# Patient Record
Sex: Male | Born: 1940 | Race: White | Hispanic: No | Marital: Married | Smoking: Current every day smoker
Health system: Southern US, Community
[De-identification: ages and names within clinical notes are randomized; demographics above are authoritative.]

## PROBLEM LIST (undated history)

## (undated) DIAGNOSIS — IMO0001 Reserved for inherently not codable concepts without codable children: Secondary | ICD-10-CM

## (undated) DIAGNOSIS — I10 Essential (primary) hypertension: Secondary | ICD-10-CM

## (undated) DIAGNOSIS — J449 Chronic obstructive pulmonary disease, unspecified: Secondary | ICD-10-CM

## (undated) DIAGNOSIS — C801 Malignant (primary) neoplasm, unspecified: Secondary | ICD-10-CM

## (undated) DIAGNOSIS — E119 Type 2 diabetes mellitus without complications: Secondary | ICD-10-CM

## (undated) HISTORY — PX: COLON SURGERY: SHX602

## (undated) HISTORY — PX: HERNIA MESH REMOVAL: SHX1745

---

## 2004-02-18 ENCOUNTER — Other Ambulatory Visit: Payer: Self-pay

## 2004-05-19 ENCOUNTER — Ambulatory Visit: Payer: Self-pay | Admitting: Internal Medicine

## 2004-05-20 ENCOUNTER — Ambulatory Visit: Payer: Self-pay | Admitting: Internal Medicine

## 2004-05-23 ENCOUNTER — Ambulatory Visit: Payer: Self-pay | Admitting: Internal Medicine

## 2009-12-08 ENCOUNTER — Ambulatory Visit: Payer: Self-pay | Admitting: Surgery

## 2009-12-13 ENCOUNTER — Ambulatory Visit: Payer: Self-pay | Admitting: Surgery

## 2012-10-20 LAB — URINALYSIS, COMPLETE
Bilirubin,UR: NEGATIVE
Glucose,UR: >=500 mg/dL (ref 0–75)
Nitrite: NEGATIVE
Ph: 6 (ref 4.5–8.0)
RBC,UR: 5 /HPF (ref 0–5)
Specific Gravity: 1.023 (ref 1.003–1.030)
WBC UR: 2 /HPF (ref 0–5)

## 2012-10-20 LAB — CBC
HGB: 16.2 g/dL (ref 13.0–18.0)
MCH: 34.6 pg — ABNORMAL HIGH (ref 26.0–34.0)
MCHC: 34.6 g/dL (ref 32.0–36.0)
MCV: 100 fL (ref 80–100)
Platelet: 298 10*3/uL (ref 150–440)
RBC: 4.68 10*6/uL (ref 4.40–5.90)
RDW: 13 % (ref 11.5–14.5)
WBC: 19.2 10*3/uL — ABNORMAL HIGH (ref 3.8–10.6)

## 2012-10-20 LAB — COMPREHENSIVE METABOLIC PANEL
BUN: 20 mg/dL — ABNORMAL HIGH (ref 7–18)
Bilirubin,Total: 0.9 mg/dL (ref 0.2–1.0)
Creatinine: 1.02 mg/dL (ref 0.60–1.30)
Glucose: 220 mg/dL — ABNORMAL HIGH (ref 65–99)
Osmolality: 270 (ref 275–301)
SGOT(AST): 20 U/L (ref 15–37)
SGPT (ALT): 24 U/L (ref 12–78)

## 2012-10-20 LAB — LIPASE, BLOOD: Lipase: 129 U/L (ref 73–393)

## 2012-10-21 ENCOUNTER — Inpatient Hospital Stay: Payer: Self-pay | Admitting: Internal Medicine

## 2012-10-21 LAB — CBC WITH DIFFERENTIAL/PLATELET
Basophil #: 0 10*3/uL (ref 0.0–0.1)
Basophil %: 0.2 %
Eosinophil #: 0 10*3/uL (ref 0.0–0.7)
Eosinophil %: 0 %
HCT: 42.5 % (ref 40.0–52.0)
HGB: 14.7 g/dL (ref 13.0–18.0)
Lymphocyte #: 1.5 10*3/uL (ref 1.0–3.6)
Lymphocyte %: 9 %
MCHC: 34.6 g/dL (ref 32.0–36.0)
Monocyte #: 1.7 x10 3/mm — ABNORMAL HIGH (ref 0.2–1.0)
Monocyte %: 9.8 %
RBC: 4.25 10*6/uL — ABNORMAL LOW (ref 4.40–5.90)

## 2012-10-21 LAB — BASIC METABOLIC PANEL
Anion Gap: 6 — ABNORMAL LOW (ref 7–16)
BUN: 20 mg/dL — ABNORMAL HIGH (ref 7–18)
Calcium, Total: 8.6 mg/dL (ref 8.5–10.1)
Chloride: 98 mmol/L (ref 98–107)
Co2: 29 mmol/L (ref 21–32)
EGFR (African American): >60
EGFR (Non-African Amer.): >60
Glucose: 179 mg/dL — ABNORMAL HIGH (ref 65–99)
Osmolality: 273 (ref 275–301)
Sodium: 133 mmol/L — ABNORMAL LOW (ref 136–145)

## 2012-10-21 LAB — PROTIME-INR: Prothrombin Time: 14.9 secs — ABNORMAL HIGH (ref 11.5–14.7)

## 2012-10-22 LAB — COMPREHENSIVE METABOLIC PANEL
Albumin: 3.5 g/dL (ref 3.4–5.0)
Alkaline Phosphatase: 65 U/L (ref 50–136)
Anion Gap: 9 (ref 7–16)
BUN: 16 mg/dL (ref 7–18)
Bilirubin,Total: 1.5 mg/dL — ABNORMAL HIGH (ref 0.2–1.0)
Calcium, Total: 8.5 mg/dL (ref 8.5–10.1)
Co2: 27 mmol/L (ref 21–32)
EGFR (African American): >60
EGFR (Non-African Amer.): >60
Osmolality: 269 (ref 275–301)
Potassium: 3.3 mmol/L — ABNORMAL LOW (ref 3.5–5.1)
SGOT(AST): 134 U/L — ABNORMAL HIGH (ref 15–37)
SGPT (ALT): 202 U/L — ABNORMAL HIGH (ref 12–78)
Total Protein: 6.7 g/dL (ref 6.4–8.2)

## 2012-10-22 LAB — CBC WITH DIFFERENTIAL/PLATELET
Basophil %: 0.3 %
Eosinophil %: 0.4 %
HGB: 13.6 g/dL (ref 13.0–18.0)
Lymphocyte #: 1 10*3/uL (ref 1.0–3.6)
MCV: 100 fL (ref 80–100)
Monocyte #: 1.2 x10 3/mm — ABNORMAL HIGH (ref 0.2–1.0)
Monocyte %: 13.6 %
Neutrophil %: 74.4 %
Platelet: 204 10*3/uL (ref 150–440)
RDW: 12.7 % (ref 11.5–14.5)

## 2012-10-24 LAB — CBC WITH DIFFERENTIAL/PLATELET
Basophil #: 0 10*3/uL (ref 0.0–0.1)
Basophil %: 0.2 %
Eosinophil #: 0 10*3/uL (ref 0.0–0.7)
Eosinophil %: 0.1 %
HGB: 14.5 g/dL (ref 13.0–18.0)
Lymphocyte %: 8.9 %
MCH: 33.7 pg (ref 26.0–34.0)
MCHC: 33.5 g/dL (ref 32.0–36.0)
MCV: 101 fL — ABNORMAL HIGH (ref 80–100)
Monocyte #: 0.9 x10 3/mm (ref 0.2–1.0)
Monocyte %: 7.9 %
Neutrophil #: 10 10*3/uL — ABNORMAL HIGH (ref 1.4–6.5)
RDW: 12.6 % (ref 11.5–14.5)
WBC: 12 10*3/uL — ABNORMAL HIGH (ref 3.8–10.6)

## 2012-10-24 LAB — BASIC METABOLIC PANEL
Anion Gap: 7 (ref 7–16)
BUN: 16 mg/dL (ref 7–18)
Chloride: 94 mmol/L — ABNORMAL LOW (ref 98–107)
Co2: 29 mmol/L (ref 21–32)
EGFR (African American): >60
EGFR (Non-African Amer.): >60
Glucose: 186 mg/dL — ABNORMAL HIGH (ref 65–99)
Osmolality: 267 (ref 275–301)
Potassium: 3.6 mmol/L (ref 3.5–5.1)
Sodium: 130 mmol/L — ABNORMAL LOW (ref 136–145)

## 2012-10-25 LAB — CBC WITH DIFFERENTIAL/PLATELET
HCT: 33.7 % — ABNORMAL LOW (ref 40.0–52.0)
MCHC: 33.9 g/dL (ref 32.0–36.0)
Monocyte #: 1.1 x10 3/mm — ABNORMAL HIGH (ref 0.2–1.0)
Neutrophil #: 7.9 10*3/uL — ABNORMAL HIGH (ref 1.4–6.5)
Platelet: 172 10*3/uL (ref 150–440)
RDW: 12.7 % (ref 11.5–14.5)
WBC: 10.3 10*3/uL (ref 3.8–10.6)

## 2012-10-25 LAB — BASIC METABOLIC PANEL
Chloride: 99 mmol/L (ref 98–107)
Creatinine: 1.29 mg/dL (ref 0.60–1.30)
Osmolality: 273 (ref 275–301)
Potassium: 3.6 mmol/L (ref 3.5–5.1)

## 2012-10-26 LAB — BASIC METABOLIC PANEL
Anion Gap: 6 — ABNORMAL LOW (ref 7–16)
Chloride: 99 mmol/L (ref 98–107)
EGFR (Non-African Amer.): >60
Glucose: 180 mg/dL — ABNORMAL HIGH (ref 65–99)
Potassium: 3.5 mmol/L (ref 3.5–5.1)
Sodium: 132 mmol/L — ABNORMAL LOW (ref 136–145)

## 2012-10-26 LAB — CBC WITH DIFFERENTIAL/PLATELET
Basophil #: 0 10*3/uL (ref 0.0–0.1)
Eosinophil #: 0.2 10*3/uL (ref 0.0–0.7)
HCT: 33.8 % — ABNORMAL LOW (ref 40.0–52.0)
HGB: 11.6 g/dL — ABNORMAL LOW (ref 13.0–18.0)
Lymphocyte %: 10.4 %
MCV: 100 fL (ref 80–100)
Monocyte %: 9.4 %
Neutrophil #: 8.2 10*3/uL — ABNORMAL HIGH (ref 1.4–6.5)
RBC: 3.38 10*6/uL — ABNORMAL LOW (ref 4.40–5.90)

## 2012-10-27 LAB — CBC WITH DIFFERENTIAL/PLATELET
Basophil #: 0 10*3/uL (ref 0.0–0.1)
Basophil %: 0.4 %
Eosinophil #: 0.1 10*3/uL (ref 0.0–0.7)
HCT: 31.9 % — ABNORMAL LOW (ref 40.0–52.0)
HGB: 10.9 g/dL — ABNORMAL LOW (ref 13.0–18.0)
Lymphocyte %: 16.4 %
MCHC: 34.3 g/dL (ref 32.0–36.0)
Monocyte #: 1 x10 3/mm (ref 0.2–1.0)
Monocyte %: 14 %
Neutrophil #: 5.1 10*3/uL (ref 1.4–6.5)
Neutrophil %: 68 %
Platelet: 191 10*3/uL (ref 150–440)
RBC: 3.22 10*6/uL — ABNORMAL LOW (ref 4.40–5.90)
RDW: 12.3 % (ref 11.5–14.5)
WBC: 7.5 10*3/uL (ref 3.8–10.6)

## 2012-10-27 LAB — BASIC METABOLIC PANEL
Anion Gap: 8 (ref 7–16)
Calcium, Total: 8 mg/dL — ABNORMAL LOW (ref 8.5–10.1)
Chloride: 94 mmol/L — ABNORMAL LOW (ref 98–107)
Glucose: 157 mg/dL — ABNORMAL HIGH (ref 65–99)
Osmolality: 265 (ref 275–301)
Potassium: 3.1 mmol/L — ABNORMAL LOW (ref 3.5–5.1)
Sodium: 131 mmol/L — ABNORMAL LOW (ref 136–145)

## 2013-04-09 ENCOUNTER — Ambulatory Visit: Payer: Self-pay | Admitting: Gastroenterology

## 2013-04-10 LAB — PATHOLOGY REPORT

## 2013-12-27 DIAGNOSIS — I10 Essential (primary) hypertension: Secondary | ICD-10-CM | POA: Diagnosis present

## 2013-12-27 DIAGNOSIS — J449 Chronic obstructive pulmonary disease, unspecified: Secondary | ICD-10-CM | POA: Diagnosis present

## 2013-12-27 DIAGNOSIS — E785 Hyperlipidemia, unspecified: Secondary | ICD-10-CM

## 2013-12-27 DIAGNOSIS — N181 Chronic kidney disease, stage 1: Secondary | ICD-10-CM

## 2013-12-27 DIAGNOSIS — E1169 Type 2 diabetes mellitus with other specified complication: Secondary | ICD-10-CM | POA: Diagnosis present

## 2013-12-27 DIAGNOSIS — E1122 Type 2 diabetes mellitus with diabetic chronic kidney disease: Secondary | ICD-10-CM | POA: Diagnosis present

## 2014-10-07 DIAGNOSIS — D510 Vitamin B12 deficiency anemia due to intrinsic factor deficiency: Secondary | ICD-10-CM | POA: Insufficient documentation

## 2014-11-13 NOTE — H&P (Signed)
PATIENT NAME:  Bryan Hicks, Bryan Hicks MR#:  [id number] DATE OF BIRTH:  1940-08-13  DATE OF ADMISSION:  10/21/2012  ATTENDING PHYSICIAN:  Dr. Marlyce Huge.  REASON FOR ADMISSION: Abdominal pain, nausea, and vomiting.   HISTORY OF PRESENT ILLNESS: The patient is a pleasant 74 year old male with a history of laparoscopic right inguinal hernia repair using a TAPP approach who presents with 2 days of diffuse abdominal pain. He says it began suddenly 2 days ago. He describes it as stabbing. He says that it has gotten worse, and nothing has made it better. Currently, however, he is having  1 out of 10 pain due to the fact that he got pain medicine. He also reports having 8 episodes of  nausea and vomiting; none since he has been here. His last BM was yesterday, which was normal. He also says he has subjective fevers. Has never had pain like this before. He does not feel that the pain is due to his vomiting, but did start around that time. Does not report any obvious right pain or obvious bulges. No chills, night sweats, shortness of breath, cough, chest pain, previous constipation, diarrhea, sick contacts, unusual ingestions. Did have what he said was a "regular colonoscopy" a couple of years ago.   PAST MEDICAL HISTORY: 1.  Hypertension.  2.  Diabetes mellitus.  3.  COPD.  4.  History of laparoscopic right inguinal hernia and umbilical hernia repair.   HOME MEDICINES: Are as follows: 1.  Metformin 1500 at bedtime.  2.  Lovastatin 20 mg p.o. daily.  3.  Lisinopril 80 mg p.o. at bedtime.  4.  Aspirin 81 daily.  5.  Advair Diskus 250/50 inhalation powder, 1 puff b.i.d.   ALLERGIES: BEE STINGS.   SOCIAL HISTORY: Smokes approximately 15 cigarettes a day. Has smoked since he was a young child. Does not drink. Is here with his wife and stepdaughter. Lives in [place].   FAMILY HISTORY: Denies family history of heart disease, diabetes, high blood pressure. Mother died of colon cancer at age 13.    REVIEW OF SYSTEMS:  Twelve-point review of systems review of systems was obtained. Pertinent positives and negatives as above.   PHYSICAL EXAMINATION: VITAL SIGNS: Temperature 98.7, pulse 80, blood pressure 180/84, respirations 18; 95% on room air.  GENERAL: No acute distress, alert and oriented x 3.  HEAD: Normocephalic, atraumatic.  EYES: No scleral icterus. No conjunctivitis.  FACE: No obvious facial trauma. Normal external nose. Normal external ears.  CHEST: Lungs clear to auscultation. Some expiratory wheezes. Moving air well.  HEART: Regular rate and rhythm. No murmurs, rubs, or gallops.  ABDOMEN: Soft. Mild tender, not distended per him, but I feel he is a little distended, as does his wife.  EXTREMITIES: Moves all extremities well. Strength 5/5.  NEUROLOGIC: Cranial nerves II-XII grossly intact. Sensation intact, all 4 extremities.   LABS: Are significant for a blood glucose of 220, sodium of 130, chloride of 95, bicarbonate of 25. LFTs are normal   White cell count is 19.2.   Urinalysis is negative for nitrites and leukocyte esterase. INR is 1.2. Lactic acid is 1.6.   CT abdomen shows no oral contrast was given. Does have dilated proximal loops of small bowel and distal collapse, per radiologist. Appears a change in caliber occurs in the pelvis.   ASSESSMENT AND PLAN: The patient is a pleasant 74 year old who presents with abdominal pain, nausea, vomiting and dilation on CT scan. He does not have a lactic acidosis, but his white  cell count is increased, likely a small bowel obstruction. He is relatively nontender and vital signs are normal at this time. Will admit for resuscitation, nasogastric decompression for nonoperative management of bowel obstruction. Will perform serial abdominal exams. No obvious emergent need for surgical intervention at this time, however I am concerned about leukocytosis.    ____________________________ Glena Norfolk Ryka Beighley,  MD cal:dm D: 10/21/2012 02:58:00 ET T: 10/21/2012 07:52:33 ET JOB#: [id number]  cc: Harrell Gave A. Johnathon Olden, MD, <Dictator> Floyde Parkins MD ELECTRONICALLY SIGNED 10/28/2012 10:02

## 2014-11-13 NOTE — Discharge Summary (Signed)
PATIENT NAME:  Bryan Hicks, Bryan Hicks MR#:  [id number] DATE OF BIRTH:  03-03-1941  DATE OF ADMISSION:  10/21/2012 DATE OF DISCHARGE:  10/29/2012  DIAGNOSES: Small bowel obstruction, hypertension, diabetes, chronic obstructive pulmonary disease.   CONSULTANTS: None.   PROCEDURES: Exploratory laparotomy and lysis of adhesions for small bowel obstruction.   HISTORY OF PRESENT ILLNESS AND HOSPITAL COURSE: This is a patient who was admitted to the hospital by Dr. Rexene Edison with a diagnosis of small bowel obstruction. He was treated conservatively with nasogastric tube and rehydration. However, he failed to progress and was ultimately taken to the Operating Room where lysis of adhesions was performed for a partial small bowel obstruction, which promptly resolved. His nasogastric tube was removed and he was discharged in stable condition on his regular home medications and analgesics to follow up in my office in 10 days.    ____________________________ Bryan Banana Burt Knack, MD rec:jm D: 11/06/2012 20:42:43 ET T: 11/06/2012 21:35:45 ET JOB#: [id number]  cc: Bryan Banana. Burt Knack, MD, <Dictator> Florene Glen MD ELECTRONICALLY SIGNED 11/06/2012 22:38

## 2014-11-13 NOTE — Op Note (Signed)
PATIENT NAME:  Bryan Hicks, Bryan Hicks MR#:  [id number] DATE OF BIRTH:  March 15, 1941  DATE OF PROCEDURE:  10/23/2012  PREOPERATIVE DIAGNOSIS: Small bowel obstruction.   POSTOPERATIVE DIAGNOSIS:   Small bowel obstruction.  PROCEDURE:  1.  Exploratory laparotomy. 2.  Lysis of adhesions.   SURGEON: Brad Lieurance E. Burt Knack, M.D.  ASSESSMENT:  Bryan Racer, PA-S  INDICATIONS: This is a patient with signs of a small bowel obstruction, which have not improved. He is offered surgery at this point, the rationale for surgery has been discussed. The options of continued observation have been reviewed and the risks of bleeding, infection, bowel resection, bowel injury, ostomy all been discussed with him and his family. They understood and agreed to proceed.   FINDINGS: Single adhesive band in the right lower quadrant near the right groin inguinal hernia repair but no obvious dense adhesions to the mesh or evidence of exposed mesh. The appendix appeared normal.   DESCRIPTION OF PROCEDURE: The patient was induced under general anesthesia, given IV antibiotics, VTE prophylaxis was in place. She was prepped and draped in a sterile fashion. A Foley catheter had been placed.  A midline incision was utilized to open and explore the abdominal cavity, fluid was clear and aspirated.  The dilated bowel was identified and run first through the ligament of Treitz and then distally to an area of constriction at the right lower quadrant.  In lifting the bowel out of the abdomen into the wound a small band was lysed, resulting in resolution of the small bowel obstruction. The area of stricture was found to be nonischemic and non-scarified and opened up immediately for liquid material to pass proximal to distal.   The bowel continued to be run down to the ileocecal valve. No further pathology was identified. The appendix was identified and found to be normal. The area of the adhesion had gone down into the pelvis near the previous  inguinal hernia repair. No sign of exposed mesh or further adhesions to mesh of small bowel.   The bowel was placed back into the abdominal cavity. N-G tube placement was confirmed in the stomach and then the bowel was run again. Again, there was no sign of permanent injury to the bowel and no sign of serosal rents, etc.  Therefore, the wound was then closed after placing a piece of Seprafilm with a #1 PDS. Marcaine was infiltrated in the skin and subcutaneous tissues for a total of 30 mL, and then skin staples were placed followed by a sterile dressing. The patient tolerated the procedure well. There were no complications. He was taken to the recovery room in stable condition to be admitted for continued care.    ____________________________ Jerrol Banana. Burt Knack, MD rec:ct D: 10/23/2012 13:26:08 ET T: 10/23/2012 13:33:40 ET JOB#: [id number]  cc: Jerrol Banana. Burt Knack, MD, <Dictator> Florene Glen MD ELECTRONICALLY SIGNED 10/23/2012 15:05

## 2014-11-26 ENCOUNTER — Encounter
Admission: RE | Admit: 2014-11-26 | Discharge: 2014-11-26 | Disposition: A | Discharge status: Home / Self Care | Payer: Medicare Other | Source: Ambulatory Visit | Attending: Surgery | Admitting: Surgery

## 2014-11-26 ENCOUNTER — Other Ambulatory Visit: Payer: Self-pay

## 2014-11-26 DIAGNOSIS — Z01812 Encounter for preprocedural laboratory examination: Secondary | ICD-10-CM | POA: Diagnosis not present

## 2014-11-26 DIAGNOSIS — K4090 Unilateral inguinal hernia, without obstruction or gangrene, not specified as recurrent: Secondary | ICD-10-CM | POA: Diagnosis not present

## 2014-11-26 DIAGNOSIS — Z01810 Encounter for preprocedural cardiovascular examination: Secondary | ICD-10-CM | POA: Insufficient documentation

## 2014-11-26 HISTORY — DX: Essential (primary) hypertension: I10

## 2014-11-26 HISTORY — DX: Chronic obstructive pulmonary disease, unspecified: J44.9

## 2014-11-26 LAB — CBC
HCT: 43 % (ref 40.0–52.0)
Hemoglobin: 14.2 g/dL (ref 13.0–18.0)
MCH: 33.3 pg (ref 26.0–34.0)
MCHC: 33 g/dL (ref 32.0–36.0)
MCV: 100.7 fL — ABNORMAL HIGH (ref 80.0–100.0)
Platelets: 224 10*3/uL (ref 150–440)
RBC: 4.27 MIL/uL — ABNORMAL LOW (ref 4.40–5.90)
RDW: 12.9 % (ref 11.5–14.5)
WBC: 8.1 10*3/uL (ref 3.8–10.6)

## 2014-11-26 LAB — DIFFERENTIAL
Basophils Absolute: 0.1 10*3/uL (ref 0–0.1)
Basophils Relative: 1 %
EOS PCT: 2 %
Eosinophils Absolute: 0.2 10*3/uL (ref 0–0.7)
LYMPHS PCT: 23 %
Lymphs Abs: 1.9 10*3/uL (ref 1.0–3.6)
MONO ABS: 0.9 10*3/uL (ref 0.2–1.0)
MONOS PCT: 11 %
NEUTROS ABS: 5.1 10*3/uL (ref 1.4–6.5)
Neutrophils Relative %: 63 %

## 2014-11-26 NOTE — Patient Instructions (Signed)
  Your procedure is scheduled on: 5/13/16Report to Day Surgery. To find out your arrival time please call [phone] between 1PM - 3PM on 12/03/14 Remember: Instructions that are not followed completely may result in serious medical risk, up to and including death, or upon the discretion of your surgeon and anesthesiologist your surgery may need to be rescheduled.    _x___ 1. Do not eat food or drink liquids after midnight. No gum chewing or hard candies.     __x__ 2. No Alcohol for 24 hours before or after surgery.   ____ 3. Bring all medications with you on the day of surgery if instructed.    __x__ 4. Notify your doctor if there is any change in your medical condition     (cold, fever, infections).     Do not wear jewelry, make-up, hairpins, clips or nail polish.  Do not wear lotions, powders, or perfumes. You may wear deodorant.  Do not shave 48 hours prior to surgery. Men may shave face and neck.  Do not bring valuables to the hospital.    [facility] is not responsible for any belongings or valuables.               Contacts, dentures or bridgework may not be worn into surgery.  Leave your suitcase in the car. After surgery it may be brought to your room.  For patients admitted to the hospital, discharge time is determined by your                treatment team.   Patients discharged the day of surgery will not be allowed to drive home.   Please read over the following fact sheets that you were given:      ____ Take these medicines the morning of surgery with A SIP OF WATER:    1.Fluticasone-Salmeterol (ADVAIR) 250-50 MCG/DOSE AEPB  2.   3.   4.  5.  6.  ____ Fleet Enema (as directed)   ____ Use CHG Soap as directed  ____ Use inhalers on the day of surgery  _x___ Stop metformin 2 days prior to surgery    ____ Take 1/2 of usual insulin dose the night before surgery and none on the morning of surgery.   _x___ Stop Coumadin/Plavix/aspirin on 7 days before  surgery ____ Stop Anti-inflammatories on  ____ Stop supplements until after surgery.    ____ Bring C-Pap to the hospital.

## 2014-12-04 ENCOUNTER — Ambulatory Visit: Payer: Medicare Other | Admitting: Anesthesiology

## 2014-12-04 ENCOUNTER — Ambulatory Visit
Admission: RE | Admit: 2014-12-04 | Discharge: 2014-12-04 | Disposition: A | Discharge status: Home / Self Care | Payer: Medicare Other | Source: Ambulatory Visit | Attending: Surgery | Admitting: Surgery

## 2014-12-04 ENCOUNTER — Encounter
Admission: RE | Disposition: A | Discharge status: Home / Self Care | Payer: Self-pay | Source: Ambulatory Visit | Attending: Surgery

## 2014-12-04 DIAGNOSIS — Z9889 Other specified postprocedural states: Secondary | ICD-10-CM | POA: Diagnosis not present

## 2014-12-04 DIAGNOSIS — D171 Benign lipomatous neoplasm of skin and subcutaneous tissue of trunk: Secondary | ICD-10-CM | POA: Diagnosis not present

## 2014-12-04 DIAGNOSIS — Z833 Family history of diabetes mellitus: Secondary | ICD-10-CM | POA: Diagnosis not present

## 2014-12-04 DIAGNOSIS — Z807 Family history of other malignant neoplasms of lymphoid, hematopoietic and related tissues: Secondary | ICD-10-CM | POA: Insufficient documentation

## 2014-12-04 DIAGNOSIS — N182 Chronic kidney disease, stage 2 (mild): Secondary | ICD-10-CM | POA: Insufficient documentation

## 2014-12-04 DIAGNOSIS — N529 Male erectile dysfunction, unspecified: Secondary | ICD-10-CM | POA: Diagnosis not present

## 2014-12-04 DIAGNOSIS — N433 Hydrocele, unspecified: Secondary | ICD-10-CM | POA: Diagnosis not present

## 2014-12-04 DIAGNOSIS — J449 Chronic obstructive pulmonary disease, unspecified: Secondary | ICD-10-CM | POA: Diagnosis not present

## 2014-12-04 DIAGNOSIS — Z79899 Other long term (current) drug therapy: Secondary | ICD-10-CM | POA: Insufficient documentation

## 2014-12-04 DIAGNOSIS — Z8249 Family history of ischemic heart disease and other diseases of the circulatory system: Secondary | ICD-10-CM | POA: Diagnosis not present

## 2014-12-04 DIAGNOSIS — K4090 Unilateral inguinal hernia, without obstruction or gangrene, not specified as recurrent: Secondary | ICD-10-CM | POA: Diagnosis present

## 2014-12-04 DIAGNOSIS — Z800 Family history of malignant neoplasm of digestive organs: Secondary | ICD-10-CM | POA: Insufficient documentation

## 2014-12-04 DIAGNOSIS — Z808 Family history of malignant neoplasm of other organs or systems: Secondary | ICD-10-CM | POA: Diagnosis not present

## 2014-12-04 DIAGNOSIS — Z823 Family history of stroke: Secondary | ICD-10-CM | POA: Insufficient documentation

## 2014-12-04 DIAGNOSIS — E785 Hyperlipidemia, unspecified: Secondary | ICD-10-CM | POA: Diagnosis not present

## 2014-12-04 DIAGNOSIS — D176 Benign lipomatous neoplasm of spermatic cord: Secondary | ICD-10-CM | POA: Insufficient documentation

## 2014-12-04 DIAGNOSIS — Z7982 Long term (current) use of aspirin: Secondary | ICD-10-CM | POA: Diagnosis not present

## 2014-12-04 DIAGNOSIS — I129 Hypertensive chronic kidney disease with stage 1 through stage 4 chronic kidney disease, or unspecified chronic kidney disease: Secondary | ICD-10-CM | POA: Diagnosis not present

## 2014-12-04 DIAGNOSIS — F17200 Nicotine dependence, unspecified, uncomplicated: Secondary | ICD-10-CM | POA: Diagnosis not present

## 2014-12-04 DIAGNOSIS — E119 Type 2 diabetes mellitus without complications: Secondary | ICD-10-CM | POA: Insufficient documentation

## 2014-12-04 DIAGNOSIS — Z91030 Bee allergy status: Secondary | ICD-10-CM | POA: Diagnosis not present

## 2014-12-04 DIAGNOSIS — E538 Deficiency of other specified B group vitamins: Secondary | ICD-10-CM | POA: Diagnosis not present

## 2014-12-04 HISTORY — PX: INGUINAL HERNIA REPAIR: SHX194

## 2014-12-04 HISTORY — DX: Reserved for inherently not codable concepts without codable children: IMO0001

## 2014-12-04 HISTORY — DX: Type 2 diabetes mellitus without complications: E11.9

## 2014-12-04 LAB — GLUCOSE, CAPILLARY: GLUCOSE-CAPILLARY: 148 mg/dL — AB (ref 65–99)

## 2014-12-04 SURGERY — REPAIR, HERNIA, INGUINAL, ADULT
Anesthesia: General | Laterality: Left

## 2014-12-04 MED ORDER — PHENYLEPHRINE HCL 10 MG/ML IJ SOLN
INTRAMUSCULAR | Status: DC | PRN
  Administered 2014-12-04 (×5): 50 ug via INTRAVENOUS

## 2014-12-04 MED ORDER — HYDROCODONE-ACETAMINOPHEN 5-325 MG PO TABS
ORAL_TABLET | ORAL | Status: AC
  Filled 2014-12-04: qty 1

## 2014-12-04 MED ORDER — FAMOTIDINE 20 MG PO TABS
ORAL_TABLET | ORAL | Status: AC
Start: 2014-12-04 — End: 2014-12-04
  Administered 2014-12-04: 20 mg via ORAL
  Filled 2014-12-04: qty 1

## 2014-12-04 MED ORDER — ONDANSETRON HCL 4 MG/2ML IJ SOLN: 4.0000 mg | Freq: Once | INTRAMUSCULAR | Status: DC | PRN

## 2014-12-04 MED ORDER — SODIUM CHLORIDE 0.9 % IV SOLN
INTRAVENOUS | Status: DC
  Administered 2014-12-04 (×2): via INTRAVENOUS

## 2014-12-04 MED ORDER — LIDOCAINE HCL (CARDIAC) 20 MG/ML IV SOLN
INTRAVENOUS | Status: DC | PRN
  Administered 2014-12-04: 60 mg via INTRAVENOUS

## 2014-12-04 MED ORDER — CEFAZOLIN SODIUM 1-5 GM-% IV SOLN
INTRAVENOUS | Status: AC
Start: 2014-12-04 — End: 2014-12-04
  Administered 2014-12-04: 1 g via INTRAVENOUS
  Filled 2014-12-04: qty 50

## 2014-12-04 MED ORDER — ONDANSETRON HCL 4 MG/2ML IJ SOLN
INTRAMUSCULAR | Status: DC | PRN
  Administered 2014-12-04: 4 mg via INTRAVENOUS

## 2014-12-04 MED ORDER — BUPIVACAINE-EPINEPHRINE (PF) 0.5% -1:200000 IJ SOLN
INTRAMUSCULAR | Status: DC | PRN
  Administered 2014-12-04: 16 mL

## 2014-12-04 MED ORDER — FENTANYL CITRATE (PF) 100 MCG/2ML IJ SOLN
INTRAMUSCULAR | Status: AC
  Administered 2014-12-04: 25 ug via INTRAVENOUS
  Filled 2014-12-04: qty 2

## 2014-12-04 MED ORDER — BUPIVACAINE-EPINEPHRINE (PF) 0.5% -1:200000 IJ SOLN
INTRAMUSCULAR | Status: AC
  Filled 2014-12-04: qty 30

## 2014-12-04 MED ORDER — FENTANYL CITRATE (PF) 100 MCG/2ML IJ SOLN
25.0000 ug | INTRAMUSCULAR | Status: AC | PRN
  Administered 2014-12-04 (×4): 25 ug via INTRAVENOUS

## 2014-12-04 MED ORDER — FENTANYL CITRATE (PF) 100 MCG/2ML IJ SOLN
INTRAMUSCULAR | Status: DC | PRN
  Administered 2014-12-04: 50 ug via INTRAVENOUS
  Administered 2014-12-04 (×2): 25 ug via INTRAVENOUS

## 2014-12-04 MED ORDER — PROPOFOL 10 MG/ML IV BOLUS
INTRAVENOUS | Status: DC | PRN
  Administered 2014-12-04: 150 mg via INTRAVENOUS

## 2014-12-04 MED ORDER — FAMOTIDINE 20 MG PO TABS
20.0000 mg | ORAL_TABLET | Freq: Once | ORAL | Status: AC
  Administered 2014-12-04: 20 mg via ORAL

## 2014-12-04 MED ORDER — CEFAZOLIN SODIUM 1-5 GM-% IV SOLN
1.0000 g | Freq: Once | INTRAVENOUS | Status: AC
  Administered 2014-12-04: 1 g via INTRAVENOUS

## 2014-12-04 MED ORDER — HYDROCODONE-ACETAMINOPHEN 5-325 MG PO TABS
1.0000 | ORAL_TABLET | ORAL | Status: DC | PRN
Start: 2014-12-04 — End: 2014-12-04
  Administered 2014-12-04: 1 via ORAL

## 2014-12-04 SURGICAL SUPPLY — 24 items
BLADE SURG 15 STRL LF DISP TIS (BLADE) ×1 IMPLANT
BLADE SURG 15 STRL SS (BLADE) ×2
CANISTER SUCT 1200ML W/VALVE (MISCELLANEOUS) ×3 IMPLANT
CHLORAPREP W/TINT 26ML (MISCELLANEOUS) ×3 IMPLANT
DRAIN PENROSE 5/8X18 LTX STRL (WOUND CARE) ×3 IMPLANT
DRAPE PED LAPAROTOMY (DRAPES) ×3 IMPLANT
GLOVE BIO SURGEON STRL SZ7.5 (GLOVE) ×3 IMPLANT
GOWN STRL REUS W/ TWL LRG LVL3 (GOWN DISPOSABLE) ×3 IMPLANT
GOWN STRL REUS W/TWL LRG LVL3 (GOWN DISPOSABLE) ×6
KIT RM TURNOVER STRD PROC AR (KITS) ×3 IMPLANT
LABEL OR SOLS (LABEL) ×3 IMPLANT
LIQUID BAND (GAUZE/BANDAGES/DRESSINGS) ×3 IMPLANT
MESH SYNTHETIC 4X6 SOFT BARD (Mesh General) ×1 IMPLANT
MESH SYNTHETIC SOFT BARD 4X6 (Mesh General) ×2 IMPLANT
NDL SAFETY 25GX1.5 (NEEDLE) ×3 IMPLANT
NS IRRIG 500ML POUR BTL (IV SOLUTION) ×3 IMPLANT
PACK BASIN MINOR ARMC (MISCELLANEOUS) ×3 IMPLANT
PAD GROUND ADULT SPLIT (MISCELLANEOUS) ×3 IMPLANT
SUT CHROMIC 4 0 RB 1X27 (SUTURE) ×3 IMPLANT
SUT MNCRL AB 4-0 PS2 18 (SUTURE) ×3 IMPLANT
SUT SURGILON 0 30 BLK (SUTURE) ×9 IMPLANT
SUT VIC AB 4-0 SH 27 (SUTURE) ×4
SUT VIC AB 4-0 SH 27XANBCTRL (SUTURE) ×2 IMPLANT
SYRINGE 10CC LL (SYRINGE) ×3 IMPLANT

## 2014-12-04 NOTE — Op Note (Signed)
OPERATIVE REPORT  PREOPERATIVE DIAGNOSIS: left inguinal hernia  POSTOPERATIVE DIAGNOSIS:left  inguinal hernia  PROCEDURE:  left inguinal hernia repair  ANESTHESIA:  General  SURGEON:  Rochel Brome M.D.  INDICATIONS:  With the patient on the operating table in the supine position the left lower quadrant was prepared with clippers and with ChloraPrep and draped in a sterile manner. A transversely oriented suprapubic incision was made and carried down through subcutaneous tissues. Electrocautery was used for hemostasis. The Scarpa's fascia was incised. The external oblique aponeurosis was incised along the course of its fibers to open the external ring and expose the inguinal cord structures. The cord structures were mobilized. A Penrose drain was passed around the cord structures for traction. Cremaster fibers were spread to expose an indirect hernia sac which was dissected free from surrounding structures up into the internal ring. The sac was opened. Its continuity with the peritoneal cavity was demonstrated. The patient of the sac was done with a 4-0 Vicryl suture ligature. A cord lipoma was dissected free from surrounding structures and ligated with 4-0 Vicryl suture ligature. Back and the lipoma were not sent for pathology. The floor of the inguinal canal was repaired with a roll of 0 Surgilon sutures. The conjoined tendon was sutured to the shelving edge of the inguinal ligament incorporating transversalis fascia into the repair. The last stitch led to satisfactory narrowing of the internal ring.Bard soft mesh was cut to create an oval shape and was placed over the repair. This was sutured to the repair with interrupted 0 Surgilon sutures and also sutured medially to the deep fascia and on both sides of the internal ring. Next after seeing hemostasis was intact the arch structures were replaced along the floor of the inguinal canal. The cut edges of the external oblique aponeurosis were closed with  a running 4-0 Vicryl suture to re-create the external ring. The deep fascia superior and lateral to the repair site was infiltrated with half percent Sensorcaine with epinephrine. Subcutaneous tissues were also infiltrated. The Scarpa's fascia was closed with interrupted 4-0 Vicryl sutures. The skin was closed with running 4-0 Monocryl subcuticular suture and LiquiBand. The testicle remained in the scrotum  The patient appeared to be in satisfactory condition and was prepared for transfer to the recovery room.  Rochel Brome M.D.

## 2014-12-04 NOTE — H&P (Signed)
  He reports no change in condition since office visit.  Site marked YES  12-04-14

## 2014-12-04 NOTE — Transfer of Care (Signed)
Immediate Anesthesia Transfer of Care Note  Patient: Bryan Hicks  Procedure(s) Performed: Procedure(s): HERNIA REPAIR INGUINAL ADULT (Left)  Patient Location: PACU  Anesthesia Type:General  Level of Consciousness: sedated  Airway & Oxygen Therapy: Patient Spontanous Breathing and Patient connected to face mask oxygen  Post-op Assessment: Report given to RN and Post -op Vital signs reviewed and stable  Post vital signs: Reviewed and stable  Last Vitals:  Filed Vitals:   12/04/14 1727  BP: 117/68  Pulse: 75  Temp: 36.6 C  Resp: 10    Complications: No apparent anesthesia complications

## 2014-12-04 NOTE — Progress Notes (Signed)
Dentures returned to patient in PACU

## 2014-12-04 NOTE — Discharge Instructions (Addendum)
May resume aspirin on Monday.  Take Tylenol 325 mg 1-2 tablets each 4 hours as needed for minor pain.  Take Norco one to 2 tablets H4 hours as needed for more pain.  May shower. Avoid straining and heavy lifting for 1 month.

## 2014-12-04 NOTE — Anesthesia Procedure Notes (Signed)
Procedure Name: LMA Insertion Date/Time: 12/04/2014 4:12 PM Performed by: Dionne Bucy Patient Re-evaluated:Patient Re-evaluated prior to inductionOxygen Delivery Method: Circle system utilized Preoxygenation: Pre-oxygenation with 100% oxygen Intubation Type: IV induction Ventilation: Mask ventilation without difficulty LMA Size: 5.0 Number of attempts: 1 Placement Confirmation: positive ETCO2 and breath sounds checked- equal and bilateral Tube secured with: Tape Dental Injury: Teeth and Oropharynx as per pre-operative assessment

## 2014-12-04 NOTE — Anesthesia Postprocedure Evaluation (Signed)
  Anesthesia Post-op Note  Patient: Bryan Hicks  Procedure(s) Performed: Procedure(s): HERNIA REPAIR INGUINAL ADULT (Left)  Anesthesia type:General  Patient location: PACU  Post pain: Pain level controlled  Post assessment: Post-op Vital signs reviewed, Patient's Cardiovascular Status Stable, Respiratory Function Stable, Patent Airway and No signs of Nausea or vomiting  Post vital signs: Reviewed and stable  Last Vitals:  Filed Vitals:   12/04/14 1815  BP:   Pulse: 67  Temp: 36.2 C  Resp: 11    Level of consciousness: awake, alert  and patient cooperative  Complications: No apparent anesthesia complications

## 2014-12-04 NOTE — Anesthesia Preprocedure Evaluation (Addendum)
Anesthesia Evaluation  Patient identified by MRN, date of birth, ID band Patient awake    Reviewed: Allergy & Precautions, NPO status , Patient's Chart, lab work & pertinent test results, reviewed documented beta blocker date and time   Airway Mallampati: II  TM Distance: >3 FB Neck ROM: Full    Dental  (+) Upper Dentures, Lower Dentures   Pulmonary shortness of breath and with exertion, COPD COPD inhaler, Current Smoker,  breath sounds clear to auscultation  Pulmonary exam normal       Cardiovascular hypertension, Normal cardiovascular examRhythm:Regular Rate:Normal     Neuro/Psych    GI/Hepatic Neg liver ROS, hernia   Endo/Other  diabetes, Well Controlled, Type 2, Oral Hypoglycemic Agents  Renal/GU negative Renal ROS  negative genitourinary   Musculoskeletal negative musculoskeletal ROS (+)   Abdominal Normal abdominal exam  (+)   Peds negative pediatric ROS (+)  Hematology negative hematology ROS (+)   Anesthesia Other Findings   Reproductive/Obstetrics negative OB ROS                          Anesthesia Physical Anesthesia Plan  ASA: II  Anesthesia Plan: General   Post-op Pain Management:    Induction: Intravenous  Airway Management Planned: Oral ETT  Additional Equipment:   Intra-op Plan:   Post-operative Plan: Extubation in OR  Informed Consent: I have reviewed the patients History and Physical, chart, labs and discussed the procedure including the risks, benefits and alternatives for the proposed anesthesia with the patient or authorized representative who has indicated his/her understanding and acceptance.   Dental advisory given  Plan Discussed with: CRNA and Surgeon  Anesthesia Plan Comments:        Anesthesia Quick Evaluation

## 2014-12-06 ENCOUNTER — Encounter: Payer: Self-pay | Admitting: Surgery

## 2014-12-07 LAB — GLUCOSE, CAPILLARY: Glucose-Capillary: 101 mg/dL — ABNORMAL HIGH (ref 65–99)

## 2018-01-08 DIAGNOSIS — E441 Mild protein-calorie malnutrition: Secondary | ICD-10-CM | POA: Diagnosis present

## 2018-04-25 ENCOUNTER — Emergency Department: Payer: Medicare Other

## 2018-04-25 ENCOUNTER — Inpatient Hospital Stay
Admission: EM | Admit: 2018-04-25 | Discharge: 2018-04-27 | DRG: 438 | Disposition: A | Discharge status: Home / Self Care | Payer: Medicare Other | Attending: Specialist | Admitting: Specialist

## 2018-04-25 ENCOUNTER — Other Ambulatory Visit: Payer: Self-pay

## 2018-04-25 ENCOUNTER — Other Ambulatory Visit: Payer: Self-pay | Admitting: Internal Medicine

## 2018-04-25 DIAGNOSIS — E876 Hypokalemia: Secondary | ICD-10-CM | POA: Diagnosis present

## 2018-04-25 DIAGNOSIS — K831 Obstruction of bile duct: Secondary | ICD-10-CM | POA: Diagnosis present

## 2018-04-25 DIAGNOSIS — K8689 Other specified diseases of pancreas: Secondary | ICD-10-CM | POA: Diagnosis present

## 2018-04-25 DIAGNOSIS — K838 Other specified diseases of biliary tract: Secondary | ICD-10-CM

## 2018-04-25 DIAGNOSIS — E871 Hypo-osmolality and hyponatremia: Secondary | ICD-10-CM | POA: Diagnosis present

## 2018-04-25 DIAGNOSIS — F17210 Nicotine dependence, cigarettes, uncomplicated: Secondary | ICD-10-CM | POA: Diagnosis present

## 2018-04-25 DIAGNOSIS — J449 Chronic obstructive pulmonary disease, unspecified: Secondary | ICD-10-CM | POA: Diagnosis present

## 2018-04-25 DIAGNOSIS — E441 Mild protein-calorie malnutrition: Secondary | ICD-10-CM | POA: Diagnosis present

## 2018-04-25 DIAGNOSIS — R634 Abnormal weight loss: Secondary | ICD-10-CM

## 2018-04-25 DIAGNOSIS — Z6820 Body mass index (BMI) 20.0-20.9, adult: Secondary | ICD-10-CM

## 2018-04-25 DIAGNOSIS — E162 Hypoglycemia, unspecified: Secondary | ICD-10-CM

## 2018-04-25 DIAGNOSIS — R17 Unspecified jaundice: Secondary | ICD-10-CM

## 2018-04-25 DIAGNOSIS — E1169 Type 2 diabetes mellitus with other specified complication: Secondary | ICD-10-CM | POA: Diagnosis present

## 2018-04-25 DIAGNOSIS — R609 Edema, unspecified: Secondary | ICD-10-CM

## 2018-04-25 DIAGNOSIS — Z79899 Other long term (current) drug therapy: Secondary | ICD-10-CM

## 2018-04-25 DIAGNOSIS — E11649 Type 2 diabetes mellitus with hypoglycemia without coma: Secondary | ICD-10-CM | POA: Diagnosis present

## 2018-04-25 DIAGNOSIS — I129 Hypertensive chronic kidney disease with stage 1 through stage 4 chronic kidney disease, or unspecified chronic kidney disease: Secondary | ICD-10-CM | POA: Diagnosis present

## 2018-04-25 DIAGNOSIS — Z794 Long term (current) use of insulin: Secondary | ICD-10-CM

## 2018-04-25 DIAGNOSIS — D490 Neoplasm of unspecified behavior of digestive system: Secondary | ICD-10-CM

## 2018-04-25 DIAGNOSIS — N181 Chronic kidney disease, stage 1: Secondary | ICD-10-CM | POA: Diagnosis present

## 2018-04-25 DIAGNOSIS — E785 Hyperlipidemia, unspecified: Secondary | ICD-10-CM | POA: Diagnosis present

## 2018-04-25 DIAGNOSIS — D72829 Elevated white blood cell count, unspecified: Secondary | ICD-10-CM | POA: Diagnosis present

## 2018-04-25 DIAGNOSIS — I10 Essential (primary) hypertension: Secondary | ICD-10-CM | POA: Diagnosis present

## 2018-04-25 DIAGNOSIS — D649 Anemia, unspecified: Secondary | ICD-10-CM

## 2018-04-25 DIAGNOSIS — E1122 Type 2 diabetes mellitus with diabetic chronic kidney disease: Secondary | ICD-10-CM | POA: Diagnosis present

## 2018-04-25 DIAGNOSIS — E43 Unspecified severe protein-calorie malnutrition: Secondary | ICD-10-CM | POA: Diagnosis present

## 2018-04-25 DIAGNOSIS — R188 Other ascites: Secondary | ICD-10-CM

## 2018-04-25 DIAGNOSIS — Z7982 Long term (current) use of aspirin: Secondary | ICD-10-CM

## 2018-04-25 LAB — CBC
HEMATOCRIT: 25.2 % — AB (ref 40.0–52.0)
HEMOGLOBIN: 9.1 g/dL — AB (ref 13.0–18.0)
MCH: 37.3 pg — ABNORMAL HIGH (ref 26.0–34.0)
MCHC: 36.1 g/dL — ABNORMAL HIGH (ref 32.0–36.0)
MCV: 103.3 fL — ABNORMAL HIGH (ref 80.0–100.0)
Platelets: 362 10*3/uL (ref 150–440)
RBC: 2.44 MIL/uL — AB (ref 4.40–5.90)
RDW: 16.7 % — ABNORMAL HIGH (ref 11.5–14.5)
WBC: 12.4 10*3/uL — AB (ref 3.8–10.6)

## 2018-04-25 LAB — COMPREHENSIVE METABOLIC PANEL
ALBUMIN: 2.4 g/dL — AB (ref 3.5–5.0)
ALK PHOS: 1507 U/L — AB (ref 38–126)
ALT: 165 U/L — AB (ref 0–44)
AST: 178 U/L — AB (ref 15–41)
Anion gap: 11 (ref 5–15)
BILIRUBIN TOTAL: 16 mg/dL — AB (ref 0.3–1.2)
BUN: 10 mg/dL (ref 8–23)
CALCIUM: 8.2 mg/dL — AB (ref 8.9–10.3)
CO2: 26 mmol/L (ref 22–32)
CREATININE: 0.42 mg/dL — AB (ref 0.61–1.24)
Chloride: 90 mmol/L — ABNORMAL LOW (ref 98–111)
GFR calc Af Amer: >60 mL/min (ref >60)
GFR calc non Af Amer: >60 mL/min (ref >60)
GLUCOSE: 87 mg/dL (ref 70–99)
Potassium: 3.1 mmol/L — ABNORMAL LOW (ref 3.5–5.1)
SODIUM: 127 mmol/L — AB (ref 135–145)
TOTAL PROTEIN: 6 g/dL — AB (ref 6.5–8.1)

## 2018-04-25 LAB — PROTIME-INR
INR: 1.58
Prothrombin Time: 18.7 seconds — ABNORMAL HIGH (ref 11.4–15.2)

## 2018-04-25 LAB — GLUCOSE, CAPILLARY
GLUCOSE-CAPILLARY: 85 mg/dL (ref 70–99)
GLUCOSE-CAPILLARY: 97 mg/dL (ref 70–99)
Glucose-Capillary: 111 mg/dL — ABNORMAL HIGH (ref 70–99)
Glucose-Capillary: 77 mg/dL (ref 70–99)

## 2018-04-25 LAB — LIPASE, BLOOD: LIPASE: 144 U/L — AB (ref 11–51)

## 2018-04-25 LAB — APTT: APTT: 33 s (ref 24–36)

## 2018-04-25 MED ORDER — DEXTROSE 50 % IV SOLN
0.5000 | Freq: Once | INTRAVENOUS | Status: AC
  Administered 2018-04-25: 25 mL via INTRAVENOUS
  Filled 2018-04-25: qty 50

## 2018-04-25 MED ORDER — IOPAMIDOL (ISOVUE-300) INJECTION 61%
100.0000 mL | Freq: Once | INTRAVENOUS | Status: AC | PRN
  Administered 2018-04-25: 100 mL via INTRAVENOUS

## 2018-04-25 MED ORDER — SODIUM CHLORIDE 0.9 % IV BOLUS
1000.0000 mL | Freq: Once | INTRAVENOUS | Status: AC
  Administered 2018-04-25: 1000 mL via INTRAVENOUS

## 2018-04-25 MED ORDER — POTASSIUM CHLORIDE 10 MEQ/100ML IV SOLN
10.0000 meq | INTRAVENOUS | Status: AC
  Administered 2018-04-26 (×4): 10 meq via INTRAVENOUS
  Filled 2018-04-25 (×4): qty 100

## 2018-04-25 NOTE — ED Notes (Signed)
Patient transported to CT 

## 2018-04-25 NOTE — ED Triage Notes (Signed)
Pt arrived via [facility] from home with c/o low blood sugar at home of 54. Ems states that when they arrived on scene pt BS was 45. On arrival, BS was 8. EMS states that pt has scheduled ultrasound tomorrow.

## 2018-04-25 NOTE — ED Provider Notes (Addendum)
[facility] Emergency Department Provider Note  ____________________________________________  Time seen: Approximately 9:08 PM  I have reviewed the triage vital signs and the nursing notes.   HISTORY  Chief Complaint Hypoglycemia    HPI Bryan Hicks is a 77 y.o. male with a history of HTN, DM and COPD, ongoing tobacco abuse, presenting for hypoglycemia.  The patient reports that for the past 2 weeks, he has had generalized fatigue and weakness.  He saw his primary care physician today, who noticed his severe jaundice and ordered a CT abdomen for tomorrow.  Upon arriving home, the patient was tremulous so EMS was called and he was found to have a blood sugar of 45.  He was given dextrose in route with an improved blood sugar to greater than 100.  The patient denies any nausea vomiting or diarrhea, and did not notice his skin changes.  Past Medical History:  Diagnosis Date  . COPD (chronic obstructive pulmonary disease) ([place])   . Diabetes mellitus without complication ([place])   . Hypertension   . Shortness of breath dyspnea     There are no active problems to display for this patient.   Past Surgical History:  Procedure Laterality Date  . COLON SURGERY     "twisted intestine"  . HERNIA MESH REMOVAL Right   . INGUINAL HERNIA REPAIR Left 12/04/2014   Procedure: HERNIA REPAIR INGUINAL ADULT;  Surgeon: Rochel Brome, MD;  Location: [facility] ORS;  Service: General;  Laterality: Left;    Current Outpatient Rx  . Order #: [id number] Class: Historical Med  . Order #: [id number] Class: Historical Med  . Order #: [id number] Class: Historical Med  . Order #: [id number] Class: Historical Med  . Order #: [id number] Class: Historical Med  . Order #: [id number] Class: Historical Med    Allergies Patient has no known allergies.  History reviewed. No pertinent family history.  Social History Social History   Tobacco Use  . Smoking status: Current Every Day Smoker     Packs/day: 1.00    Years: 30.00    Pack years: 30.00  . Smokeless tobacco: Current User  Substance Use Topics  . Alcohol use: No  . Drug use: No    Review of Systems Constitutional: No fever/chills.  + general malaise.  No lightheadedness or syncope. Eyes: No visual changes.  Positive scleral icterus. ENT: No sore throat. No congestion or rhinorrhea. Cardiovascular: Denies chest pain. Denies palpitations. Respiratory: Denies shortness of breath.  No cough. Gastrointestinal: No abdominal pain.  No nausea, no vomiting.  No diarrhea.  No constipation. Genitourinary: Negative for dysuria. Musculoskeletal: Negative for back pain. Skin: Negative for rash. Neurological: Negative for headaches. No focal numbness, tingling or weakness.     ____________________________________________   PHYSICAL EXAM:  VITAL SIGNS: ED Triage Vitals  Enc Vitals Group     BP 04/25/18 2102 124/62     Pulse Rate 04/25/18 2102 100     Resp 04/25/18 2102 18     Temp 04/25/18 2102 99.6 F (37.6 C)     Temp Source 04/25/18 2102 Oral     SpO2 04/25/18 2102 100 %     Weight 04/25/18 2105 137 lb (62.1 kg)     Height 04/25/18 2105 5\' 10"  (1.778 m)     Head Circumference --      Peak Flow --      Pain Score 04/25/18 2105 0     Pain Loc --      Pain Edu? --  Excl. in [place]? --     Constitutional: Alert and oriented.  Answers questions appropriately.  The patient is grossly jaundiced with scleral icterus and temporal wasting. Eyes: Conjunctivae are normal.  EOMI. No scleral icterus. Head: Atraumatic. Nose: No congestion/rhinnorhea. Mouth/Throat: Mucous membranes are moist.  Neck: No stridor.  Supple.   Cardiovascular: Fast rate, regular rhythm. No murmurs, rubs or gallops.  Respiratory: Normal respiratory effort.  No accessory muscle use or retractions. Lungs CTAB.  No wheezes, rales or ronchi. Gastrointestinal: Soft, nontender and nondistended.  No guarding or rebound.  No peritoneal signs. GU: No  external or palpable internal hemorrhoids.  Stool is brown and guaiac negative. Musculoskeletal: No LE edema. No ttp in the calves or palpable cords.  Negative Homan's sign. Neurologic:  A&Ox3.  Speech is clear.  Face and smile are symmetric.  EOMI.  Moves all extremities well. Skin:  Skin is warm, dry and intact. No rash noted. Psychiatric: Mood and affect are normal.  ____________________________________________   LABS (all labs ordered are listed, but only abnormal results are displayed)  Labs Reviewed  CBC - Abnormal; Notable for the following components:      Result Value   WBC 12.4 (*)    RBC 2.44 (*)    Hemoglobin 9.1 (*)    HCT 25.2 (*)    MCV 103.3 (*)    MCH 37.3 (*)    MCHC 36.1 (*)    RDW 16.7 (*)    All other components within normal limits  COMPREHENSIVE METABOLIC PANEL - Abnormal; Notable for the following components:   Sodium 127 (*)    Potassium 3.1 (*)    Chloride 90 (*)    Creatinine, Ser 0.42 (*)    Calcium 8.2 (*)    Total Protein 6.0 (*)    Albumin 2.4 (*)    AST 178 (*)    ALT 165 (*)    Alkaline Phosphatase 1,507 (*)    Total Bilirubin 16.0 (*)    All other components within normal limits  BLOOD GAS, VENOUS - Abnormal; Notable for the following components:   pH, Ven 7.46 (*)    pCO2, Ven 40 (*)    Bicarbonate 28.4 (*)    Acid-Base Excess 4.2 (*)    All other components within normal limits  LIPASE, BLOOD - Abnormal; Notable for the following components:   Lipase 144 (*)    All other components within normal limits  GLUCOSE, CAPILLARY - Abnormal; Notable for the following components:   Glucose-Capillary 111 (*)    All other components within normal limits  GLUCOSE, CAPILLARY  GLUCOSE, CAPILLARY  GLUCOSE, CAPILLARY  URINALYSIS, COMPLETE (UACMP) WITH MICROSCOPIC  PROTIME-INR  APTT   ____________________________________________  EKG  ED ECG REPORT I, Anne-Caroline Mariea Clonts, the attending physician, personally viewed and interpreted this  ECG.   Date: 04/25/2018  EKG Time: 2104  Rate: 100  Rhythm: sinus tachycardia; + PVC  Axis: normal  Intervals:none  ST&T Change: No STEMI  ____________________________________________  RADIOLOGY  Ct Abdomen Pelvis W Contrast  Result Date: 04/25/2018 CLINICAL DATA:  Acute abdominal pain, hypoglycemia EXAM: CT ABDOMEN AND PELVIS WITH CONTRAST TECHNIQUE: Multidetector CT imaging of the abdomen and pelvis was performed using the standard protocol following bolus administration of intravenous contrast. CONTRAST:  147mL ISOVUE-300 IOPAMIDOL (ISOVUE-300) INJECTION 61% COMPARISON:  10/21/2012 FINDINGS: Lower chest: Mild right lower lobe atelectasis. Hepatobiliary: Liver is within normal limits. Moderate intrahepatic and extrahepatic ductal dilatation. Common duct measures up to 2.4 cm (coronal image 40) with  extrinsic compression by a dominant cystic/necrotic lesion in the porta hepatitis. Gallbladder is unremarkable. No intrahepatic or extrahepatic ductal dilatation. Pancreas: 2.2 x 2.9 cm hypoenhancing lesion in the pancreatic head (series 2/image 36), highly suspicious for pancreatic neoplasm. Severe dilatation of the main pancreatic duct in the body/tail (series 2/image 27). Associated pancreatic atrophy. Peripancreatic fluid collections with enhancement along the anterior aspect of the pancreatic body/tail (series 2/image 27). Additional 4.1 x 3.8 cm cystic/necrotic lesion in the region of the pancreatic head, with mass effect on the common duct. Spleen: Within normal limits. Adrenals/Urinary Tract: Bilateral adrenal nodules, measuring up to 2.7 cm on the left,similar to 2014 and likely reflecting benign adrenal adenomas. Kidneys are within normal limits.  No hydronephrosis. Bladder is grossly unremarkable. Stomach/Bowel: Stomach is notable for a tiny hiatal hernia. Visualized bowel is grossly unremarkable. Appendix is not discretely visualized. Vascular/Lymphatic: No evidence of abdominal aortic  aneurysm. No vascular involvement. Atherosclerotic calcifications of the abdominal aorta and branch vessels. No suspicious abdominopelvic lymphadenopathy. Reproductive: Prostate is grossly unremarkable. Other: Moderate abdominopelvic ascites. Postsurgical changes related to prior right ventral hernia mesh repair. Musculoskeletal: Grade 1 anterolisthesis at L5-S1. IMPRESSION: 2.2 x 2.9 cm hypoenhancing lesion in the pancreatic head, highly suspicious for pancreatic neoplasm. Associated double duct sign with intrahepatic/extrahepatic ductal dilatation and dilatation of the pancreatic duct. Associated atrophy of the pancreatic body/tail. Associated peripancreatic fluid collections versus necrotic lesions, including a dominant 4.1 x 3.8 cm cystic lesion superior to the pancreatic head, with mass effect on the distal common duct. Moderate abdominopelvic ascites. Additional ancillary findings as above. Electronically Signed   By: Julian Hy M.D.   On: 04/25/2018 23:01    ____________________________________________   PROCEDURES  Procedure(s) performed: None  Procedures  Critical Care performed: Yes, see critical care note(s) ____________________________________________   INITIAL IMPRESSION / ASSESSMENT AND PLAN / ED COURSE  Pertinent labs & imaging results that were available during my care of the patient were reviewed by me and considered in my medical decision making (see chart for details).  77 y.o. male with several weeks of general malaise, jaundice, presenting for tremulousness in the setting of hypoglycemia.  Overall, the patient is tachycardic to the 120s but otherwise afebrile with a reassuring blood pressure.  Given his severe jaundice and scleral icterus, as well as history, I am concerned about malignancy, especially pancreatic or hepatocellular cancer.  Other types of biliary obstruction including gallbladder pathology are also possible.  We will get basic laboratory studies a CT  scan and urinalysis.  The patient will have Q 30-minute blood sugar checks to evaluate for hypoglycemia.  Plan admission.  ----------------------------------------- 11:34 PM on 04/25/2018 -----------------------------------------  The patient did have a downward trending blood sugar, so I have treated him with half an amp of D50.  His tachycardia has improved from the 120s to 103 at this time with intravenous fluids.  Patient continues to maintain his blood pressure.  His CT scan does show a pancreatic mass, concerning for malignancy.  The patient also has a new anemia, but previous blood counts are from several years ago.  The patient has no evidence of GI bleed on my examination and transfusion is not indicated at this time.  The patient is hyponatremic and hypokalemic, both of which are being supplemented.  The patient will be admitted to the hospitalist for further evaluation and treatment.  CRITICAL CARE Performed by: Eula Listen   Total critical care time: 40 minutes  Critical care time was exclusive of separately billable  procedures and treating other patients.  Critical care was necessary to treat or prevent imminent or life-threatening deterioration.  Critical care was time spent personally by me on the following activities: development of treatment plan with patient and/or surrogate as well as nursing, discussions with consultants, evaluation of patient's response to treatment, examination of patient, obtaining history from patient or surrogate, ordering and performing treatments and interventions, ordering and review of laboratory studies, ordering and review of radiographic studies, pulse oximetry and re-evaluation of patient's condition.   ____________________________________________  FINAL CLINICAL IMPRESSION(S) / ED DIAGNOSES  Final diagnoses:  Pancreatic mass  Hypoglycemia  Hyponatremia  Hypokalemia  Anemia, unspecified type         NEW MEDICATIONS  STARTED DURING THIS VISIT:  New Prescriptions   No medications on file      Eula Listen, MD 04/25/18 1749    Eula Listen, MD 05/07/18 [phone]

## 2018-04-26 ENCOUNTER — Inpatient Hospital Stay: Payer: Medicare Other

## 2018-04-26 ENCOUNTER — Ambulatory Visit
Admission: RE | Admit: 2018-04-26 | Discharge: 2018-04-26 | Disposition: A | Discharge status: Home / Self Care | Payer: Medicare Other | Source: Ambulatory Visit | Attending: Internal Medicine | Admitting: Internal Medicine

## 2018-04-26 ENCOUNTER — Encounter
Admission: EM | Disposition: A | Discharge status: Home / Self Care | Payer: Self-pay | Source: Home / Self Care | Attending: Specialist

## 2018-04-26 ENCOUNTER — Inpatient Hospital Stay: Payer: Medicare Other | Admitting: Anesthesiology

## 2018-04-26 ENCOUNTER — Encounter: Payer: Self-pay | Admitting: *Deleted

## 2018-04-26 DIAGNOSIS — E871 Hypo-osmolality and hyponatremia: Secondary | ICD-10-CM | POA: Diagnosis present

## 2018-04-26 DIAGNOSIS — K8689 Other specified diseases of pancreas: Secondary | ICD-10-CM | POA: Diagnosis present

## 2018-04-26 DIAGNOSIS — K838 Other specified diseases of biliary tract: Secondary | ICD-10-CM

## 2018-04-26 DIAGNOSIS — D649 Anemia, unspecified: Secondary | ICD-10-CM

## 2018-04-26 DIAGNOSIS — D490 Neoplasm of unspecified behavior of digestive system: Secondary | ICD-10-CM

## 2018-04-26 DIAGNOSIS — K831 Obstruction of bile duct: Secondary | ICD-10-CM | POA: Diagnosis present

## 2018-04-26 DIAGNOSIS — E785 Hyperlipidemia, unspecified: Secondary | ICD-10-CM | POA: Diagnosis present

## 2018-04-26 DIAGNOSIS — E876 Hypokalemia: Secondary | ICD-10-CM

## 2018-04-26 DIAGNOSIS — E11649 Type 2 diabetes mellitus with hypoglycemia without coma: Secondary | ICD-10-CM | POA: Diagnosis present

## 2018-04-26 DIAGNOSIS — R188 Other ascites: Secondary | ICD-10-CM

## 2018-04-26 DIAGNOSIS — E43 Unspecified severe protein-calorie malnutrition: Secondary | ICD-10-CM | POA: Diagnosis present

## 2018-04-26 DIAGNOSIS — F17210 Nicotine dependence, cigarettes, uncomplicated: Secondary | ICD-10-CM | POA: Diagnosis present

## 2018-04-26 DIAGNOSIS — E162 Hypoglycemia, unspecified: Secondary | ICD-10-CM | POA: Diagnosis not present

## 2018-04-26 DIAGNOSIS — Z79899 Other long term (current) drug therapy: Secondary | ICD-10-CM | POA: Diagnosis not present

## 2018-04-26 DIAGNOSIS — Z794 Long term (current) use of insulin: Secondary | ICD-10-CM | POA: Diagnosis not present

## 2018-04-26 DIAGNOSIS — J449 Chronic obstructive pulmonary disease, unspecified: Secondary | ICD-10-CM | POA: Diagnosis present

## 2018-04-26 DIAGNOSIS — I129 Hypertensive chronic kidney disease with stage 1 through stage 4 chronic kidney disease, or unspecified chronic kidney disease: Secondary | ICD-10-CM | POA: Diagnosis present

## 2018-04-26 DIAGNOSIS — R911 Solitary pulmonary nodule: Secondary | ICD-10-CM

## 2018-04-26 DIAGNOSIS — N181 Chronic kidney disease, stage 1: Secondary | ICD-10-CM | POA: Diagnosis present

## 2018-04-26 DIAGNOSIS — D72829 Elevated white blood cell count, unspecified: Secondary | ICD-10-CM | POA: Diagnosis present

## 2018-04-26 DIAGNOSIS — E1169 Type 2 diabetes mellitus with other specified complication: Secondary | ICD-10-CM | POA: Diagnosis present

## 2018-04-26 DIAGNOSIS — Z7982 Long term (current) use of aspirin: Secondary | ICD-10-CM | POA: Diagnosis not present

## 2018-04-26 DIAGNOSIS — Z6820 Body mass index (BMI) 20.0-20.9, adult: Secondary | ICD-10-CM | POA: Diagnosis not present

## 2018-04-26 DIAGNOSIS — E1122 Type 2 diabetes mellitus with diabetic chronic kidney disease: Secondary | ICD-10-CM | POA: Diagnosis present

## 2018-04-26 HISTORY — PX: ERCP: SHX5425

## 2018-04-26 LAB — BASIC METABOLIC PANEL
ANION GAP: 9 (ref 5–15)
BUN: 7 mg/dL — ABNORMAL LOW (ref 8–23)
CALCIUM: 7.8 mg/dL — AB (ref 8.9–10.3)
CO2: 25 mmol/L (ref 22–32)
Chloride: 94 mmol/L — ABNORMAL LOW (ref 98–111)
Creatinine, Ser: <0.3 mg/dL — ABNORMAL LOW (ref 0.61–1.24)
Glucose, Bld: 101 mg/dL — ABNORMAL HIGH (ref 70–99)
POTASSIUM: 3.6 mmol/L (ref 3.5–5.1)
Sodium: 128 mmol/L — ABNORMAL LOW (ref 135–145)

## 2018-04-26 LAB — URINALYSIS, COMPLETE (UACMP) WITH MICROSCOPIC
BACTERIA UA: NONE SEEN
Glucose, UA: NEGATIVE mg/dL
Hgb urine dipstick: NEGATIVE
KETONES UR: NEGATIVE mg/dL
LEUKOCYTES UA: NEGATIVE
NITRITE: NEGATIVE
PROTEIN: NEGATIVE mg/dL
SPECIFIC GRAVITY, URINE: 1.034 — AB (ref 1.005–1.030)
pH: 6 (ref 5.0–8.0)

## 2018-04-26 LAB — CBC
HEMATOCRIT: 25.9 % — AB (ref 40.0–52.0)
HEMOGLOBIN: 9.3 g/dL — AB (ref 13.0–18.0)
MCH: 37.8 pg — ABNORMAL HIGH (ref 26.0–34.0)
MCHC: 35.9 g/dL (ref 32.0–36.0)
MCV: 105.2 fL — ABNORMAL HIGH (ref 80.0–100.0)
Platelets: 380 10*3/uL (ref 150–440)
RBC: 2.46 MIL/uL — AB (ref 4.40–5.90)
RDW: 16.9 % — ABNORMAL HIGH (ref 11.5–14.5)
WBC: 21.1 10*3/uL — AB (ref 3.8–10.6)

## 2018-04-26 LAB — GLUCOSE, CAPILLARY
GLUCOSE-CAPILLARY: 67 mg/dL — AB (ref 70–99)
GLUCOSE-CAPILLARY: 71 mg/dL (ref 70–99)
Glucose-Capillary: 54 mg/dL — ABNORMAL LOW (ref 70–99)
Glucose-Capillary: 79 mg/dL (ref 70–99)
Glucose-Capillary: 99 mg/dL (ref 70–99)
Glucose-Capillary: 118 mg/dL — ABNORMAL HIGH (ref 70–99)
Glucose-Capillary: 139 mg/dL — ABNORMAL HIGH (ref 70–99)
Glucose-Capillary: 389 mg/dL — ABNORMAL HIGH (ref 70–99)

## 2018-04-26 SURGERY — ERCP, WITH INTERVENTION IF INDICATED
Anesthesia: General

## 2018-04-26 MED ORDER — KCL IN DEXTROSE-NACL 20-5-0.45 MEQ/L-%-% IV SOLN
INTRAVENOUS | Status: AC
  Administered 2018-04-26: 05:00:00 via INTRAVENOUS
  Filled 2018-04-26: qty 1000

## 2018-04-26 MED ORDER — DEXTROSE 50 % IV SOLN
25.0000 mL | Freq: Once | INTRAVENOUS | Status: AC
  Administered 2018-04-26: 25 mL via INTRAVENOUS

## 2018-04-26 MED ORDER — ASPIRIN EC 81 MG PO TBEC: 81.0000 mg | DELAYED_RELEASE_TABLET | ORAL | Status: DC

## 2018-04-26 MED ORDER — LIDOCAINE HCL (CARDIAC) PF 100 MG/5ML IV SOSY
PREFILLED_SYRINGE | INTRAVENOUS | Status: DC | PRN
  Administered 2018-04-26: 100 mg via INTRAVENOUS

## 2018-04-26 MED ORDER — FENTANYL CITRATE (PF) 100 MCG/2ML IJ SOLN: 25.0000 ug | INTRAMUSCULAR | Status: DC | PRN

## 2018-04-26 MED ORDER — FENTANYL CITRATE (PF) 100 MCG/2ML IJ SOLN
INTRAMUSCULAR | Status: DC | PRN
  Administered 2018-04-26 (×2): 50 ug via INTRAVENOUS

## 2018-04-26 MED ORDER — INDOMETHACIN 50 MG RE SUPP
100.0000 mg | Freq: Once | RECTAL | Status: DC
  Filled 2018-04-26: qty 2

## 2018-04-26 MED ORDER — ONDANSETRON HCL 4 MG/2ML IJ SOLN
INTRAMUSCULAR | Status: DC | PRN
  Administered 2018-04-26: 4 mg via INTRAVENOUS

## 2018-04-26 MED ORDER — VITAMIN B-12 1000 MCG PO TABS
1000.0000 ug | ORAL_TABLET | Freq: Every day | ORAL | Status: DC
  Administered 2018-04-26 – 2018-04-27 (×2): 1000 ug via ORAL
  Filled 2018-04-26 (×2): qty 1

## 2018-04-26 MED ORDER — INSULIN ASPART 100 UNIT/ML ~~LOC~~ SOLN
0.0000 [IU] | Freq: Every day | SUBCUTANEOUS | Status: DC
  Administered 2018-04-27: 4 [IU] via SUBCUTANEOUS
  Filled 2018-04-26: qty 1

## 2018-04-26 MED ORDER — ENOXAPARIN SODIUM 40 MG/0.4ML ~~LOC~~ SOLN
40.0000 mg | SUBCUTANEOUS | Status: DC
  Administered 2018-04-26: 40 mg via SUBCUTANEOUS
  Filled 2018-04-26: qty 0.4

## 2018-04-26 MED ORDER — MAGNESIUM CHLORIDE 64 MG PO TBEC
1.0000 | DELAYED_RELEASE_TABLET | Freq: Every day | ORAL | Status: DC
  Administered 2018-04-26 – 2018-04-27 (×2): 64 mg via ORAL
  Filled 2018-04-26 (×2): qty 1

## 2018-04-26 MED ORDER — ONDANSETRON HCL 4 MG/2ML IJ SOLN: 4.0000 mg | Freq: Once | INTRAMUSCULAR | Status: DC | PRN

## 2018-04-26 MED ORDER — ALBUMIN HUMAN 25 % IV SOLN
25.0000 g | Freq: Once | INTRAVENOUS | Status: AC
  Administered 2018-04-26: 25 g via INTRAVENOUS
  Filled 2018-04-26: qty 100

## 2018-04-26 MED ORDER — ROCURONIUM BROMIDE 100 MG/10ML IV SOLN
INTRAVENOUS | Status: DC | PRN
  Administered 2018-04-26: 10 mg via INTRAVENOUS
  Administered 2018-04-26: 30 mg via INTRAVENOUS

## 2018-04-26 MED ORDER — PROPOFOL 10 MG/ML IV BOLUS
INTRAVENOUS | Status: DC | PRN
  Administered 2018-04-26: 100 mg via INTRAVENOUS

## 2018-04-26 MED ORDER — DEXAMETHASONE SODIUM PHOSPHATE 10 MG/ML IJ SOLN
INTRAMUSCULAR | Status: DC | PRN
  Administered 2018-04-26: 10 mg via INTRAVENOUS

## 2018-04-26 MED ORDER — SUCCINYLCHOLINE CHLORIDE 20 MG/ML IJ SOLN
INTRAMUSCULAR | Status: DC | PRN
  Administered 2018-04-26: 100 mg via INTRAVENOUS

## 2018-04-26 MED ORDER — MOMETASONE FURO-FORMOTEROL FUM 200-5 MCG/ACT IN AERO
2.0000 | INHALATION_SPRAY | Freq: Two times a day (BID) | RESPIRATORY_TRACT | Status: DC
  Administered 2018-04-26 – 2018-04-27 (×3): 2 via RESPIRATORY_TRACT
  Filled 2018-04-26: qty 8.8

## 2018-04-26 MED ORDER — INSULIN ASPART 100 UNIT/ML ~~LOC~~ SOLN
0.0000 [IU] | Freq: Three times a day (TID) | SUBCUTANEOUS | Status: DC
  Administered 2018-04-27 (×2): 7 [IU] via SUBCUTANEOUS
  Filled 2018-04-26 (×2): qty 1

## 2018-04-26 MED ORDER — FENTANYL CITRATE (PF) 100 MCG/2ML IJ SOLN
INTRAMUSCULAR | Status: AC
  Filled 2018-04-26: qty 2

## 2018-04-26 MED ORDER — IOHEXOL 300 MG/ML  SOLN
75.0000 mL | Freq: Once | INTRAMUSCULAR | Status: AC | PRN
  Administered 2018-04-26: 75 mL via INTRAVENOUS

## 2018-04-26 MED ORDER — INDOMETHACIN 50 MG RE SUPP
RECTAL | Status: AC
  Filled 2018-04-26: qty 2

## 2018-04-26 MED ORDER — SUGAMMADEX SODIUM 200 MG/2ML IV SOLN
INTRAVENOUS | Status: DC | PRN
  Administered 2018-04-26: 126.6 mg via INTRAVENOUS

## 2018-04-26 MED ORDER — SODIUM CHLORIDE 0.9 % IV SOLN
INTRAVENOUS | Status: DC
  Administered 2018-04-26: 16:00:00 via INTRAVENOUS

## 2018-04-26 NOTE — H&P (Signed)
[place] [facility] [place] NAME: Bryan Hicks    MR#:  [id number]  DATE OF BIRTH:  1941/03/08  DATE OF ADMISSION:  04/25/2018  PRIMARY CARE PHYSICIAN: Kirk Ruths, MD   REQUESTING/REFERRING PHYSICIAN: Dr. Mariea Clonts  CHIEF COMPLAINT:   Chief Complaint  Patient presents with  . Hypoglycemia    HISTORY OF PRESENT ILLNESS:  Bryan Hicks  is a 77 y.o. male who presents with chief complaint as above.  Patient reported feeling weak and having "chills" at home after a recent visit to primary care physician for BLE swelling.  EMS was called, patient's blood sugar was 45 and patient was brought to ED.  At recent appointment, patient was noted to have severe jaundice and his primary care physician had ordered an abdominal CT for tomorrow.  Patient was given dextrose by EMS in route, with arrival CBG greater than 100.  Patient reports that "other people" noticed his color was "off" a little over a week ago.  Patient also reports BLE swelling, abdominal distention, coca-cola colored urine and weakness all beginning in the same time frame of a little over a week.  Patient denies pain or abdominal discomfort, including nausea and vomiting.  Patient does report diarrhea the last 2 days and a 20 lb.- 30 lb weight loss since April of this year.  Abdominal CT showed a 2.2 cm x 2.9 cm mass on the pancreatic head.  Hospitalist consulted for admission.  PAST MEDICAL HISTORY:   Past Medical History:  Diagnosis Date  . COPD (chronic obstructive pulmonary disease) ([place])   . Diabetes mellitus without complication ([place])   . Hypertension   . Shortness of breath dyspnea     PAST SURGICAL HISTORY:   Past Surgical History:  Procedure Laterality Date  . COLON SURGERY     "twisted intestine"  . HERNIA MESH REMOVAL Right   . INGUINAL HERNIA REPAIR Left 12/04/2014   Procedure: HERNIA REPAIR INGUINAL ADULT;  Surgeon: Rochel Brome, MD;  Location: [facility] ORS;   Service: General;  Laterality: Left;    SOCIAL HISTORY:   Social History   Tobacco Use  . Smoking status: Current Every Day Smoker    Packs/day: 1.00    Years: 30.00    Pack years: 30.00  . Smokeless tobacco: Current User  Substance Use Topics  . Alcohol use: No     FAMILY HISTORY:  Family history reviewed and is non-contributory.   DRUG ALLERGIES:  No Known Allergies  MEDICATIONS AT HOME:   Prior to Admission medications   Medication Sig Start Date End Date Taking? Authorizing Provider  aspirin EC 81 MG tablet Take 81 mg by mouth every other day.    Yes [provider]  Fluticasone-Salmeterol (ADVAIR) 250-50 MCG/DOSE AEPB Inhale 1 puff into the lungs 2 (two) times daily.    Yes [provider]  LANTUS SOLOSTAR 100 UNIT/ML Solostar Pen Inject 22 Units into the skin daily.  03/07/18  Yes [provider]  magnesium chloride (SLOW-MAG) 64 MG TBEC SR tablet Take 1 tablet by mouth daily.   Yes [provider]  metFORMIN (GLUCOPHAGE-XR) 500 MG 24 hr tablet Take 1,000 mg by mouth 2 (two) times daily.  01/22/18  Yes [provider]  vitamin B-12 (CYANOCOBALAMIN) 1000 MCG tablet Take 1,000 mcg by mouth daily.   Yes [provider]    REVIEW OF SYSTEMS:  Review of Systems  Constitutional: Positive for malaise/fatigue and weight loss. Negative for chills  and fever.  HENT: Negative for ear pain, hearing loss and tinnitus.   Eyes: Negative for blurred vision, double vision, pain and redness.  Respiratory: Positive for wheezing. Negative for cough, hemoptysis and shortness of breath.        Pt. States baseline with COPD- taking home nebs   Cardiovascular: Positive for leg swelling. Negative for chest pain, palpitations and orthopnea.  Gastrointestinal: Positive for diarrhea. Negative for abdominal pain, blood in stool, constipation, nausea and vomiting.  Genitourinary: Negative for dysuria, frequency and hematuria.  Musculoskeletal:  Negative for back pain, joint pain and neck pain.  Skin:       No acne, rash, or lesions  Neurological: Negative for dizziness, tremors, focal weakness and weakness.  Endo/Heme/Allergies: Negative for polydipsia. Does not bruise/bleed easily.  Psychiatric/Behavioral: Negative for depression. The patient is not nervous/anxious and does not have insomnia.      VITAL SIGNS:   Vitals:   04/25/18 2102 04/25/18 2105 04/25/18 2130 04/25/18 2323  BP: 124/62  (!) 108/53 (!) 102/59  Pulse: 100  (!) 121 77  Resp: 18  (!) 23 18  Temp: 99.6 F (37.6 C)   99.9 F (37.7 C)  TempSrc: Oral     SpO2: 100%  98% 96%  Weight:  62.1 kg    Height:  5\' 10"  (1.778 m)     Wt Readings from Last 3 Encounters:  04/25/18 62.1 kg  12/04/14 77.1 kg  11/26/14 77.1 kg    PHYSICAL EXAMINATION:  Physical Exam  Vitals reviewed. Constitutional: He is oriented to person, place, and time. He appears well-developed. No distress.  Patient appears malnourished.  HENT:  Head: Normocephalic and atraumatic.  Mouth/Throat: Oropharynx is clear and moist.  Eyes: Pupils are equal, round, and reactive to light. Conjunctivae and EOM are normal. No scleral icterus.  Neck: Normal range of motion. Neck supple. No JVD present. No thyromegaly present.  Cardiovascular: Normal rate, regular rhythm and intact distal pulses. Exam reveals no gallop and no friction rub.  No murmur heard. Respiratory: Effort normal. No respiratory distress. He has wheezes. He has no rales.  Pt. States baseline with COPD  GI: Soft. Bowel sounds are normal. He exhibits distension. There is no tenderness.  Musculoskeletal: Normal range of motion. He exhibits edema.  No arthritis, no gout  Lymphadenopathy:    He has no cervical adenopathy.  Neurological: He is alert and oriented to person, place, and time. No cranial nerve deficit.  No dysarthria, no aphasia  Skin: Skin is warm and dry. No rash noted. No erythema.  Generalized Jaundice present   Psychiatric: He has a normal mood and affect. His behavior is normal. Judgment and thought content normal.    LABORATORY PANEL:   CBC Recent Labs  Lab 04/25/18 2106  WBC 12.4*  HGB 9.1*  HCT 25.2*  PLT 362   ------------------------------------------------------------------------------------------------------------------  Chemistries  Recent Labs  Lab 04/25/18 2106  NA 127*  K 3.1*  CL 90*  CO2 26  GLUCOSE 87  BUN 10  CREATININE 0.42*  CALCIUM 8.2*  AST 178*  ALT 165*  ALKPHOS 1,507*  BILITOT 16.0*   ------------------------------------------------------------------------------------------------------------------  Cardiac Enzymes No results for input(s): TROPONINI in the last 168 hours. ------------------------------------------------------------------------------------------------------------------  RADIOLOGY:  Ct Abdomen Pelvis W Contrast  Result Date: 04/25/2018 CLINICAL DATA:  Acute abdominal pain, hypoglycemia EXAM: CT ABDOMEN AND PELVIS WITH CONTRAST TECHNIQUE: Multidetector CT imaging of the abdomen and pelvis was performed using the standard protocol following bolus administration of intravenous contrast. CONTRAST:  159mL ISOVUE-300 IOPAMIDOL (ISOVUE-300) INJECTION 61% COMPARISON:  10/21/2012 FINDINGS: Lower chest: Mild right lower lobe atelectasis. Hepatobiliary: Liver is within normal limits. Moderate intrahepatic and extrahepatic ductal dilatation. Common duct measures up to 2.4 cm (coronal image 40) with extrinsic compression by a dominant cystic/necrotic lesion in the porta hepatitis. Gallbladder is unremarkable. No intrahepatic or extrahepatic ductal dilatation. Pancreas: 2.2 x 2.9 cm hypoenhancing lesion in the pancreatic head (series 2/image 36), highly suspicious for pancreatic neoplasm. Severe dilatation of the main pancreatic duct in the body/tail (series 2/image 27). Associated pancreatic atrophy. Peripancreatic fluid collections with enhancement along  the anterior aspect of the pancreatic body/tail (series 2/image 27). Additional 4.1 x 3.8 cm cystic/necrotic lesion in the region of the pancreatic head, with mass effect on the common duct. Spleen: Within normal limits. Adrenals/Urinary Tract: Bilateral adrenal nodules, measuring up to 2.7 cm on the left,similar to 2014 and likely reflecting benign adrenal adenomas. Kidneys are within normal limits.  No hydronephrosis. Bladder is grossly unremarkable. Stomach/Bowel: Stomach is notable for a tiny hiatal hernia. Visualized bowel is grossly unremarkable. Appendix is not discretely visualized. Vascular/Lymphatic: No evidence of abdominal aortic aneurysm. No vascular involvement. Atherosclerotic calcifications of the abdominal aorta and branch vessels. No suspicious abdominopelvic lymphadenopathy. Reproductive: Prostate is grossly unremarkable. Other: Moderate abdominopelvic ascites. Postsurgical changes related to prior right ventral hernia mesh repair. Musculoskeletal: Grade 1 anterolisthesis at L5-S1. IMPRESSION: 2.2 x 2.9 cm hypoenhancing lesion in the pancreatic head, highly suspicious for pancreatic neoplasm. Associated double duct sign with intrahepatic/extrahepatic ductal dilatation and dilatation of the pancreatic duct. Associated atrophy of the pancreatic body/tail. Associated peripancreatic fluid collections versus necrotic lesions, including a dominant 4.1 x 3.8 cm cystic lesion superior to the pancreatic head, with mass effect on the distal common duct. Moderate abdominopelvic ascites. Additional ancillary findings as above. Electronically Signed   By: Julian Hy M.D.   On: 04/25/2018 23:01    EKG:   Orders placed or performed during the hospital encounter of 04/25/18  . EKG 12-Lead  . EKG 12-Lead    IMPRESSION AND PLAN:  Principal Problem:   Pancreatic mass- Abdominal CT reviewed, results discussed with patient.  Oncology and GI consult ordered.  Patient admitted to [facility] for  monitoring.  Active Problems: COPD- Home dose inhalers continued.  Diabetes Mellitus- Due to hypoglycemia inpatient, D5 0.45 NS IVF ordered with blood glucose monitoring every 4 hours.  Hypertension- Primary Care Physician had discontinued patient's previous prescription for Lisinopril today, BP stable at this time will continue to monitor.  Hyperlipidemia- Primary Care Physician had discontinued patient's previous prescription for Lovastatin today.  Chart review performed and case discussed with ED provider. Labs, imaging and/or ECG reviewed by provider and discussed with patient/family. Management plans discussed with the patient and/or family.  DVT PROPHYLAXIS: SubQ lovenox   GI PROPHYLAXIS:  None  ADMISSION STATUS: Inpatient     CODE STATUS:  Advance Directive Documentation     Most Recent Value  Type of Advance Directive  Healthcare Power of Attorney, Living will  Pre-existing out of facility DNR order (yellow form or pink MOST form)  -  "MOST" Form in Place?  -      TOTAL TIME TAKING CARE OF THIS PATIENT: 40 minutes.   Venesa Semidey Eaton 04/26/2018, 12:50 AM  [employer]  [phone]  CC: Primary care physician; Kirk Ruths, MD  Note:  This document was prepared using Dragon voice recognition software and may include unintentional dictation errors.

## 2018-04-26 NOTE — Anesthesia Procedure Notes (Signed)
Procedure Name: Intubation Date/Time: 04/26/2018 3:53 PM Performed by: Nelda Marseille, CRNA Pre-anesthesia Checklist: Patient identified, Patient being monitored, Timeout performed, Emergency Drugs available and Suction available Patient Re-evaluated:Patient Re-evaluated prior to induction Oxygen Delivery Method: Circle system utilized Preoxygenation: Pre-oxygenation with 100% oxygen Induction Type: IV induction Ventilation: Mask ventilation without difficulty Laryngoscope Size: Mac, 3 and McGraph Grade View: Grade I Tube type: Oral Tube size: 7.0 mm Number of attempts: 1 Airway Equipment and Method: Stylet and Video-laryngoscopy Placement Confirmation: ETT inserted through vocal cords under direct vision,  positive ETCO2 and breath sounds checked- equal and bilateral Secured at: 21 cm Tube secured with: Tape Dental Injury: Teeth and Oropharynx as per pre-operative assessment  Difficulty Due To: Difficulty was anticipated

## 2018-04-26 NOTE — Op Note (Addendum)
[facility] Patient Name: Bryan Hicks Procedure Date: 04/26/2018 3:13 PM MRN: [id number] Account #: [id number] Date of Birth: 04/23/1941 Admit Type: Inpatient Age: 77 Room: [facility] ENDO ROOM 4 Gender: Male Note Status: Finalized Procedure:            ERCP Indications:          Tumor of the head of pancreas Providers:            Lucilla Lame MD, MD Referring MD:         Ocie Cornfield. Ouida Sills MD, MD (Referring MD) Medicines:            General Anesthesia Complications:        No immediate complications. Procedure:            Pre-Anesthesia Assessment:                       - Prior to the procedure, a History and Physical was                        performed, and patient medications and allergies were                        reviewed. The patient's tolerance of previous                        anesthesia was also reviewed. The risks and benefits of                        the procedure and the sedation options and risks were                        discussed with the patient. All questions were                        answered, and informed consent was obtained. Prior                        Anticoagulants: The patient has taken no previous                        anticoagulant or antiplatelet agents. ASA Grade                        Assessment: III - A patient with severe systemic                        disease. After reviewing the risks and benefits, the                        patient was deemed in satisfactory condition to undergo                        the procedure.                       After obtaining informed consent, the scope was passed                        under direct vision. Throughout the procedure, the  patient's blood pressure, pulse, and oxygen saturations                        were monitored continuously. The Duodenoscope was                        introduced through the mouth, and used to inject     contrast into and used to inject contrast into the bile                        duct. The ERCP was accomplished without difficulty. The                        patient tolerated the procedure well. The ERCP was                        accomplished without difficulty. The patient tolerated                        the procedure well. Findings:      The scout film was normal. A large fungating mass was found at the major       papilla. The major papilla was ulcerated. The bile duct was deeply       cannulated with the short-nosed traction sphincterotome. Contrast was       injected. I personally interpreted the bile duct images. There was brisk       flow of contrast through the ducts. Image quality was excellent.       Contrast extended to the entire biliary tree. The entire biliary tree       was diffusely dilated, acquired. The lower third of the main bile duct       contained a single localized stenosis 5 mm in length. A wire was passed       into the biliary tree. One 10 Fr by 7 cm plastic stent with a single       external flap and a single internal flap was placed 5 cm into the common       bile duct. Bile flowed through the stent. The stent was in good       position. Ampulla were biopsied with a cold forceps for histology. Impression:           - The major papilla appeared to have a mass.                       - The major papilla appeared to be ulcerated.                       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                       - The entire biliary tree was dilated, acquired.                       -  One plastic stent was placed into the common bile                        duct.                       - Biopsy was performed Ampulla. Recommendation:       - Return patient to hospital ward for ongoing care.                       - Clear liquid  diet today.                       - Await path results.                       - Watch for pancreatitis, bleeding, perforation, and                        cholangitis.                       - Repeat ERCP in 3 months to exchange stent. Procedure Code(s):    --- Professional ---                       [phone], Endoscopic retrograde cholangiopancreatography                        (ERCP); with placement of endoscopic stent into biliary                        or pancreatic duct, including pre- and post-dilation                        and guide wire passage, when performed, including                        sphincterotomy, when performed, each stent                       [id number], Endoscopic retrograde cholangiopancreatography                        (ERCP); with biopsy, single or multiple                       [id number], Endoscopic catheterization of the biliary ductal                        system, radiological supervision and interpretation Diagnosis Code(s):    --- Professional ---                       D49.0, Neoplasm of unspecified behavior of digestive                        system                       K83.1, Obstruction of bile duct                       K83.8, Other specified diseases of biliary tract CPT copyright 2017 American  Medical Association. All rights reserved. The codes documented in this report are preliminary and upon coder review may  be revised to meet current compliance requirements. Lucilla Lame MD, MD 04/26/2018 4:16:52 PM This report has been signed electronically. Number of Addenda: 0 Note Initiated On: 04/26/2018 3:13 PM      [facility]

## 2018-04-26 NOTE — Progress Notes (Signed)
Initial Nutrition Assessment  DOCUMENTATION CODES:   Severe malnutrition in context of chronic illness  INTERVENTION:   - Once diet advanced, Ensure Enlive po BID, each supplement provides 350 kcal and 20 grams of protein (vanilla flavor)  - Recommend MVI with minerals daily  NUTRITION DIAGNOSIS:   Severe Malnutrition related to chronic illness (COPD, pancreatic mass with concerns for malignancy) as evidenced by moderate muscle depletion, severe muscle depletion, moderate fat depletion, severe fat depletion.  GOAL:   Patient will meet greater than or equal to 90% of their needs  MONITOR:   Diet advancement, Supplement acceptance, Labs, Weight trends, I & O's  REASON FOR ASSESSMENT:   Malnutrition Screening Tool    ASSESSMENT:   77 year old male who presented to the ED on 10/3 with hypoglycemia. PMH significant for hypertension, diabetes mellitus, COPD, and tobacco use. CT scan showed pancreatic mass with concerns for malignancy.  Spoke with pt at bedside. Pt denies having a poor appetite and states that he has been losing weight despite having a good appetite and trying to eat more and drink supplements to keep his weight up.  Pt shares that his UBW is 150-155 lbs and that he last weighed this in March 2019. Pt states that he lost weight down to 126 lbs but has been gaining weight back recently due to increased efforts to eat more. Given current weight of 139 lbs, pt has experienced an 11 lb weight loss in 7 months. This is 7.3% weight loss which is not significant for timeframe.  Pt reports that he typically eats 2 meals daily. Pt has been drinking 2 Glucerna oral nutrition supplements daily over the past several months. Pt also reports drinking water and diet sodas.  Breakfast: 2 eggs, sausage Dinner: burgers or [employer] sandwich with fries  Pt is willing to try Ensure Enlive oral nutrition supplements during admission given the higher kcal and protein content of Ensure  Enlive vs Glucerna. Pt with severe malnutrition and would benefit from nutrient-dense supplement. One Ensure Enlive supplement provides 350 kcals, 20 grams protein, and 44-45 grams of carbohydrate vs one Glucerna shake supplement, which provides 220 kcals, 10 grams of protein, and 26 grams of carbohydrate. Given pt's hx of DM, RD will continue to monitor diet advancement, PO intake, and CBG's and adjust supplement regimen as appropriate.   Medications reviewed and include: magnesium chloride daily, 1000 mcg vitamin B-12 daily, albumin 25 grams once, D5 and NaCl with KCl 20 mEq/L @ 75 ml/hr  Labs reviewed: sodium 128 (L), chloride 94 (L), BUN 7 (L), creatinine <0.30 (L), hemoglobin 9.3 (L), HCT 25.9 (L), WBC 21.1 (H) CBG's: 99, 79, 54, 71, 67, 111, 77, 97 x 12 hours  NUTRITION - FOCUSED PHYSICAL EXAM:    Most Recent Value  Orbital Region  Severe depletion  Upper Arm Region  Severe depletion  Thoracic and Lumbar Region  Moderate depletion  Buccal Region  Moderate depletion  Temple Region  Moderate depletion  Clavicle Bone Region  Severe depletion  Clavicle and Acromion Bone Region  Severe depletion  Scapular Bone Region  Moderate depletion  Dorsal Hand  Severe depletion  Patellar Region  Moderate depletion  Anterior Thigh Region  Moderate depletion  Posterior Calf Region  Moderate depletion  Edema (RD Assessment)  Moderate [BLE, abdomen]  Hair  Reviewed  Eyes  Reviewed  Mouth  Reviewed  Skin  Reviewed [jaundice]  Nails  Reviewed       Diet Order:   Diet Order  Diet NPO time specified  Diet effective now              EDUCATION NEEDS:   No education needs have been identified at this time  Skin:  Skin Assessment: Reviewed RN Assessment  Last BM:  10/3  Height:   Ht Readings from Last 1 Encounters:  04/26/18 5\' 10"  (1.778 m)    Weight:   Wt Readings from Last 1 Encounters:  04/26/18 63.3 kg    Ideal Body Weight:  75.45 kg  BMI:  Body mass index is  20.02 kg/m.  Estimated Nutritional Needs:   Kcal:  1800-2000  Protein:  90-105 grams  Fluid:  1.8-2.0 L    Gaynell Face, MS, RD, LDN Inpatient Clinical Dietitian Pager: [phone] Weekend/After Hours: [phone]

## 2018-04-26 NOTE — ED Notes (Signed)
Transporting to room 240 via stretcher.

## 2018-04-26 NOTE — Progress Notes (Addendum)
[facility] [place] NAME: Bryan Hicks    MR#:  [id number]  DATE OF BIRTH:  July 01, 1941  SUBJECTIVE:  CHIEF COMPLAINT:   Chief Complaint  Patient presents with  . Hypoglycemia   -Weight loss, malaise over the last couple of months.  Also jaundiced as outpatient. -Coming in with weakness and noted to have hypoglycemia.  Also white count is significantly elevated  REVIEW OF SYSTEMS:  Review of Systems  Constitutional: Positive for malaise/fatigue and weight loss. Negative for chills and fever.  HENT: Negative for congestion, ear discharge, hearing loss and nosebleeds.   Eyes: Negative for blurred vision and double vision.  Respiratory: Negative for cough, shortness of breath and wheezing.   Cardiovascular: Negative for chest pain, palpitations and leg swelling.  Gastrointestinal: Positive for abdominal pain. Negative for constipation, diarrhea, nausea and vomiting.  Genitourinary: Negative for dysuria.  Musculoskeletal: Negative for myalgias.  Neurological: Negative for dizziness, focal weakness, seizures, weakness and headaches.  Psychiatric/Behavioral: Negative for depression.    DRUG ALLERGIES:  No Known Allergies  VITALS:  Blood pressure 122/65, pulse 83, temperature 99.1 F (37.3 C), temperature source Oral, resp. rate 14, height 5' 10"  (1.778 m), weight 63.3 kg, SpO2 97 %.  PHYSICAL EXAMINATION:  Physical Exam  GENERAL:  77 y.o.-year-old ill nourished patient lying in the bed with no acute distress.  EYES: Pupils equal, round, reactive to light and accommodation.  Positive for scleral icterus. Extraocular muscles intact.  And edema on the right side with some periorbital right eye swelling HEENT: Head atraumatic, normocephalic. Oropharynx and nasopharynx clear.  NECK:  Supple, no jugular venous distention. No thyroid enlargement, no tenderness.  LUNGS: Normal breath sounds bilaterally, no wheezing, rales,rhonchi or  crepitation. No use of accessory muscles of respiration.  Decreased bibasilar breath sounds CARDIOVASCULAR: S1, S2 normal. No murmurs, rubs, or gallops.  ABDOMEN: Soft, normal discomfort on palpation, nontender nondistended. Bowel sounds present. No organomegaly or mass.  EXTREMITIES: No pedal edema, cyanosis, or clubbing.  NEUROLOGIC: Cranial nerves II through XII are intact. Muscle strength 5/5 in all extremities. Sensation intact. Gait not checked.  PSYCHIATRIC: The patient is alert and oriented x 3.  SKIN: No obvious rash, lesion, or ulcer.  Jaundiced skin   LABORATORY PANEL:   CBC Recent Labs  Lab 04/26/18 0606  WBC 21.1*  HGB 9.3*  HCT 25.9*  PLT 380   ------------------------------------------------------------------------------------------------------------------  Chemistries  Recent Labs  Lab 04/25/18 2106 04/26/18 0606  NA 127* 128*  K 3.1* 3.6  CL 90* 94*  CO2 26 25  GLUCOSE 87 101*  BUN 10 7*  CREATININE 0.42* <0.30*  CALCIUM 8.2* 7.8*  AST 178*  --   ALT 165*  --   ALKPHOS 1,507*  --   BILITOT 16.0*  --    ------------------------------------------------------------------------------------------------------------------  Cardiac Enzymes No results for input(s): TROPONINI in the last 168 hours. ------------------------------------------------------------------------------------------------------------------  RADIOLOGY:  Ct Abdomen Pelvis W Contrast  Result Date: 04/25/2018 CLINICAL DATA:  Acute abdominal pain, hypoglycemia EXAM: CT ABDOMEN AND PELVIS WITH CONTRAST TECHNIQUE: Multidetector CT imaging of the abdomen and pelvis was performed using the standard protocol following bolus administration of intravenous contrast. CONTRAST:  161m ISOVUE-300 IOPAMIDOL (ISOVUE-300) INJECTION 61% COMPARISON:  10/21/2012 FINDINGS: Lower chest: Mild right lower lobe atelectasis. Hepatobiliary: Liver is within normal limits. Moderate intrahepatic and extrahepatic ductal  dilatation. Common duct measures up to 2.4 cm (coronal image 40) with extrinsic compression by a dominant cystic/necrotic lesion in the porta  hepatitis. Gallbladder is unremarkable. No intrahepatic or extrahepatic ductal dilatation. Pancreas: 2.2 x 2.9 cm hypoenhancing lesion in the pancreatic head (series 2/image 36), highly suspicious for pancreatic neoplasm. Severe dilatation of the main pancreatic duct in the body/tail (series 2/image 27). Associated pancreatic atrophy. Peripancreatic fluid collections with enhancement along the anterior aspect of the pancreatic body/tail (series 2/image 27). Additional 4.1 x 3.8 cm cystic/necrotic lesion in the region of the pancreatic head, with mass effect on the common duct. Spleen: Within normal limits. Adrenals/Urinary Tract: Bilateral adrenal nodules, measuring up to 2.7 cm on the left,similar to 2014 and likely reflecting benign adrenal adenomas. Kidneys are within normal limits.  No hydronephrosis. Bladder is grossly unremarkable. Stomach/Bowel: Stomach is notable for a tiny hiatal hernia. Visualized bowel is grossly unremarkable. Appendix is not discretely visualized. Vascular/Lymphatic: No evidence of abdominal aortic aneurysm. No vascular involvement. Atherosclerotic calcifications of the abdominal aorta and branch vessels. No suspicious abdominopelvic lymphadenopathy. Reproductive: Prostate is grossly unremarkable. Other: Moderate abdominopelvic ascites. Postsurgical changes related to prior right ventral hernia mesh repair. Musculoskeletal: Grade 1 anterolisthesis at L5-S1. IMPRESSION: 2.2 x 2.9 cm hypoenhancing lesion in the pancreatic head, highly suspicious for pancreatic neoplasm. Associated double duct sign with intrahepatic/extrahepatic ductal dilatation and dilatation of the pancreatic duct. Associated atrophy of the pancreatic body/tail. Associated peripancreatic fluid collections versus necrotic lesions, including a dominant 4.1 x 3.8 cm cystic lesion  superior to the pancreatic head, with mass effect on the distal common duct. Moderate abdominopelvic ascites. Additional ancillary findings as above. Electronically Signed   By: Julian Hy M.D.   On: 04/25/2018 23:01    EKG:   Orders placed or performed during the hospital encounter of 04/25/18  . EKG 12-Lead  . EKG 12-Lead    ASSESSMENT AND PLAN:   77 year old male with past medical history significant for COPD, hypertension, diabetes was brought in secondary to weakness and noted to have hyperglycemia and a pancreatic mass.  1.  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  Also there is extrinsic compression with intrahepatic ductal dilatation. -Also has ascites.  GI consulted for possible ERCP, biopsy and stent placement -Significantly elevated bilirubin at 16, also elevated alk phos and LFTs.  Albumin is 2.4 -Keep him n.p.o. -CT chest ordered for staging. -Patient also has moderate ascites-ultrasound-guided tap requested for cytology as well -Oncology consult requested.  CA-19-9 is pending  2.  Lower extremity swelling ultrasound Dopplers ordered to rule out DVT -Also be from his hypoalbuminemia  3.  COPD-stable.  Continue home inhalers  4.  Hypoglycemia-secondary to poor oral intake.  D5 fluids for now  5.  DVT prophylaxis-Lovenox  6.  Hyponatremia-poor oral intake.  IV fluids  7.  Leukocytosis-could be reactive.  However blood cultures ordered.  UA is negative.   - not on antibiotics - f/u differential in AM  Patient is independent and ambulatory Discussed with GI and oncology   All the records are reviewed and case discussed with Care Management/Social Workerr. Management plans discussed with the patient, family and they are in agreement.  CODE STATUS: Full code  TOTAL TIME TAKING CARE OF THIS PATIENT: 38 minutes.   POSSIBLE D/C IN 1-2 DAYS, DEPENDING ON CLINICAL CONDITION.   Gladstone Lighter M.D on  04/26/2018 at 9:22 AM  Between 7am to 6pm - Pager - [phone]  After 6pm go to [email or web address] - password [email or web address] [facility]  Office  [phone]  CC: Primary care physician; Kirk Ruths, MD

## 2018-04-26 NOTE — Anesthesia Post-op Follow-up Note (Signed)
Anesthesia QCDR form completed.        

## 2018-04-26 NOTE — Consult Note (Addendum)
Bryan Antigua, MD [address] [place], [place], [place], [address] [address], [place], [place], [place], [address] Phone: [phone]  Fax: [phone]  Consultation  Referring Provider:     Dr. Tressia Miners Primary Care Physician:  Kirk Ruths, MD Reason for Consultation:     Jaundice  Date of Admission:  04/25/2018 Date of Consultation:  04/26/2018         HPI:   Bryan Hicks is a 77 y.o. male presented to the hospital due to lower extremity swelling, and found to have jaundice with increased bilirubin to 16.  Patient denies any abdominal pain, nausea or vomiting.  No appetite loss.  Reports change in the color of the stool and urine.  No hematochezia or melena.  No prior EGDs.  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  No fever or chills.  Past Medical History:  Diagnosis Date  . COPD (chronic obstructive pulmonary disease) ([facility])   . Diabetes mellitus without complication ([facility])   . Hypertension   . Shortness of breath dyspnea     Past Surgical History:  Procedure Laterality Date  . COLON SURGERY     "twisted intestine"  . HERNIA MESH REMOVAL Right   . INGUINAL HERNIA REPAIR Left 12/04/2014   Procedure: HERNIA REPAIR INGUINAL ADULT;  Surgeon: Rochel Brome, MD;  Location: [facility] ORS;  Service: General;  Laterality: Left;    Prior to Admission medications   Medication Sig Start Date End Date Taking? Authorizing Provider  aspirin EC 81 MG tablet Take 81 mg by mouth every other day.    Yes [provider]  Fluticasone-Salmeterol (ADVAIR) 250-50 MCG/DOSE AEPB Inhale 1 puff into the lungs 2 (two) times daily.    Yes [provider]  LANTUS SOLOSTAR 100 UNIT/ML Solostar Pen Inject 22 Units into the skin daily.  03/07/18  Yes [provider]  magnesium chloride (SLOW-MAG) 64 MG TBEC SR tablet Take 1 tablet by mouth  daily.   Yes [provider]  metFORMIN (GLUCOPHAGE-XR) 500 MG 24 hr tablet Take 1,000 mg by mouth 2 (two) times daily.  01/22/18  Yes [provider]  vitamin B-12 (CYANOCOBALAMIN) 1000 MCG tablet Take 1,000 mcg by mouth daily.   Yes [provider]    History reviewed. No pertinent family history.   Social History   Tobacco Use  . Smoking status: Current Every Day Smoker    Packs/day: 1.00    Years: 30.00    Pack years: 30.00  . Smokeless tobacco: Current User  Substance Use Topics  . Alcohol use: No  . Drug use: No    Allergies as of 04/25/2018  . (No Known Allergies)    Review of Systems:    All systems reviewed and negative except where noted in HPI.   Physical Exam:  Vital signs in last 24 hours: Vitals:   04/25/18 2323 04/26/18 0045 04/26/18 0323 04/26/18 0828  BP: (!) 102/59  114/61 122/65  Pulse: 77 87 80 83  Resp: 18 (!) 22 18 14   Temp: 99.9 F (37.7 C)  99.1 F (37.3 C) 99.1 F (37.3 C)  TempSrc:   Oral Oral  SpO2: 96% 94% 98% 97%  Weight:   63.3 kg   Height:   5\' 10"  (1.778 m)    Last BM Date: 04/25/18 General:   Pleasant, cooperative in NAD Head:  Normocephalic and atraumatic. Eyes:  No icterus.   Conjunctiva pink. PERRLA. Ears:  Normal auditory acuity. Neck:  Supple; no masses or thyroidomegaly Lungs: Respirations even and unlabored. Lungs clear to auscultation bilaterally.   No wheezes, crackles, or rhonchi.  Abdomen:  Soft, nondistended, nontender. Normal bowel sounds. No appreciable masses or hepatomegaly.  No rebound or guarding.  Neurologic:  Alert and oriented x3;  grossly normal neurologically. Skin:  Intact without significant lesions or rashes. Cervical Nodes:  No significant cervical adenopathy. Psych:  Alert and cooperative. Normal affect.  LAB RESULTS: Recent Labs    04/25/18 2106 04/26/18 0606  WBC 12.4* 21.1*  HGB 9.1* 9.3*  HCT 25.2* 25.9*  PLT 362 380   BMET Recent Labs    04/25/18 2106  04/26/18 0606  NA 127* 128*  K 3.1* 3.6  CL 90* 94*  CO2 26 25  GLUCOSE 87 101*  BUN 10 7*  CREATININE 0.42* <0.30*  CALCIUM 8.2* 7.8*   LFT Recent Labs    04/25/18 2106  PROT 6.0*  ALBUMIN 2.4*  AST 178*  ALT 165*  ALKPHOS 1,507*  BILITOT 16.0*   PT/INR Recent Labs    04/25/18 2333  LABPROT 18.7*  INR 1.58    STUDIES: Ct Chest W Contrast  Result Date: 04/26/2018 CLINICAL DATA:  Inpatient. Pancreatic head mass with biliary and pancreatic duct dilation, jaundice, abdominal pain. Chest staging. EXAM: CT CHEST WITH CONTRAST TECHNIQUE: Multidetector CT imaging of the chest was performed during intravenous contrast administration. CONTRAST:  60mL OMNIPAQUE IOHEXOL 300 MG/ML  SOLN COMPARISON:  04/25/2018 CT abdomen/pelvis. 10/26/2012 chest radiograph. FINDINGS: Cardiovascular: Normal heart size. No significant pericardial effusion/thickening. Three-vessel coronary atherosclerosis. Atherosclerotic nonaneurysmal thoracic aorta. Dilated main pulmonary artery (3.5 cm diameter). No central pulmonary emboli. Mediastinum/Nodes: No discrete thyroid nodules. Unremarkable esophagus. No axillary adenopathy. Mildly enlarged 1.3 cm right hilar node (series 2/image 85). No left hilar adenopathy. No pathologically enlarged mediastinal nodes. Lungs/Pleura: No pneumothorax. Small dependent right pleural effusion. No left pleural effusion. Moderate to severe centrilobular emphysema with diffuse bronchial wall thickening. Solid right upper lobe 1.2 cm pulmonary nodule (series 3/image 73). No lung masses or additional significant pulmonary nodules. Mild-to-moderate elevation of the right hemidiaphragm. Patchy somewhat bandlike consolidation and ground-glass opacity at the right lung base with associated volume loss. Upper abdomen: Small hiatal hernia. Re-demonstration prominent diffuse intrahepatic biliary ductal dilatation and pancreatic duct dilation, unchanged. Small to moderate volume upper abdominal  ascites. Partial visualization of necrotic adenopathy in the gastrohepatic ligament, unchanged. Musculoskeletal: No aggressive appearing focal osseous lesions. Mild thoracic spondylosis. IMPRESSION: 1. Solitary 1.2 cm solid right upper lobe pulmonary nodule, malignancy not excluded, equivocal for solitary metastasis versus primary bronchogenic carcinoma. 2. Mild right hilar adenopathy, cannot exclude nodal metastatic disease. 3. Small dependent right pleural effusion. 4. Mild-to-moderate elevation of the right hemidiaphragm with patchy somewhat bandlike consolidation and ground-glass opacity at the right lung base with associated volume loss, favor atelectasis/scarring, cannot exclude a component of pneumonia. 5. Redemonstration of ascites, necrotic gastrohepatic ligament adenopathy and biliary and pancreatic dilatation in the upper abdomen, unchanged. 6. Dilated main pulmonary artery, suggesting pulmonary arterial hypertension. 7. Three-vessel coronary atherosclerosis. 8. Small hiatal hernia. Aortic Atherosclerosis (ICD10-I70.0) and Emphysema (ICD10-J43.9). Electronically Signed   By: Ilona Sorrel M.D.   On: 04/26/2018 11:19   Ct Abdomen Pelvis W Contrast  Result Date: 04/25/2018 CLINICAL DATA:  Acute abdominal pain, hypoglycemia EXAM: CT ABDOMEN AND PELVIS WITH CONTRAST TECHNIQUE: Multidetector CT imaging of the abdomen and pelvis was performed using the standard protocol following  bolus administration of intravenous contrast. CONTRAST:  163mL ISOVUE-300 IOPAMIDOL (ISOVUE-300) INJECTION 61% COMPARISON:  10/21/2012 FINDINGS: Lower chest: Mild right lower lobe atelectasis. Hepatobiliary: Liver is within normal limits. Moderate intrahepatic and extrahepatic ductal dilatation. Common duct measures up to 2.4 cm (coronal image 40) with extrinsic compression by a dominant cystic/necrotic lesion in the porta hepatitis. Gallbladder is unremarkable. No intrahepatic or extrahepatic ductal dilatation. Pancreas: 2.2 x 2.9  cm hypoenhancing lesion in the pancreatic head (series 2/image 36), highly suspicious for pancreatic neoplasm. Severe dilatation of the main pancreatic duct in the body/tail (series 2/image 27). Associated pancreatic atrophy. Peripancreatic fluid collections with enhancement along the anterior aspect of the pancreatic body/tail (series 2/image 27). Additional 4.1 x 3.8 cm cystic/necrotic lesion in the region of the pancreatic head, with mass effect on the common duct. Spleen: Within normal limits. Adrenals/Urinary Tract: Bilateral adrenal nodules, measuring up to 2.7 cm on the left,similar to 2014 and likely reflecting benign adrenal adenomas. Kidneys are within normal limits.  No hydronephrosis. Bladder is grossly unremarkable. Stomach/Bowel: Stomach is notable for a tiny hiatal hernia. Visualized bowel is grossly unremarkable. Appendix is not discretely visualized. Vascular/Lymphatic: No evidence of abdominal aortic aneurysm. No vascular involvement. Atherosclerotic calcifications of the abdominal aorta and branch vessels. No suspicious abdominopelvic lymphadenopathy. Reproductive: Prostate is grossly unremarkable. Other: Moderate abdominopelvic ascites. Postsurgical changes related to prior right ventral hernia mesh repair. Musculoskeletal: Grade 1 anterolisthesis at L5-S1. IMPRESSION: 2.2 x 2.9 cm hypoenhancing lesion in the pancreatic head, highly suspicious for pancreatic neoplasm. Associated double duct sign with intrahepatic/extrahepatic ductal dilatation and dilatation of the pancreatic duct. Associated atrophy of the pancreatic body/tail. Associated peripancreatic fluid collections versus necrotic lesions, including a dominant 4.1 x 3.8 cm cystic lesion superior to the pancreatic head, with mass effect on the distal common duct. Moderate abdominopelvic ascites. Additional ancillary findings as above. Electronically Signed   By: Julian Hy M.D.   On: 04/25/2018 23:01      Impression / Plan:    AQUARIUS LATOUCHE is a 77 y.o. y/o male with jaundice, increased liver enzymes, imaging showing pancreatic head mass   Case was discussed with Dr. Allen Norris Plan is for ERCP today for stent placement to relieve biliary obstruction Dr. Janese Banks following If obvious pancreatic mass is seen endoscopically during the ERCP, biopsies may help with tissue diagnosis.  However, if an external mass is not seen endoscopically, would recommend discussing with radiology if patient's ascites can be obtained for cytology  Alternatively, EUS can need to be arranged as an outpatient if tissue diagnosis cannot be obtained with the above.  Or patient can be transferred as an inpatient to [facility] or [facility] for tissue diagnosis.  Continue daily CMP Avoid hepatotoxic drugs No signs of cholangitis at this time  Dr. Vicente Males is on call over the week and will be following the patient.  Please page him with any questions or concerns.  Patient has been signed out to him.  Thank you for involving me in the care of this patient.      LOS: 0 days   Virgel Manifold, MD  04/26/2018, 2:25 PM

## 2018-04-26 NOTE — Consult Note (Signed)
Hematology/Oncology Consult note [facility] Telephone:([phone] [phone]  Patient Care Team: Kirk Ruths, MD as PCP - General (Internal Medicine)   Name of the patient: Bryan Hicks  [id number]  Oct 22, 1940    Reason for consult: pancreatic mass/ obstructive jaundice   Requesting physician: Dr. Tressia Miners  Date of visit: 04/26/2018    History of presenting illness-patient is a 77 year old male who presented to the ER yesterday with symptoms of generalized weakness and bilateral lower extremity swelling.  During his recent appointment with primary care doctor he was found to have jaundice.  On admission patient was noted to have a total bilirubin of 16 along with alkaline phosphatase of 1507 and elevated AST and ALT respectively.  CBC showed white count of 12.4 and H&H of 9.1/25 with an MCV of 103 and a platelet count of 362.  CT abdomen showed 2.2 x 2.9 cm mass in the pancreatic head concerning for pancreatic neoplasm.  Double duct sign with intra-and extrahepatic ductal dilatation and dilatation of the pancreatic duct.  Peripancreatic fluid collection versus necrotic lesions including a dominant 4.1 x 3.8 cm due to the pancreatic head with mass-effect in the distal bile duct.  Moderate ascites.  CT chest showed solitary 1.2 cm right upper lobe nodule-solitary metastases versus primary lung cancer.  CA-19-9 is pending  GI has been consulted and Dr. Allen Norris performed ERCP with stent placement and biopies today  I met the patient in the recovery room when he was still groggy due to sedation. He reports fatigue and jaundice. deneid any pain  Pain scale- 0   Review of systems- Limited as patient was just coming out of sedation from ERCP   Review of Systems  Constitutional: Positive for malaise/fatigue. Negative for chills, fever and weight loss.  HENT: Negative for congestion, ear discharge and nosebleeds.   Eyes: Negative for blurred vision.    Respiratory: Negative for cough, hemoptysis, sputum production, shortness of breath and wheezing.   Cardiovascular: Positive for leg swelling. Negative for chest pain, palpitations, orthopnea and claudication.  Gastrointestinal: Negative for abdominal pain, blood in stool, constipation, diarrhea, heartburn, melena, nausea and vomiting.       Jaundice  Genitourinary: Negative for dysuria, flank pain, frequency, hematuria and urgency.  Musculoskeletal: Negative for back pain, joint pain and myalgias.  Skin: Negative for rash.  Neurological: Negative for dizziness, tingling, focal weakness, seizures, weakness and headaches.  Endo/Heme/Allergies: Does not bruise/bleed easily.  Psychiatric/Behavioral: Negative for depression and suicidal ideas. The patient does not have insomnia.     No Known Allergies  Patient Active Problem List   Diagnosis Date Noted  . Pancreatic mass 04/26/2018  . Protein-calorie malnutrition, severe 04/26/2018  . Mild protein-calorie malnutrition ([place]) 01/08/2018  . Pernicious anemia 10/07/2014  . COPD (chronic obstructive pulmonary disease) ([place]) 12/27/2013  . HTN (hypertension), benign 12/27/2013  . Hyperlipidemia associated with type 2 diabetes mellitus ([place]) 12/27/2013  . Type 2 diabetes mellitus with stage 1 chronic kidney disease, without long-term current use of insulin ([place]) 12/27/2013     Past Medical History:  Diagnosis Date  . COPD (chronic obstructive pulmonary disease) ([place])   . Diabetes mellitus without complication ([place])   . Hypertension   . Shortness of breath dyspnea      Past Surgical History:  Procedure Laterality Date  . COLON SURGERY     "twisted intestine"  . HERNIA MESH REMOVAL Right   . INGUINAL HERNIA REPAIR Left 12/04/2014   Procedure: HERNIA REPAIR INGUINAL ADULT;  Surgeon: Rochel Brome, MD;  Location: [facility] ORS;  Service: General;  Laterality: Left;    Social History   Socioeconomic History  . Marital status: Married     Spouse name: Not on file  . Number of children: Not on file  . Years of education: Not on file  . Highest education level: Not on file  Occupational History  . Not on file  Social Needs  . Financial resource strain: Not on file  . Food insecurity:    Worry: Not on file    Inability: Not on file  . Transportation needs:    Medical: Not on file    Non-medical: Not on file  Tobacco Use  . Smoking status: Current Every Day Smoker    Packs/day: 1.00    Years: 30.00    Pack years: 30.00  . Smokeless tobacco: Current User  Substance and Sexual Activity  . Alcohol use: No  . Drug use: No  . Sexual activity: Never  Lifestyle  . Physical activity:    Days per week: Not on file    Minutes per session: Not on file  . Stress: Not on file  Relationships  . Social connections:    Talks on phone: Not on file    Gets together: Not on file    Attends religious service: Not on file    Active member of club or organization: Not on file    Attends meetings of clubs or organizations: Not on file    Relationship status: Not on file  . Intimate partner violence:    Fear of current or ex partner: Not on file    Emotionally abused: Not on file    Physically abused: Not on file    Forced sexual activity: Not on file  Other Topics Concern  . Not on file  Social History Narrative  . Not on file     History reviewed. No pertinent family history.   Current Facility-Administered Medications:  .  0.9 %  sodium chloride infusion, , Intravenous, Continuous, Tahiliani, Varnita B, MD .  dextrose 5 % and 0.45 % NaCl with KCl 20 mEq/L infusion, , Intravenous, Continuous, Lance Coon, MD, Last Rate: 75 mL/hr at 04/26/18 0511 .  enoxaparin (LOVENOX) injection 40 mg, 40 mg, Subcutaneous, Q24H, Lance Coon, MD .  indomethacin (INDOCIN) 50 MG suppository 100 mg, 100 mg, Rectal, Once, Tahiliani, Varnita B, MD .  magnesium chloride (SLOW-MAG) 64 MG SR tablet 64 mg, 1 tablet, Oral, Daily, Lance Coon,  MD, 64 mg at 04/26/18 1226 .  mometasone-formoterol (DULERA) 200-5 MCG/ACT inhaler 2 puff, 2 puff, Inhalation, BID, Lance Coon, MD, 2 puff at 04/26/18 1226 .  vitamin B-12 (CYANOCOBALAMIN) tablet 1,000 mcg, 1,000 mcg, Oral, Daily, Lance Coon, MD, 1,000 mcg at 04/26/18 1226   Physical exam:  Vitals:   04/25/18 2323 04/26/18 0045 04/26/18 0323 04/26/18 0828  BP: (!) 102/59  114/61 122/65  Pulse: 77 87 80 83  Resp: 18 (!) _0 Temp: 99.9 F (37.7 C)  99.1 F (37.3 C) 99.1 F (37.3 C)  TempSrc:   Oral Oral  SpO2: 96% 94% 98% 97%  Weight:   139 lb 8 oz (63.3 kg)   Height:   _1  (1.778 m)    Physical Exam  Constitutional: He is oriented to person, place, and time. He appears well-developed and well-nourished.  Diffuse jaundice noted over skin and mucous membrane  HENT:  Head: Normocephalic and atraumatic.  Eyes: Pupils are equal,  round, and reactive to light. EOM are normal.  sclerae icteric  Neck: Normal range of motion.  Cardiovascular: Normal rate, regular rhythm and normal heart sounds.  Pulmonary/Chest: Effort normal and breath sounds normal.  Abdominal: Soft. Bowel sounds are normal. He exhibits no distension. There is no tenderness.  Musculoskeletal: He exhibits edema.  Neurological: He is alert and oriented to person, place, and time.  Skin: Skin is warm and dry.       CMP Latest Ref Rng & Units 04/26/2018  Glucose 70 - 99 mg/dL 101(H)  BUN 8 - 23 mg/dL 7(L)  Creatinine 0.61 - 1.24 mg/dL <0.30(L)  Sodium 135 - 145 mmol/L 128(L)  Potassium 3.5 - 5.1 mmol/L 3.6  Chloride 98 - 111 mmol/L 94(L)  CO2 22 - 32 mmol/L 25  Calcium 8.9 - 10.3 mg/dL 7.8(L)  Total Protein 6.5 - 8.1 g/dL -  Total Bilirubin 0.3 - 1.2 mg/dL -  Alkaline Phos 38 - 126 U/L -  AST 15 - 41 U/L -  ALT 0 - 44 U/L -   CBC Latest Ref Rng & Units 04/26/2018  WBC 3.8 - 10.6 K/uL 21.1(H)  Hemoglobin 13.0 - 18.0 g/dL 9.3(L)  Hematocrit 40.0 - 52.0 % 25.9(L)  Platelets 150 - 440 K/uL 380     _0 @  Ct Chest W Contrast  Result Date: 04/26/2018 CLINICAL DATA:  Inpatient. Pancreatic head mass with biliary and pancreatic duct dilation, jaundice, abdominal pain. Chest staging. EXAM: CT CHEST WITH CONTRAST TECHNIQUE: Multidetector CT imaging of the chest was performed during intravenous contrast administration. CONTRAST:  3m OMNIPAQUE IOHEXOL 300 MG/ML  SOLN COMPARISON:  04/25/2018 CT abdomen/pelvis. 10/26/2012 chest radiograph. FINDINGS: Cardiovascular: Normal heart size. No significant pericardial effusion/thickening. Three-vessel coronary atherosclerosis. Atherosclerotic nonaneurysmal thoracic aorta. Dilated main pulmonary artery (3.5 cm diameter). No central pulmonary emboli. Mediastinum/Nodes: No discrete thyroid nodules. Unremarkable esophagus. No axillary adenopathy. Mildly enlarged 1.3 cm right hilar node (series 2/image 85). No left hilar adenopathy. No pathologically enlarged mediastinal nodes. Lungs/Pleura: No pneumothorax. Small dependent right pleural effusion. No left pleural effusion. Moderate to severe centrilobular emphysema with diffuse bronchial wall thickening. Solid right upper lobe 1.2 cm pulmonary nodule (series 3/image 73). No lung masses or additional significant pulmonary nodules. Mild-to-moderate elevation of the right hemidiaphragm. Patchy somewhat bandlike consolidation and ground-glass opacity at the right lung base with associated volume loss. Upper abdomen: Small hiatal hernia. Re-demonstration prominent diffuse intrahepatic biliary ductal dilatation and pancreatic duct dilation, unchanged. Small to moderate volume upper abdominal ascites. Partial visualization of necrotic adenopathy in the gastrohepatic ligament, unchanged. Musculoskeletal: No aggressive appearing focal osseous lesions. Mild thoracic spondylosis. IMPRESSION: 1. Solitary 1.2 cm solid right upper lobe pulmonary nodule, malignancy not excluded, equivocal for solitary metastasis versus primary  bronchogenic carcinoma. 2. Mild right hilar adenopathy, cannot exclude nodal metastatic disease. 3. Small dependent right pleural effusion. 4. Mild-to-moderate elevation of the right hemidiaphragm with patchy somewhat bandlike consolidation and ground-glass opacity at the right lung base with associated volume loss, favor atelectasis/scarring, cannot exclude a component of pneumonia. 5. Redemonstration of ascites, necrotic gastrohepatic ligament adenopathy and biliary and pancreatic dilatation in the upper abdomen, unchanged. 6. Dilated main pulmonary artery, suggesting pulmonary arterial hypertension. 7. Three-vessel coronary atherosclerosis. 8. Small hiatal hernia. Aortic Atherosclerosis (ICD10-I70.0) and Emphysema (ICD10-J43.9). Electronically Signed   By: JIlona SorrelM.D.   On: 04/26/2018 11:19   Ct Abdomen Pelvis W Contrast  Result Date: 04/25/2018 CLINICAL DATA:  Acute abdominal pain, hypoglycemia EXAM: CT ABDOMEN AND PELVIS WITH CONTRAST TECHNIQUE:  Multidetector CT imaging of the abdomen and pelvis was performed using the standard protocol following bolus administration of intravenous contrast. CONTRAST:  162m ISOVUE-300 IOPAMIDOL (ISOVUE-300) INJECTION 61% COMPARISON:  10/21/2012 FINDINGS: Lower chest: Mild right lower lobe atelectasis. Hepatobiliary: Liver is within normal limits. Moderate intrahepatic and extrahepatic ductal dilatation. Common duct measures up to 2.4 cm (coronal image 40) with extrinsic compression by a dominant cystic/necrotic lesion in the porta hepatitis. Gallbladder is unremarkable. No intrahepatic or extrahepatic ductal dilatation. Pancreas: 2.2 x 2.9 cm hypoenhancing lesion in the pancreatic head (series 2/image 36), highly suspicious for pancreatic neoplasm. Severe dilatation of the main pancreatic duct in the body/tail (series 2/image 27). Associated pancreatic atrophy. Peripancreatic fluid collections with enhancement along the anterior aspect of the pancreatic body/tail  (series 2/image 27). Additional 4.1 x 3.8 cm cystic/necrotic lesion in the region of the pancreatic head, with mass effect on the common duct. Spleen: Within normal limits. Adrenals/Urinary Tract: Bilateral adrenal nodules, measuring up to 2.7 cm on the left,similar to 2014 and likely reflecting benign adrenal adenomas. Kidneys are within normal limits.  No hydronephrosis. Bladder is grossly unremarkable. Stomach/Bowel: Stomach is notable for a tiny hiatal hernia. Visualized bowel is grossly unremarkable. Appendix is not discretely visualized. Vascular/Lymphatic: No evidence of abdominal aortic aneurysm. No vascular involvement. Atherosclerotic calcifications of the abdominal aorta and branch vessels. No suspicious abdominopelvic lymphadenopathy. Reproductive: Prostate is grossly unremarkable. Other: Moderate abdominopelvic ascites. Postsurgical changes related to prior right ventral hernia mesh repair. Musculoskeletal: Grade 1 anterolisthesis at L5-S1. IMPRESSION: 2.2 x 2.9 cm hypoenhancing lesion in the pancreatic head, highly suspicious for pancreatic neoplasm. Associated double duct sign with intrahepatic/extrahepatic ductal dilatation and dilatation of the pancreatic duct. Associated atrophy of the pancreatic body/tail. Associated peripancreatic fluid collections versus necrotic lesions, including a dominant 4.1 x 3.8 cm cystic lesion superior to the pancreatic head, with mass effect on the distal common duct. Moderate abdominopelvic ascites. Additional ancillary findings as above. Electronically Signed   By: SJulian HyM.D.   On: 04/25/2018 23:01    Assessment and plan- Patient is a 77y.o. male admitted for generalized weakness and bilateral lower extremity swelling found to have obstructive jaundice.  CT scan shows primary pancreatic head mass concerning for pancreatic cancer  1.  I have reviewed CT chest abdomen and pelvis images independently. Unable to discuss findings with patient in detail  as he is still groggy from sedation for ERCP.   He has evidence of obstructive jaundice given the dilatation of intra-and extrahepatic biliary ducts as well as the elevated bilirubin.  He has undergone ERCP with stent placement and brushings by Dr. WAllen Norristoday.  Not significant fluid was found to drain his ascites.  If brushings from ERCP diagnostic then he would not need any further biopsies.  Overall the findings of ascites as well as gastrohepatic adenopathy is highly concerning for metastatic pancreatic cancer. CA 19-9 is pending  2. I will see him in the clinic next week and see how his bilirubin is trending.  If ERCP biopsies are nonconclusive we may have to do an EUS with biopsy for definitive tissue diagnosis  3.  I will also discuss his case at tumor board next week especially given the 1.2 cm right upper lobe lung nodule as it is unclear if it is metastatic pancreatic cancer versus primary lung cancer  Thank you for this kind referral and the opportunity to participate in the care of this patient   Visit Diagnosis 1. Pancreatic mass   2.  Hypoglycemia   3. Hyponatremia   4. Hypokalemia   5. Anemia, unspecified type   6. Swelling   7. Ascites     Dr. Randa Evens, MD, MPH [facility] [phone] 04/26/2018  10:23 PM

## 2018-04-26 NOTE — Anesthesia Preprocedure Evaluation (Addendum)
Anesthesia Evaluation  Patient identified by MRN, date of birth, ID band Patient awake    Reviewed: Allergy & Precautions, H&P , NPO status , Patient's Chart, lab work & pertinent test results  History of Anesthesia Complications Negative for: history of anesthetic complications  Airway Mallampati: II  TM Distance: >3 FB Neck ROM: Full    Dental  (+) Upper Dentures, Lower Dentures   Pulmonary shortness of breath, neg sleep apnea, COPD, Current Smoker,    breath sounds clear to auscultation- rhonchi (-) wheezing      Cardiovascular hypertension, Pt. on medications (-) CAD, (-) Past MI, (-) Cardiac Stents and (-) CABG  Rhythm:Regular Rate:Normal - Systolic murmurs and - Diastolic murmurs    Neuro/Psych negative neurological ROS  negative psych ROS   GI/Hepatic negative GI ROS, Neg liver ROS,   Endo/Other  diabetes  Renal/GU CRFRenal disease  negative genitourinary   Musculoskeletal negative musculoskeletal ROS (+)   Abdominal (+) - obese,   Peds  Hematology  (+) anemia ,   Anesthesia Other Findings Past Medical History: No date: COPD (chronic obstructive pulmonary disease) ([facility]) No date: Diabetes mellitus without complication ([facility]) No date: Hypertension No date: Shortness of breath dyspnea  Past Surgical History: No date: COLON SURGERY     Comment:  "twisted intestine" No date: HERNIA MESH REMOVAL; Right 12/04/2014: INGUINAL HERNIA REPAIR; Left     Comment:  Procedure: HERNIA REPAIR INGUINAL ADULT;  Surgeon:               Rochel Brome, MD;  Location: [facility] ORS;  Service: General;              Laterality: Left;  BMI    Body Mass Index:  20.02 kg/m      Reproductive/Obstetrics negative OB ROS                            Anesthesia Physical Anesthesia Plan  ASA: III  Anesthesia Plan: General   Post-op Pain Management:    Induction:   PONV Risk Score and Plan: 1 and Propofol  infusion and TIVA  Airway Management Planned: Natural Airway  Additional Equipment:   Intra-op Plan:   Post-operative Plan:   Informed Consent: I have reviewed the patients History and Physical, chart, labs and discussed the procedure including the risks, benefits and alternatives for the proposed anesthesia with the patient or authorized representative who has indicated his/her understanding and acceptance.   Dental Advisory Given  Plan Discussed with: Anesthesiologist, CRNA and Surgeon  Anesthesia Plan Comments:       Anesthesia Quick Evaluation

## 2018-04-26 NOTE — Progress Notes (Signed)
Patient brought to radiology department for possible paracentesis.   Limited US Abdomen shows a minimal amount of fluid which is not amenable to paracentesis today.  Patient states "I think it's gone down a lot since yesterday."  No procedure performed.  Images saved and will be dictated separately.   Brynda Greathouse, MS RD PA-C 2:27 PM

## 2018-04-26 NOTE — Transfer of Care (Signed)
Immediate Anesthesia Transfer of Care Note  Patient: Bryan Hicks  Procedure(s) Performed: ENDOSCOPIC RETROGRADE CHOLANGIOPANCREATOGRAPHY (ERCP) (N/A )  Patient Location: PACU  Anesthesia Type:General  Level of Consciousness: sedated  Airway & Oxygen Therapy: Patient Spontanous Breathing and Patient connected to face mask oxygen  Post-op Assessment: Report given to RN and Post -op Vital signs reviewed and stable  Post vital signs: Reviewed and stable  Last Vitals:  Vitals Value Taken Time  BP    Temp    Pulse 80 04/26/2018  4:30 PM  Resp    SpO2 100 % 04/26/2018  4:30 PM  Vitals shown include unvalidated device data.  Last Pain:  Vitals:   04/26/18 0828  TempSrc: Oral  PainSc: Asleep         Complications: No apparent anesthesia complications

## 2018-04-26 NOTE — Plan of Care (Signed)
  Problem: Activity: Goal: Risk for activity intolerance will decrease Outcome: Progressing   Problem: Nutrition: Goal: Adequate nutrition will be maintained Outcome: Progressing   Problem: Coping: Goal: Level of anxiety will decrease Outcome: Progressing   

## 2018-04-27 ENCOUNTER — Encounter: Payer: Self-pay | Admitting: Gastroenterology

## 2018-04-27 LAB — GLUCOSE, CAPILLARY
Glucose-Capillary: 307 mg/dL — ABNORMAL HIGH (ref 70–99)
Glucose-Capillary: 310 mg/dL — ABNORMAL HIGH (ref 70–99)
Glucose-Capillary: 312 mg/dL — ABNORMAL HIGH (ref 70–99)
Glucose-Capillary: 389 mg/dL — ABNORMAL HIGH (ref 70–99)

## 2018-04-27 LAB — COMPREHENSIVE METABOLIC PANEL
ALT: 108 U/L — AB (ref 0–44)
AST: 94 U/L — AB (ref 15–41)
Albumin: 2.1 g/dL — ABNORMAL LOW (ref 3.5–5.0)
Alkaline Phosphatase: 1117 U/L — ABNORMAL HIGH (ref 38–126)
Anion gap: 9 (ref 5–15)
BILIRUBIN TOTAL: 11.7 mg/dL — AB (ref 0.3–1.2)
BUN: 11 mg/dL (ref 8–23)
CALCIUM: 8 mg/dL — AB (ref 8.9–10.3)
CO2: 26 mmol/L (ref 22–32)
CREATININE: 0.49 mg/dL — AB (ref 0.61–1.24)
Chloride: 92 mmol/L — ABNORMAL LOW (ref 98–111)
GFR calc Af Amer: >60 mL/min (ref >60)
GLUCOSE: 312 mg/dL — AB (ref 70–99)
POTASSIUM: 4.4 mmol/L (ref 3.5–5.1)
SODIUM: 127 mmol/L — AB (ref 135–145)
Total Protein: 5.4 g/dL — ABNORMAL LOW (ref 6.5–8.1)

## 2018-04-27 LAB — CBC WITH DIFFERENTIAL/PLATELET
BAND NEUTROPHILS: 2 %
BASOS ABS: 0 10*3/uL (ref 0–0.1)
BASOS PCT: 0 %
Blasts: 0 %
EOS ABS: 0 10*3/uL (ref 0–0.7)
Eosinophils Relative: 0 %
HCT: 28.9 % — ABNORMAL LOW (ref 40.0–52.0)
Hemoglobin: 9.8 g/dL — ABNORMAL LOW (ref 13.0–18.0)
LYMPHS PCT: 1 %
Lymphs Abs: 0.1 10*3/uL — ABNORMAL LOW (ref 1.0–3.6)
MCH: 35.9 pg — AB (ref 26.0–34.0)
MCHC: 33.8 g/dL (ref 32.0–36.0)
MCV: 106.2 fL — AB (ref 80.0–100.0)
METAMYELOCYTES PCT: 0 %
MONO ABS: 0.4 10*3/uL (ref 0.2–1.0)
Monocytes Relative: 3 %
Myelocytes: 0 %
NEUTROS ABS: 14.3 10*3/uL — AB (ref 1.4–6.5)
Neutrophils Relative %: 94 %
OTHER: 0 %
PLATELETS: 434 10*3/uL (ref 150–440)
Promyelocytes Relative: 0 %
RBC: 2.72 MIL/uL — ABNORMAL LOW (ref 4.40–5.90)
RDW: 16.9 % — AB (ref 11.5–14.5)
Smear Review: ADEQUATE
WBC: 14.8 10*3/uL — ABNORMAL HIGH (ref 3.8–10.6)
nRBC: 0 /100 WBC

## 2018-04-27 LAB — LIPASE, BLOOD: Lipase: 72 U/L — ABNORMAL HIGH (ref 11–51)

## 2018-04-27 NOTE — Care Management (Signed)
RNCM spoke with Dr. Verdell Carmine regarding code 72 potential and he wishes for patient to remain inpatient.

## 2018-04-27 NOTE — Progress Notes (Signed)
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   Subjective:  Doing well no pain    Objective: Vital signs in last 24 hours: Vitals:   04/26/18 1713 04/26/18 1925 04/27/18 0427 04/27/18 0728  BP: 125/70 131/82 117/80 124/75  Pulse: 78 78 70 75  Resp: 14 20 16    Temp: 98.1 F (36.7 C) 98.7 F (37.1 C) (!) 97.5 F (36.4 C) 97.8 F (36.6 C)  TempSrc: Oral Oral Oral Oral  SpO2: 95% 95%  100%  Weight:      Height:       Weight change:   Intake/Output Summary (Last 24 hours) at 04/27/2018 1115 Last data filed at 04/27/2018 2725 Gross per 24 hour  Intake 500 ml  Output 636 ml  Net -136 ml     Exam:  Icterus++ Heart:: Regular rate and rhythm, S1S2 present or without murmur or extra heart sounds Lungs: normal, clear to auscultation and clear to auscultation and percussion Abdomen: soft, nontender, normal bowel sounds   Lab Results: @LABTEST2 @ Micro Results: Recent Results (from the past 240 hour(s))  CULTURE, BLOOD (ROUTINE X 2) w Reflex to ID Panel     Status: None (Preliminary result)   Collection Time: 04/26/18  8:12 AM  Result Value Ref Range Status   Specimen Description BLOOD BLOOD LEFT HAND  Final   Special Requests   Final    BOTTLES DRAWN AEROBIC AND ANAEROBIC Blood Culture adequate volume   Culture   Final    NO GROWTH 1 DAY Performed at [facility], [address], [place], [place] [address]    Report Status PENDING  Incomplete  CULTURE, BLOOD (ROUTINE X 2) w Reflex to ID Panel     Status: None (Preliminary result)   Collection Time: 04/26/18  8:12 AM  Result Value Ref Range Status   Specimen Description BLOOD BLOOD RIGHT HAND  Final   Special Requests   Final    BOTTLES DRAWN AEROBIC AND ANAEROBIC Blood Culture adequate volume   Culture   Final    NO  GROWTH 1 DAY Performed at [facility], [address] [place], [place] [address]    Report Status PENDING  Incomplete   Studies/Results: Ct Chest W Contrast  Result Date: 04/26/2018 CLINICAL DATA:  Inpatient. Pancreatic head mass with biliary and pancreatic duct dilation, jaundice, abdominal pain. Chest staging. EXAM: CT CHEST WITH CONTRAST TECHNIQUE: Multidetector CT imaging of the chest was performed during intravenous contrast administration. CONTRAST:  8mL OMNIPAQUE IOHEXOL 300 MG/ML  SOLN COMPARISON:  04/25/2018 CT abdomen/pelvis. 10/26/2012 chest radiograph. FINDINGS: Cardiovascular: Normal heart size. No significant pericardial effusion/thickening. Three-vessel coronary atherosclerosis. Atherosclerotic nonaneurysmal thoracic aorta. Dilated main pulmonary artery (3.5 cm diameter). No central pulmonary emboli. Mediastinum/Nodes: No discrete thyroid nodules. Unremarkable esophagus. No axillary adenopathy. Mildly enlarged 1.3 cm right hilar node (series 2/image 85). No left hilar adenopathy. No pathologically enlarged mediastinal nodes. Lungs/Pleura: No pneumothorax. Small dependent right pleural effusion. No left pleural effusion. Moderate to severe centrilobular emphysema with diffuse bronchial wall thickening. Solid right upper lobe 1.2 cm pulmonary nodule (series 3/image 73). No lung masses or additional significant pulmonary nodules. Mild-to-moderate elevation of the right hemidiaphragm. Patchy somewhat bandlike consolidation and ground-glass opacity at the right lung base with associated volume loss. Upper abdomen: Small hiatal hernia. Re-demonstration prominent  diffuse intrahepatic biliary ductal dilatation and pancreatic duct dilation, unchanged. Small to moderate volume upper abdominal ascites. Partial visualization of necrotic adenopathy in the gastrohepatic ligament, unchanged. Musculoskeletal: No aggressive appearing focal osseous lesions. Mild thoracic spondylosis. IMPRESSION: 1.  Solitary 1.2 cm solid right upper lobe pulmonary nodule, malignancy not excluded, equivocal for solitary metastasis versus primary bronchogenic carcinoma. 2. Mild right hilar adenopathy, cannot exclude nodal metastatic disease. 3. Small dependent right pleural effusion. 4. Mild-to-moderate elevation of the right hemidiaphragm with patchy somewhat bandlike consolidation and ground-glass opacity at the right lung base with associated volume loss, favor atelectasis/scarring, cannot exclude a component of pneumonia. 5. Redemonstration of ascites, necrotic gastrohepatic ligament adenopathy and biliary and pancreatic dilatation in the upper abdomen, unchanged. 6. Dilated main pulmonary artery, suggesting pulmonary arterial hypertension. 7. Three-vessel coronary atherosclerosis. 8. Small hiatal hernia. Aortic Atherosclerosis (ICD10-I70.0) and Emphysema (ICD10-J43.9). Electronically Signed   By: Ilona Sorrel M.D.   On: 04/26/2018 11:19   Ct Abdomen Pelvis W Contrast  Result Date: 04/25/2018 CLINICAL DATA:  Acute abdominal pain, hypoglycemia EXAM: CT ABDOMEN AND PELVIS WITH CONTRAST TECHNIQUE: Multidetector CT imaging of the abdomen and pelvis was performed using the standard protocol following bolus administration of intravenous contrast. CONTRAST:  12mL ISOVUE-300 IOPAMIDOL (ISOVUE-300) INJECTION 61% COMPARISON:  10/21/2012 FINDINGS: Lower chest: Mild right lower lobe atelectasis. Hepatobiliary: Liver is within normal limits. Moderate intrahepatic and extrahepatic ductal dilatation. Common duct measures up to 2.4 cm (coronal image 40) with extrinsic compression by a dominant cystic/necrotic lesion in the porta hepatitis. Gallbladder is unremarkable. No intrahepatic or extrahepatic ductal dilatation. Pancreas: 2.2 x 2.9 cm hypoenhancing lesion in the pancreatic head (series 2/image 36), highly suspicious for pancreatic neoplasm. Severe dilatation of the main pancreatic duct in the body/tail (series 2/image 27).  Associated pancreatic atrophy. Peripancreatic fluid collections with enhancement along the anterior aspect of the pancreatic body/tail (series 2/image 27). Additional 4.1 x 3.8 cm cystic/necrotic lesion in the region of the pancreatic head, with mass effect on the common duct. Spleen: Within normal limits. Adrenals/Urinary Tract: Bilateral adrenal nodules, measuring up to 2.7 cm on the left,similar to 2014 and likely reflecting benign adrenal adenomas. Kidneys are within normal limits.  No hydronephrosis. Bladder is grossly unremarkable. Stomach/Bowel: Stomach is notable for a tiny hiatal hernia. Visualized bowel is grossly unremarkable. Appendix is not discretely visualized. Vascular/Lymphatic: No evidence of abdominal aortic aneurysm. No vascular involvement. Atherosclerotic calcifications of the abdominal aorta and branch vessels. No suspicious abdominopelvic lymphadenopathy. Reproductive: Prostate is grossly unremarkable. Other: Moderate abdominopelvic ascites. Postsurgical changes related to prior right ventral hernia mesh repair. Musculoskeletal: Grade 1 anterolisthesis at L5-S1. IMPRESSION: 2.2 x 2.9 cm hypoenhancing lesion in the pancreatic head, highly suspicious for pancreatic neoplasm. Associated double duct sign with intrahepatic/extrahepatic ductal dilatation and dilatation of the pancreatic duct. Associated atrophy of the pancreatic body/tail. Associated peripancreatic fluid collections versus necrotic lesions, including a dominant 4.1 x 3.8 cm cystic lesion superior to the pancreatic head, with mass effect on the distal common duct. Moderate abdominopelvic ascites. Additional ancillary findings as above. Electronically Signed   By: Julian Hy M.D.   On: 04/25/2018 23:01   US Abdomen Limited  Result Date: 04/26/2018 CLINICAL DATA:  Pancreatic mass.  Moderate ascites described on CT EXAM: LIMITED ABDOMEN ULTRASOUND FOR ASCITES TECHNIQUE: Limited ultrasound survey for ascites was performed in  all four abdominal quadrants. COMPARISON:  CT 04/25/2018 FINDINGS: There is a small amount of scattered abdominal ascites. No large pocket for safe paracentesis. IMPRESSION: Small amount of scattered  abdominal ascites.  Paracentesis deferred. Electronically Signed   By: Lucrezia Europe M.D.   On: 04/26/2018 16:14   US Venous Img Lower Bilateral  Result Date: 04/26/2018 CLINICAL DATA:  Bilateral lower extremity swelling EXAM: BILATERAL LOWER EXTREMITY VENOUS DOPPLER ULTRASOUND TECHNIQUE: Gray-scale sonography with graded compression, as well as color Doppler and duplex ultrasound were performed to evaluate the lower extremity deep venous systems from the level of the common femoral vein and including the common femoral, femoral, profunda femoral, popliteal and calf veins including the posterior tibial, peroneal and gastrocnemius veins when visible. The superficial great saphenous vein was also interrogated. Spectral Doppler was utilized to evaluate flow at rest and with distal augmentation maneuvers in the common femoral, femoral and popliteal veins. COMPARISON:  None. FINDINGS: RIGHT LOWER EXTREMITY Common Femoral Vein: No evidence of thrombus. Normal compressibility, respiratory phasicity and response to augmentation. Saphenofemoral Junction: No evidence of thrombus. Normal compressibility and flow on color Doppler imaging. Profunda Femoral Vein: No evidence of thrombus. Normal compressibility and flow on color Doppler imaging. Femoral Vein: No evidence of thrombus. Normal compressibility, respiratory phasicity and response to augmentation. Popliteal Vein: No evidence of thrombus. Normal compressibility, respiratory phasicity and response to augmentation. Calf Veins: No evidence of thrombus. Normal compressibility and flow on color Doppler imaging. Superficial Great Saphenous Vein: No evidence of thrombus. Normal compressibility. Venous Reflux:  None. Other Findings:  None. LEFT LOWER EXTREMITY Common Femoral Vein:  No evidence of thrombus. Normal compressibility, respiratory phasicity and response to augmentation. Saphenofemoral Junction: No evidence of thrombus. Normal compressibility and flow on color Doppler imaging. Profunda Femoral Vein: No evidence of thrombus. Normal compressibility and flow on color Doppler imaging. Femoral Vein: No evidence of thrombus. Normal compressibility, respiratory phasicity and response to augmentation. Popliteal Vein: No evidence of thrombus. Normal compressibility, respiratory phasicity and response to augmentation. Calf Veins: No evidence of thrombus. Normal compressibility and flow on color Doppler imaging. Superficial Great Saphenous Vein: No evidence of thrombus. Normal compressibility. Venous Reflux:  None. Other Findings: Bilateral lower extremity subcutaneous edema noted. Bilateral peripheral atherosclerosis evident. IMPRESSION: No evidence of deep venous thrombosis. Electronically Signed   By: Jerilynn Mages.  Shick M.D.   On: 04/26/2018 15:05   Dg C-arm 1-60 Min-no Report  Result Date: 04/26/2018 Fluoroscopy was utilized by the requesting physician.  No radiographic interpretation.   Medications: I have reviewed the patient's current medications. Scheduled Meds: . enoxaparin (LOVENOX) injection  40 mg Subcutaneous Q24H  . indomethacin  100 mg Rectal Once  . insulin aspart  0-5 Units Subcutaneous QHS  . insulin aspart  0-9 Units Subcutaneous TID WC  . magnesium chloride  1 tablet Oral Daily  . mometasone-formoterol  2 puff Inhalation BID  . vitamin B-12  1,000 mcg Oral Daily   Continuous Infusions: PRN Meds:.   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  Plan: 1. Advance diet slowly and if ok can go home later today  2. Follow up with oncology as an  outpatient, await biopsy results   I will sign off.  Please call me if any further GI concerns or questions.  We would like to thank you for the opportunity to participate in the care of Bryan Hicks.      LOS: 1 day   Jonathon Bellows, MD 04/27/2018, 11:15 AM

## 2018-04-27 NOTE — Discharge Summary (Signed)
[place] [facility] [place] NAME: Bryan Hicks    MR#:  [id number]  DATE OF BIRTH:  1940/12/08  DATE OF ADMISSION:  04/25/2018 ADMITTING PHYSICIAN: Harrie Foreman, MD  DATE OF DISCHARGE: 04/27/2018  PRIMARY CARE PHYSICIAN: Kirk Ruths, MD    ADMISSION DIAGNOSIS:  Hypokalemia [E87.6] Hyponatremia [E87.1] Hypoglycemia [E16.2] Pancreatic mass [K86.89] Anemia, unspecified type [D64.9]  DISCHARGE DIAGNOSIS:  Principal Problem:   Pancreatic mass Active Problems:   COPD (chronic obstructive pulmonary disease) (HCC)   HTN (hypertension), benign   Hyperlipidemia associated with type 2 diabetes mellitus (HCC)   Type 2 diabetes mellitus with stage 1 chronic kidney disease, without long-term current use of insulin (HCC)   Mild protein-calorie malnutrition (HCC)   Protein-calorie malnutrition, severe   Neoplasm of digestive system   Obstruction of bile duct   Other specified diseases of biliary tract   SECONDARY DIAGNOSIS:   Past Medical History:  Diagnosis Date  . COPD (chronic obstructive pulmonary disease) ([place])   . Diabetes mellitus without complication ([place])   . Hypertension   . Shortness of breath dyspnea     HOSPITAL COURSE:   77 year old male with past medical history of COPD, diabetes, hypertension presented to the hospital due chills, bilateral lower extremity swelling and hypoglycemia and underwent a CT abdomen pelvis which showed a pancreatic mass with biliary obstruction.  1.  Obstructive jaundice-secondary to the pancreatic mass.  Seen by gastroenterology, status post ERCP with CBD stent placement.  Patient's bilirubins have trended down and he feels better.  2.  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  Status post ERCP with brushings and biopsy. - Seen by oncology and plan for outpatient follow-up once the results are back. -CA-19-9 pending.  3.  Diabetes type 2 without complication-continue Lantus,  metformin.  4.  COPD-no acute exacerbation-continue Advair.  Discharge home today with outpatient Oncology follow up.   DISCHARGE CONDITIONS:   Stable.   CONSULTS OBTAINED:  Treatment Team:  Sindy Guadeloupe, MD Virgel Manifold, MD  DRUG ALLERGIES:  No Known Allergies  DISCHARGE MEDICATIONS:   Allergies as of 04/27/2018   No Known Allergies     Medication List    TAKE these medications   aspirin EC 81 MG tablet Take 81 mg by mouth every other day.   Fluticasone-Salmeterol 250-50 MCG/DOSE Aepb Commonly known as:  ADVAIR Inhale 1 puff into the lungs 2 (two) times daily.   LANTUS SOLOSTAR 100 UNIT/ML Solostar Pen Generic drug:  Insulin Glargine Inject 22 Units into the skin daily.   magnesium chloride 64 MG Tbec SR tablet Commonly known as:  SLOW-MAG Take 1 tablet by mouth daily.   metFORMIN 500 MG 24 hr tablet Commonly known as:  GLUCOPHAGE-XR Take 1,000 mg by mouth 2 (two) times daily.   vitamin B-12 1000 MCG tablet Commonly known as:  CYANOCOBALAMIN Take 1,000 mcg by mouth daily.         DISCHARGE INSTRUCTIONS:   DIET:  Diabetic diet  DISCHARGE CONDITION:  Stable  ACTIVITY:  Activity as tolerated  OXYGEN:  Home Oxygen: No.   Oxygen Delivery: room air  DISCHARGE LOCATION:  home   If you experience worsening of your admission symptoms, develop shortness of breath, life threatening emergency, suicidal or homicidal thoughts you must seek medical attention immediately by calling 911 or calling your MD immediately  if symptoms less severe.  You Must read complete instructions/literature along with all the possible adverse reactions/side effects for all the  Medicines you take and that have been prescribed to you. Take any new Medicines after you have completely understood and accpet all the possible adverse reactions/side effects.   Please note  You were cared for by a hospitalist during your hospital stay. If you have any questions about  your discharge medications or the care you received while you were in the hospital after you are discharged, you can call the unit and asked to speak with the hospitalist on call if the hospitalist that took care of you is not available. Once you are discharged, your primary care physician will handle any further medical issues. Please note that NO REFILLS for any discharge medications will be authorized once you are discharged, as it is imperative that you return to your primary care physician (or establish a relationship with a primary care physician if you do not have one) for your aftercare needs so that they can reassess your need for medications and monitor your lab values.     Today   Status post ERCP with CBD stent placement yesterday.  Bilirubins have trended down, his jaundice is improved.  Patient denies any abdominal pain nausea vomiting.  Tolerating p.o. well and will discharge home with outpatient oncology follow-up.  VITAL SIGNS:  Blood pressure 124/75, pulse 75, temperature 97.8 F (36.6 C), temperature source Oral, resp. rate 16, height 5\' 10"  (1.778 m), weight 63.3 kg, SpO2 100 %.  I/O:    Intake/Output Summary (Last 24 hours) at 04/27/2018 1512 Last data filed at 04/27/2018 0728 Gross per 24 hour  Intake 500 ml  Output 636 ml  Net -136 ml    PHYSICAL EXAMINATION:  GENERAL:  77 y.o.-year-old patient lying in the bed with no acute distress.  EYES: Pupils equal, round, reactive to light and accommodation. + scleral icterus. Extraocular muscles intact.  HEENT: Head atraumatic, normocephalic. Oropharynx and nasopharynx clear.  NECK:  Supple, no jugular venous distention. No thyroid enlargement, no tenderness.  LUNGS: Normal breath sounds bilaterally, no wheezing, rales,rhonchi. No use of accessory muscles of respiration.  CARDIOVASCULAR: S1, S2 normal. No murmurs, rubs, or gallops.  ABDOMEN: Soft, non-tender, non-distended. Bowel sounds present. No organomegaly or mass.   EXTREMITIES: No pedal edema, cyanosis, or clubbing.  NEUROLOGIC: Cranial nerves II through XII are intact. No focal motor or sensory defecits b/l.  PSYCHIATRIC: The patient is alert and oriented x 3.  SKIN: No obvious rash, lesion, or ulcer.   DATA REVIEW:   CBC Recent Labs  Lab 04/27/18 0406  WBC 14.8*  HGB 9.8*  HCT 28.9*  PLT 434    Chemistries  Recent Labs  Lab 04/27/18 0406  NA 127*  K 4.4  CL 92*  CO2 26  GLUCOSE 312*  BUN 11  CREATININE 0.49*  CALCIUM 8.0*  AST 94*  ALT 108*  ALKPHOS 1,117*  BILITOT 11.7*    Cardiac Enzymes No results for input(s): TROPONINI in the last 168 hours.  Microbiology Results  Results for orders placed or performed during the hospital encounter of 04/25/18  CULTURE, BLOOD (ROUTINE X 2) w Reflex to ID Panel     Status: None (Preliminary result)   Collection Time: 04/26/18  8:12 AM  Result Value Ref Range Status   Specimen Description BLOOD BLOOD LEFT HAND  Final   Special Requests   Final    BOTTLES DRAWN AEROBIC AND ANAEROBIC Blood Culture adequate volume   Culture   Final    NO GROWTH 1 DAY Performed at [facility], [address] [place], [place] [address]    Report Status PENDING  Incomplete  CULTURE, BLOOD (ROUTINE X 2) w Reflex to ID Panel     Status: None (Preliminary result)   Collection Time: 04/26/18  8:12 AM  Result Value Ref Range Status   Specimen Description BLOOD BLOOD RIGHT HAND  Final   Special Requests   Final    BOTTLES DRAWN AEROBIC AND ANAEROBIC Blood Culture adequate volume   Culture   Final    NO GROWTH 1 DAY Performed at [facility], [address] [place], [place] [address]    Report Status PENDING  Incomplete    RADIOLOGY:  Ct Chest W Contrast  Result Date: 04/26/2018 CLINICAL DATA:  Inpatient. Pancreatic head mass with biliary and pancreatic duct dilation, jaundice, abdominal pain. Chest staging. EXAM: CT CHEST WITH CONTRAST TECHNIQUE: Multidetector CT imaging of  the chest was performed during intravenous contrast administration. CONTRAST:  59mL OMNIPAQUE IOHEXOL 300 MG/ML  SOLN COMPARISON:  04/25/2018 CT abdomen/pelvis. 10/26/2012 chest radiograph. FINDINGS: Cardiovascular: Normal heart size. No significant pericardial effusion/thickening. Three-vessel coronary atherosclerosis. Atherosclerotic nonaneurysmal thoracic aorta. Dilated main pulmonary artery (3.5 cm diameter). No central pulmonary emboli. Mediastinum/Nodes: No discrete thyroid nodules. Unremarkable esophagus. No axillary adenopathy. Mildly enlarged 1.3 cm right hilar node (series 2/image 85). No left hilar adenopathy. No pathologically enlarged mediastinal nodes. Lungs/Pleura: No pneumothorax. Small dependent right pleural effusion. No left pleural effusion. Moderate to severe centrilobular emphysema with diffuse bronchial wall thickening. Solid right upper lobe 1.2 cm pulmonary nodule (series 3/image 73). No lung masses or additional significant pulmonary nodules. Mild-to-moderate elevation of the right hemidiaphragm. Patchy somewhat bandlike consolidation and ground-glass opacity at the right lung base with associated volume loss. Upper abdomen: Small hiatal hernia. Re-demonstration prominent diffuse intrahepatic biliary ductal dilatation and pancreatic duct dilation, unchanged. Small to moderate volume upper abdominal ascites. Partial visualization of necrotic adenopathy in the gastrohepatic ligament, unchanged. Musculoskeletal: No aggressive appearing focal osseous lesions. Mild thoracic spondylosis. IMPRESSION: 1. Solitary 1.2 cm solid right upper lobe pulmonary nodule, malignancy not excluded, equivocal for solitary metastasis versus primary bronchogenic carcinoma. 2. Mild right hilar adenopathy, cannot exclude nodal metastatic disease. 3. Small dependent right pleural effusion. 4. Mild-to-moderate elevation of the right hemidiaphragm with patchy somewhat bandlike consolidation and ground-glass opacity at  the right lung base with associated volume loss, favor atelectasis/scarring, cannot exclude a component of pneumonia. 5. Redemonstration of ascites, necrotic gastrohepatic ligament adenopathy and biliary and pancreatic dilatation in the upper abdomen, unchanged. 6. Dilated main pulmonary artery, suggesting pulmonary arterial hypertension. 7. Three-vessel coronary atherosclerosis. 8. Small hiatal hernia. Aortic Atherosclerosis (ICD10-I70.0) and Emphysema (ICD10-J43.9). Electronically Signed   By: Ilona Sorrel M.D.   On: 04/26/2018 11:19   Ct Abdomen Pelvis W Contrast  Result Date: 04/25/2018 CLINICAL DATA:  Acute abdominal pain, hypoglycemia EXAM: CT ABDOMEN AND PELVIS WITH CONTRAST TECHNIQUE: Multidetector CT imaging of the abdomen and pelvis was performed using the standard protocol following bolus administration of intravenous contrast. CONTRAST:  142mL ISOVUE-300 IOPAMIDOL (ISOVUE-300) INJECTION 61% COMPARISON:  10/21/2012 FINDINGS: Lower chest: Mild right lower lobe atelectasis. Hepatobiliary: Liver is within normal limits. Moderate intrahepatic and extrahepatic ductal dilatation. Common duct measures up to 2.4 cm (coronal image 40) with extrinsic compression by a dominant cystic/necrotic lesion in the porta hepatitis. Gallbladder is unremarkable. No intrahepatic or extrahepatic ductal dilatation. Pancreas: 2.2 x 2.9 cm hypoenhancing lesion in the pancreatic head (series 2/image 36), highly suspicious for pancreatic neoplasm. Severe dilatation of the main pancreatic duct in  the body/tail (series 2/image 27). Associated pancreatic atrophy. Peripancreatic fluid collections with enhancement along the anterior aspect of the pancreatic body/tail (series 2/image 27). Additional 4.1 x 3.8 cm cystic/necrotic lesion in the region of the pancreatic head, with mass effect on the common duct. Spleen: Within normal limits. Adrenals/Urinary Tract: Bilateral adrenal nodules, measuring up to 2.7 cm on the left,similar to  2014 and likely reflecting benign adrenal adenomas. Kidneys are within normal limits.  No hydronephrosis. Bladder is grossly unremarkable. Stomach/Bowel: Stomach is notable for a tiny hiatal hernia. Visualized bowel is grossly unremarkable. Appendix is not discretely visualized. Vascular/Lymphatic: No evidence of abdominal aortic aneurysm. No vascular involvement. Atherosclerotic calcifications of the abdominal aorta and branch vessels. No suspicious abdominopelvic lymphadenopathy. Reproductive: Prostate is grossly unremarkable. Other: Moderate abdominopelvic ascites. Postsurgical changes related to prior right ventral hernia mesh repair. Musculoskeletal: Grade 1 anterolisthesis at L5-S1. IMPRESSION: 2.2 x 2.9 cm hypoenhancing lesion in the pancreatic head, highly suspicious for pancreatic neoplasm. Associated double duct sign with intrahepatic/extrahepatic ductal dilatation and dilatation of the pancreatic duct. Associated atrophy of the pancreatic body/tail. Associated peripancreatic fluid collections versus necrotic lesions, including a dominant 4.1 x 3.8 cm cystic lesion superior to the pancreatic head, with mass effect on the distal common duct. Moderate abdominopelvic ascites. Additional ancillary findings as above. Electronically Signed   By: Julian Hy M.D.   On: 04/25/2018 23:01   US Abdomen Limited  Result Date: 04/26/2018 CLINICAL DATA:  Pancreatic mass.  Moderate ascites described on CT EXAM: LIMITED ABDOMEN ULTRASOUND FOR ASCITES TECHNIQUE: Limited ultrasound survey for ascites was performed in all four abdominal quadrants. COMPARISON:  CT 04/25/2018 FINDINGS: There is a small amount of scattered abdominal ascites. No large pocket for safe paracentesis. IMPRESSION: Small amount of scattered abdominal ascites.  Paracentesis deferred. Electronically Signed   By: Lucrezia Europe M.D.   On: 04/26/2018 16:14   US Venous Img Lower Bilateral  Result Date: 04/26/2018 CLINICAL DATA:  Bilateral lower  extremity swelling EXAM: BILATERAL LOWER EXTREMITY VENOUS DOPPLER ULTRASOUND TECHNIQUE: Gray-scale sonography with graded compression, as well as color Doppler and duplex ultrasound were performed to evaluate the lower extremity deep venous systems from the level of the common femoral vein and including the common femoral, femoral, profunda femoral, popliteal and calf veins including the posterior tibial, peroneal and gastrocnemius veins when visible. The superficial great saphenous vein was also interrogated. Spectral Doppler was utilized to evaluate flow at rest and with distal augmentation maneuvers in the common femoral, femoral and popliteal veins. COMPARISON:  None. FINDINGS: RIGHT LOWER EXTREMITY Common Femoral Vein: No evidence of thrombus. Normal compressibility, respiratory phasicity and response to augmentation. Saphenofemoral Junction: No evidence of thrombus. Normal compressibility and flow on color Doppler imaging. Profunda Femoral Vein: No evidence of thrombus. Normal compressibility and flow on color Doppler imaging. Femoral Vein: No evidence of thrombus. Normal compressibility, respiratory phasicity and response to augmentation. Popliteal Vein: No evidence of thrombus. Normal compressibility, respiratory phasicity and response to augmentation. Calf Veins: No evidence of thrombus. Normal compressibility and flow on color Doppler imaging. Superficial Great Saphenous Vein: No evidence of thrombus. Normal compressibility. Venous Reflux:  None. Other Findings:  None. LEFT LOWER EXTREMITY Common Femoral Vein: No evidence of thrombus. Normal compressibility, respiratory phasicity and response to augmentation. Saphenofemoral Junction: No evidence of thrombus. Normal compressibility and flow on color Doppler imaging. Profunda Femoral Vein: No evidence of thrombus. Normal compressibility and flow on color Doppler imaging. Femoral Vein: No evidence of thrombus. Normal compressibility, respiratory phasicity and  response to augmentation. Popliteal Vein: No evidence of thrombus. Normal compressibility, respiratory phasicity and response to augmentation. Calf Veins: No evidence of thrombus. Normal compressibility and flow on color Doppler imaging. Superficial Great Saphenous Vein: No evidence of thrombus. Normal compressibility. Venous Reflux:  None. Other Findings: Bilateral lower extremity subcutaneous edema noted. Bilateral peripheral atherosclerosis evident. IMPRESSION: No evidence of deep venous thrombosis. Electronically Signed   By: Jerilynn Mages.  Shick M.D.   On: 04/26/2018 15:05   Dg C-arm 1-60 Min-no Report  Result Date: 04/26/2018 Fluoroscopy was utilized by the requesting physician.  No radiographic interpretation.      Management plans discussed with the patient, family and they are in agreement.  CODE STATUS:     Code Status Orders  (From admission, onward)         Start     Ordered   04/26/18 0337  Full code  Continuous     04/26/18 0336         TOTAL TIME TAKING CARE OF THIS PATIENT: 40 minutes.    Henreitta Leber M.D on 04/27/2018 at 3:12 PM  Between 7am to 6pm - Pager - [phone]  After 6pm go to [email or web address] - [employer] [facility]  Office  [phone]  CC: Primary care physician; Kirk Ruths, MD

## 2018-04-27 NOTE — Progress Notes (Signed)
Pt to be discharged to home today. Iv's removed. disch  Instructions given to pt and family. No prescrips. disch via w.c. Accompanied by family.

## 2018-04-28 ENCOUNTER — Other Ambulatory Visit: Payer: Self-pay | Admitting: *Deleted

## 2018-04-28 DIAGNOSIS — K8689 Other specified diseases of pancreas: Secondary | ICD-10-CM

## 2018-04-28 LAB — CA 19-9 (SERIAL): CA 19-9: 1988 U/mL — ABNORMAL HIGH (ref 0–35)

## 2018-04-29 ENCOUNTER — Other Ambulatory Visit: Payer: Self-pay | Admitting: Oncology

## 2018-04-29 ENCOUNTER — Telehealth: Payer: Self-pay | Admitting: *Deleted

## 2018-04-29 LAB — GLUCOSE, CAPILLARY: Glucose-Capillary: 148 mg/dL — ABNORMAL HIGH (ref 70–99)

## 2018-04-29 NOTE — Telephone Encounter (Signed)
Called pt and spoke to him about coming in Thursday at 8:45 for labs and see md after that. We are hoping that the bx results will be back by then. He is agreeable to the date/time

## 2018-04-29 NOTE — Telephone Encounter (Signed)
Step son called asking to get appointment with Dr Janese Banks. Patient had procedure Friday with Dr Durwin Reges and it was not good news. He knows the path will not be back yet, but he would like an appointment ASAP. Please advise  2. I will see him in the clinic next week and see how his bilirubin is trending.  If ERCP biopsies are nonconclusive we may have to do an EUS with biopsy for definitive tissue diagnosis  3.  I will also discuss his case at tumor board next week especially given the 1.2 cm right upper lobe lung nodule as it is unclear if it is metastatic pancreatic cancer versus primary lung cancer  Thank you for this kind referral and the opportunity to participate in the care of this patient

## 2018-04-29 NOTE — Telephone Encounter (Signed)
We will be calling him today and he will be seeing me on Thursday to discuss everything.

## 2018-04-29 NOTE — Anesthesia Postprocedure Evaluation (Signed)
Anesthesia Post Note  Patient: Bryan Hicks  Procedure(s) Performed: ENDOSCOPIC RETROGRADE CHOLANGIOPANCREATOGRAPHY (ERCP) (N/A )  Patient location during evaluation: PACU Anesthesia Type: General Level of consciousness: awake and alert and oriented Pain management: pain level controlled Vital Signs Assessment: post-procedure vital signs reviewed and stable Respiratory status: spontaneous breathing Cardiovascular status: blood pressure returned to baseline Anesthetic complications: no     Last Vitals:  Vitals:   04/27/18 0427 04/27/18 0728  BP: 117/80 124/75  Pulse: 70 75  Resp: 16   Temp: (!) 36.4 C 36.6 C  SpO2:  100%    Last Pain:  Vitals:   04/27/18 0728  TempSrc: Oral  PainSc:                  Kamaal Cast

## 2018-04-30 ENCOUNTER — Other Ambulatory Visit: Payer: Self-pay | Admitting: Pathology

## 2018-04-30 LAB — SURGICAL PATHOLOGY

## 2018-05-01 LAB — CULTURE, BLOOD (ROUTINE X 2)
CULTURE: NO GROWTH
Culture: NO GROWTH
Special Requests: ADEQUATE
Special Requests: ADEQUATE

## 2018-05-02 ENCOUNTER — Inpatient Hospital Stay: Payer: Medicare Other | Attending: Oncology

## 2018-05-02 ENCOUNTER — Encounter: Payer: Self-pay | Admitting: Oncology

## 2018-05-02 ENCOUNTER — Other Ambulatory Visit: Payer: Self-pay

## 2018-05-02 ENCOUNTER — Inpatient Hospital Stay (HOSPITAL_BASED_OUTPATIENT_CLINIC_OR_DEPARTMENT_OTHER): Payer: Medicare Other | Admitting: Oncology

## 2018-05-02 ENCOUNTER — Telehealth: Payer: Self-pay | Admitting: *Deleted

## 2018-05-02 VITALS — BP 160/72 | HR 125 | Temp 97.8°F | Resp 18 | Wt 142.4 lb

## 2018-05-02 DIAGNOSIS — E876 Hypokalemia: Secondary | ICD-10-CM | POA: Diagnosis present

## 2018-05-02 DIAGNOSIS — K831 Obstruction of bile duct: Secondary | ICD-10-CM | POA: Diagnosis not present

## 2018-05-02 DIAGNOSIS — N181 Chronic kidney disease, stage 1: Secondary | ICD-10-CM | POA: Diagnosis present

## 2018-05-02 DIAGNOSIS — C259 Malignant neoplasm of pancreas, unspecified: Secondary | ICD-10-CM

## 2018-05-02 DIAGNOSIS — E785 Hyperlipidemia, unspecified: Secondary | ICD-10-CM | POA: Diagnosis present

## 2018-05-02 DIAGNOSIS — D649 Anemia, unspecified: Secondary | ICD-10-CM | POA: Diagnosis present

## 2018-05-02 DIAGNOSIS — Z808 Family history of malignant neoplasm of other organs or systems: Secondary | ICD-10-CM

## 2018-05-02 DIAGNOSIS — E871 Hypo-osmolality and hyponatremia: Secondary | ICD-10-CM | POA: Diagnosis not present

## 2018-05-02 DIAGNOSIS — Z7189 Other specified counseling: Secondary | ICD-10-CM

## 2018-05-02 DIAGNOSIS — I129 Hypertensive chronic kidney disease with stage 1 through stage 4 chronic kidney disease, or unspecified chronic kidney disease: Secondary | ICD-10-CM | POA: Diagnosis present

## 2018-05-02 DIAGNOSIS — J449 Chronic obstructive pulmonary disease, unspecified: Secondary | ICD-10-CM | POA: Diagnosis present

## 2018-05-02 DIAGNOSIS — K8689 Other specified diseases of pancreas: Secondary | ICD-10-CM

## 2018-05-02 DIAGNOSIS — G9341 Metabolic encephalopathy: Secondary | ICD-10-CM | POA: Diagnosis present

## 2018-05-02 DIAGNOSIS — E11649 Type 2 diabetes mellitus with hypoglycemia without coma: Principal | ICD-10-CM | POA: Diagnosis present

## 2018-05-02 DIAGNOSIS — E43 Unspecified severe protein-calorie malnutrition: Secondary | ICD-10-CM | POA: Diagnosis not present

## 2018-05-02 DIAGNOSIS — Z7951 Long term (current) use of inhaled steroids: Secondary | ICD-10-CM

## 2018-05-02 DIAGNOSIS — R509 Fever, unspecified: Secondary | ICD-10-CM | POA: Diagnosis present

## 2018-05-02 DIAGNOSIS — G4700 Insomnia, unspecified: Secondary | ICD-10-CM | POA: Diagnosis present

## 2018-05-02 DIAGNOSIS — E1122 Type 2 diabetes mellitus with diabetic chronic kidney disease: Secondary | ICD-10-CM | POA: Diagnosis present

## 2018-05-02 DIAGNOSIS — Z66 Do not resuscitate: Secondary | ICD-10-CM | POA: Diagnosis not present

## 2018-05-02 DIAGNOSIS — Z7982 Long term (current) use of aspirin: Secondary | ICD-10-CM

## 2018-05-02 DIAGNOSIS — F17210 Nicotine dependence, cigarettes, uncomplicated: Secondary | ICD-10-CM | POA: Diagnosis present

## 2018-05-02 DIAGNOSIS — Z7984 Long term (current) use of oral hypoglycemic drugs: Secondary | ICD-10-CM

## 2018-05-02 DIAGNOSIS — Z800 Family history of malignant neoplasm of digestive organs: Secondary | ICD-10-CM

## 2018-05-02 DIAGNOSIS — G893 Neoplasm related pain (acute) (chronic): Secondary | ICD-10-CM

## 2018-05-02 LAB — CBC WITH DIFFERENTIAL/PLATELET
ABS IMMATURE GRANULOCYTES: 0.2 10*3/uL — AB (ref 0.00–0.07)
BASOS PCT: 0 %
Basophils Absolute: 0 10*3/uL (ref 0.0–0.1)
EOS PCT: 0 %
Eosinophils Absolute: 0.1 10*3/uL (ref 0.0–0.5)
HCT: 25.6 % — ABNORMAL LOW (ref 39.0–52.0)
Hemoglobin: 8.6 g/dL — ABNORMAL LOW (ref 13.0–17.0)
Immature Granulocytes: 1 %
LYMPHS PCT: 5 %
Lymphs Abs: 0.9 10*3/uL (ref 0.7–4.0)
MCH: 35.4 pg — ABNORMAL HIGH (ref 26.0–34.0)
MCHC: 33.6 g/dL (ref 30.0–36.0)
MCV: 105.3 fL — AB (ref 80.0–100.0)
MONO ABS: 1.1 10*3/uL — AB (ref 0.1–1.0)
MONOS PCT: 6 %
Neutro Abs: 16.8 10*3/uL — ABNORMAL HIGH (ref 1.7–7.7)
Neutrophils Relative %: 88 %
Platelets: 490 10*3/uL — ABNORMAL HIGH (ref 150–400)
RBC: 2.43 MIL/uL — ABNORMAL LOW (ref 4.22–5.81)
RDW: 15.1 % (ref 11.5–15.5)
WBC: 19.1 10*3/uL — ABNORMAL HIGH (ref 4.0–10.5)
nRBC: 0 % (ref 0.0–0.2)

## 2018-05-02 LAB — COMPREHENSIVE METABOLIC PANEL
ALBUMIN: 2.4 g/dL — AB (ref 3.5–5.0)
ALT: 67 U/L — AB (ref 0–44)
AST: 58 U/L — AB (ref 15–41)
Alkaline Phosphatase: 814 U/L — ABNORMAL HIGH (ref 38–126)
Anion gap: 12 (ref 5–15)
BUN: 10 mg/dL (ref 8–23)
CO2: 30 mmol/L (ref 22–32)
CREATININE: 0.42 mg/dL — AB (ref 0.61–1.24)
Calcium: 8.6 mg/dL — ABNORMAL LOW (ref 8.9–10.3)
Chloride: 90 mmol/L — ABNORMAL LOW (ref 98–111)
GFR calc Af Amer: >60 mL/min (ref >60)
GFR calc non Af Amer: >60 mL/min (ref >60)
GLUCOSE: 84 mg/dL (ref 70–99)
POTASSIUM: 3.2 mmol/L — AB (ref 3.5–5.1)
Sodium: 132 mmol/L — ABNORMAL LOW (ref 135–145)
TOTAL PROTEIN: 6.2 g/dL — AB (ref 6.5–8.1)
Total Bilirubin: 6.2 mg/dL — ABNORMAL HIGH (ref 0.3–1.2)

## 2018-05-02 MED ORDER — OXYCODONE HCL 5 MG PO TABS: 5.0000 mg | ORAL_TABLET | Freq: Three times a day (TID) | ORAL | 0 refills | Status: DC | PRN

## 2018-05-02 NOTE — Progress Notes (Signed)
Met with Mr. Schnitzer and his family before and during consult with Dr. Janese Banks. Introduced Therapist, nutritional and provided contact information for future needs. Referral placed for dietician and palliative care. Genetics will be discussed at a later visit. Oncology Nurse Navigator Documentation  Navigator Location: [facility] (05/02/18 1000)   )Navigator Encounter Type: Initial MedOnc (05/02/18 1000)                     Patient Visit Type: MedOnc;Initial (05/02/18 1000) Treatment Phase: Pre-Tx/Tx Discussion (05/02/18 1000) Barriers/Navigation Needs: No barriers at this time;Education (05/02/18 1000)   Interventions: Referrals (05/02/18 1000) Referrals: Nutrition/dietician (05/02/18 1000)          Acuity: Level 2 (05/02/18 1000)   Acuity Level 2: Initial guidance, education and coordination as needed;Educational needs;Ongoing guidance and education throughout treatment as needed (05/02/18 1000)     Time Spent with Patient: 30 (05/02/18 1000)

## 2018-05-02 NOTE — Progress Notes (Signed)
New pt visit, family with pt.  Pt is visually jaundice, reports pain in left side of abdomen, ankles with pitting edema.  States "I want to eat but feel full all the time".

## 2018-05-02 NOTE — Telephone Encounter (Signed)
New oxycodone prescription

## 2018-05-03 ENCOUNTER — Other Ambulatory Visit: Payer: Self-pay

## 2018-05-03 ENCOUNTER — Inpatient Hospital Stay
Admission: EM | Admit: 2018-05-03 | Discharge: 2018-05-05 | DRG: 637 | Disposition: A | Discharge status: Home / Self Care | Payer: Medicare Other | Attending: Internal Medicine | Admitting: Internal Medicine

## 2018-05-03 ENCOUNTER — Encounter: Payer: Self-pay | Admitting: Oncology

## 2018-05-03 ENCOUNTER — Emergency Department: Payer: Medicare Other

## 2018-05-03 DIAGNOSIS — E876 Hypokalemia: Secondary | ICD-10-CM | POA: Diagnosis not present

## 2018-05-03 DIAGNOSIS — Z800 Family history of malignant neoplasm of digestive organs: Secondary | ICD-10-CM | POA: Diagnosis not present

## 2018-05-03 DIAGNOSIS — Z7984 Long term (current) use of oral hypoglycemic drugs: Secondary | ICD-10-CM | POA: Diagnosis not present

## 2018-05-03 DIAGNOSIS — C259 Malignant neoplasm of pancreas, unspecified: Secondary | ICD-10-CM | POA: Diagnosis not present

## 2018-05-03 DIAGNOSIS — E871 Hypo-osmolality and hyponatremia: Secondary | ICD-10-CM | POA: Diagnosis not present

## 2018-05-03 DIAGNOSIS — N181 Chronic kidney disease, stage 1: Secondary | ICD-10-CM | POA: Diagnosis not present

## 2018-05-03 DIAGNOSIS — R4182 Altered mental status, unspecified: Secondary | ICD-10-CM | POA: Diagnosis present

## 2018-05-03 DIAGNOSIS — J449 Chronic obstructive pulmonary disease, unspecified: Secondary | ICD-10-CM | POA: Diagnosis not present

## 2018-05-03 DIAGNOSIS — R609 Edema, unspecified: Secondary | ICD-10-CM

## 2018-05-03 DIAGNOSIS — Z66 Do not resuscitate: Secondary | ICD-10-CM | POA: Diagnosis not present

## 2018-05-03 DIAGNOSIS — F17210 Nicotine dependence, cigarettes, uncomplicated: Secondary | ICD-10-CM | POA: Diagnosis not present

## 2018-05-03 DIAGNOSIS — E785 Hyperlipidemia, unspecified: Secondary | ICD-10-CM | POA: Diagnosis not present

## 2018-05-03 DIAGNOSIS — I129 Hypertensive chronic kidney disease with stage 1 through stage 4 chronic kidney disease, or unspecified chronic kidney disease: Secondary | ICD-10-CM | POA: Diagnosis not present

## 2018-05-03 DIAGNOSIS — G9341 Metabolic encephalopathy: Secondary | ICD-10-CM | POA: Diagnosis not present

## 2018-05-03 DIAGNOSIS — E1122 Type 2 diabetes mellitus with diabetic chronic kidney disease: Secondary | ICD-10-CM | POA: Diagnosis not present

## 2018-05-03 DIAGNOSIS — E11649 Type 2 diabetes mellitus with hypoglycemia without coma: Secondary | ICD-10-CM | POA: Diagnosis not present

## 2018-05-03 DIAGNOSIS — D649 Anemia, unspecified: Secondary | ICD-10-CM | POA: Diagnosis not present

## 2018-05-03 DIAGNOSIS — Z808 Family history of malignant neoplasm of other organs or systems: Secondary | ICD-10-CM | POA: Diagnosis not present

## 2018-05-03 DIAGNOSIS — Z7951 Long term (current) use of inhaled steroids: Secondary | ICD-10-CM | POA: Diagnosis not present

## 2018-05-03 DIAGNOSIS — Z7982 Long term (current) use of aspirin: Secondary | ICD-10-CM | POA: Diagnosis not present

## 2018-05-03 DIAGNOSIS — E162 Hypoglycemia, unspecified: Secondary | ICD-10-CM | POA: Diagnosis present

## 2018-05-03 LAB — CBC WITH DIFFERENTIAL/PLATELET
Abs Immature Granulocytes: 0.15 10*3/uL — ABNORMAL HIGH (ref 0.00–0.07)
BASOS ABS: 0 10*3/uL (ref 0.0–0.1)
Basophils Relative: 0 %
EOS PCT: 0 %
Eosinophils Absolute: 0.1 10*3/uL (ref 0.0–0.5)
HEMATOCRIT: 26.5 % — AB (ref 39.0–52.0)
Hemoglobin: 8.9 g/dL — ABNORMAL LOW (ref 13.0–17.0)
IMMATURE GRANULOCYTES: 1 %
LYMPHS ABS: 0.7 10*3/uL (ref 0.7–4.0)
Lymphocytes Relative: 4 %
MCH: 35.3 pg — ABNORMAL HIGH (ref 26.0–34.0)
MCHC: 33.6 g/dL (ref 30.0–36.0)
MCV: 105.2 fL — ABNORMAL HIGH (ref 80.0–100.0)
Monocytes Absolute: 1.1 10*3/uL — ABNORMAL HIGH (ref 0.1–1.0)
Monocytes Relative: 6 %
NEUTROS PCT: 89 %
NRBC: 0 % (ref 0.0–0.2)
Neutro Abs: 16.9 10*3/uL — ABNORMAL HIGH (ref 1.7–7.7)
PLATELETS: 552 10*3/uL — AB (ref 150–400)
RBC: 2.52 MIL/uL — AB (ref 4.22–5.81)
RDW: 14.4 % (ref 11.5–15.5)
WBC: 19 10*3/uL — ABNORMAL HIGH (ref 4.0–10.5)

## 2018-05-03 LAB — BASIC METABOLIC PANEL
Anion gap: 11 (ref 5–15)
BUN: 16 mg/dL (ref 8–23)
CALCIUM: 8.2 mg/dL — AB (ref 8.9–10.3)
CO2: 29 mmol/L (ref 22–32)
Chloride: 89 mmol/L — ABNORMAL LOW (ref 98–111)
Creatinine, Ser: 0.57 mg/dL — ABNORMAL LOW (ref 0.61–1.24)
Glucose, Bld: 36 mg/dL — CL (ref 70–99)
POTASSIUM: 3.1 mmol/L — AB (ref 3.5–5.1)
SODIUM: 129 mmol/L — AB (ref 135–145)

## 2018-05-03 LAB — TROPONIN I: TROPONIN I: 0.04 ng/mL — AB (ref <0.03)

## 2018-05-03 LAB — GLUCOSE, CAPILLARY
GLUCOSE-CAPILLARY: 44 mg/dL — AB (ref 70–99)
Glucose-Capillary: 20 mg/dL — CL (ref 70–99)
Glucose-Capillary: 70 mg/dL (ref 70–99)
Glucose-Capillary: 74 mg/dL (ref 70–99)
Glucose-Capillary: 92 mg/dL (ref 70–99)

## 2018-05-03 LAB — LACTIC ACID, PLASMA
Lactic Acid, Venous: 2.4 mmol/L (ref 0.5–1.9)
Lactic Acid, Venous: 2.5 mmol/L (ref 0.5–1.9)
Lactic Acid, Venous: 1.9 mmol/L (ref 0.5–1.9)

## 2018-05-03 LAB — HEPATIC FUNCTION PANEL
ALT: 62 U/L — AB (ref 0–44)
AST: 54 U/L — ABNORMAL HIGH (ref 15–41)
Albumin: 2.5 g/dL — ABNORMAL LOW (ref 3.5–5.0)
Alkaline Phosphatase: 835 U/L — ABNORMAL HIGH (ref 38–126)
Bilirubin, Direct: 3 mg/dL — ABNORMAL HIGH (ref 0.0–0.2)
Indirect Bilirubin: 2.7 mg/dL — ABNORMAL HIGH (ref 0.3–0.9)
TOTAL PROTEIN: 6.5 g/dL (ref 6.5–8.1)
Total Bilirubin: 5.7 mg/dL — ABNORMAL HIGH (ref 0.3–1.2)

## 2018-05-03 LAB — SALICYLATE LEVEL

## 2018-05-03 LAB — ETHANOL: Alcohol, Ethyl (B): <10 mg/dL (ref <10)

## 2018-05-03 LAB — PROTIME-INR
INR: 1.57
Prothrombin Time: 18.6 seconds — ABNORMAL HIGH (ref 11.4–15.2)

## 2018-05-03 LAB — AMMONIA: Ammonia: 24 umol/L (ref 9–35)

## 2018-05-03 LAB — LIPASE, BLOOD: LIPASE: 62 U/L — AB (ref 11–51)

## 2018-05-03 LAB — ACETAMINOPHEN LEVEL

## 2018-05-03 MED ORDER — DEXTROSE 50 % IV SOLN
50.0000 mL | Freq: Once | INTRAVENOUS | Status: AC
  Administered 2018-05-03: 50 mL via INTRAVENOUS
  Filled 2018-05-03: qty 50

## 2018-05-03 MED ORDER — LORAZEPAM 2 MG/ML IJ SOLN
2.0000 mg | Freq: Once | INTRAMUSCULAR | Status: AC
  Administered 2018-05-03: 2 mg via INTRAVENOUS

## 2018-05-03 MED ORDER — DEXTROSE 50 % IV SOLN
INTRAVENOUS | Status: AC
  Administered 2018-05-03: 50 mL via INTRAVENOUS
  Filled 2018-05-03: qty 50

## 2018-05-03 MED ORDER — DEXTROSE-NACL 5-0.45 % IV SOLN
INTRAVENOUS | Status: DC
  Administered 2018-05-03: 22:00:00 via INTRAVENOUS

## 2018-05-03 MED ORDER — DEXTROSE 50 % IV SOLN
50.0000 mL | Freq: Once | INTRAVENOUS | Status: AC
  Administered 2018-05-03: 50 mL via INTRAVENOUS

## 2018-05-03 MED ORDER — SODIUM CHLORIDE 0.9 % IV BOLUS
500.0000 mL | Freq: Once | INTRAVENOUS | Status: AC
  Administered 2018-05-03: 500 mL via INTRAVENOUS

## 2018-05-03 NOTE — ED Triage Notes (Signed)
Pt BIB [facility] from home for altered mental status. Pts family called EMS because pt was altered and not responsive. Upon arrival EMS found pts blood sugar to be 24. Pt has recent diagnosis of terminal pancreatic cancer. Hx of HTN, hypercholesteremia, Afib.   Upon arrival to ED pt uncooperative. Pt alert but confused. Sugar here is 34. Verbal order from MD for 2mg  of Ativan and an amp of D50.

## 2018-05-03 NOTE — ED Provider Notes (Addendum)
[facility] Emergency Department Provider Note ____________________________________________   First MD Initiated Contact with Patient 05/03/18 1949     (approximate)  I have reviewed the triage vital signs and the nursing notes.   HISTORY  Chief Complaint Altered Mental Status and Hypoglycemia  Level 5 caveat: History of present illness limited due to altered mental status  HPI Bryan Hicks is a 77 y.o. male with PMH as noted below and recent diagnosis of metastatic pancreatic cancer who presents with altered mental status and hypoglycemia.  Per EMS, they were called because the patient was found to be unresponsive.  On their arrival the [facility] was 24 and the patient was given D10.  On arrival to the ED the patient was alert but very confused and somewhat agitated.  Repeat FSBG was 34.  Past Medical History:  Diagnosis Date  . COPD (chronic obstructive pulmonary disease) ([place])   . Diabetes mellitus without complication ([place])   . Hypertension   . Shortness of breath dyspnea     Patient Active Problem List   Diagnosis Date Noted  . Pancreatic mass 04/26/2018  . Protein-calorie malnutrition, severe 04/26/2018  . Neoplasm of digestive system   . Obstruction of bile duct   . Other specified diseases of biliary tract   . Mild protein-calorie malnutrition ([place]) 01/08/2018  . Pernicious anemia 10/07/2014  . COPD (chronic obstructive pulmonary disease) ([place]) 12/27/2013  . HTN (hypertension), benign 12/27/2013  . Hyperlipidemia associated with type 2 diabetes mellitus ([place]) 12/27/2013  . Type 2 diabetes mellitus with stage 1 chronic kidney disease, without long-term current use of insulin ([place]) 12/27/2013    Past Surgical History:  Procedure Laterality Date  . COLON SURGERY     "twisted intestine"  . ERCP N/A 04/26/2018   Procedure: ENDOSCOPIC RETROGRADE CHOLANGIOPANCREATOGRAPHY (ERCP);  Surgeon: Lucilla Lame, MD;  Location: [facility] ENDOSCOPY;  Service:  Endoscopy;  Laterality: N/A;  . HERNIA MESH REMOVAL Right   . INGUINAL HERNIA REPAIR Left 12/04/2014   Procedure: HERNIA REPAIR INGUINAL ADULT;  Surgeon: Rochel Brome, MD;  Location: [facility] ORS;  Service: General;  Laterality: Left;    Prior to Admission medications   Medication Sig Start Date End Date Taking? Authorizing Provider  aspirin EC 81 MG tablet Take 81 mg by mouth every other day.    Yes [provider]  Fluticasone-Salmeterol (ADVAIR) 250-50 MCG/DOSE AEPB Inhale 1 puff into the lungs 2 (two) times daily.    Yes [provider]  LANTUS SOLOSTAR 100 UNIT/ML Solostar Pen Inject 22 Units into the skin daily.  03/07/18  Yes [provider]  magnesium chloride (SLOW-MAG) 64 MG TBEC SR tablet Take 1 tablet by mouth daily.   Yes [provider]  metFORMIN (GLUCOPHAGE-XR) 500 MG 24 hr tablet Take 1,000 mg by mouth 2 (two) times daily.  01/22/18  Yes [provider]  oxyCODONE (OXY IR/ROXICODONE) 5 MG immediate release tablet Take 1 tablet (5 mg total) by mouth every 8 (eight) hours as needed for severe pain. 05/02/18  Yes Sindy Guadeloupe, MD  vitamin B-12 (CYANOCOBALAMIN) 1000 MCG tablet Take 1,000 mcg by mouth daily.   Yes [provider]  traZODone (DESYREL) 50 MG tablet  04/30/18   [provider]    Allergies Patient has no known allergies.  Family History  Problem Relation Age of Onset  . Colon cancer Mother   . Melanoma Father     Social History Social History   Tobacco Use  . Smoking  status: Current Every Day Smoker    Packs/day: 1.00    Years: 30.00    Pack years: 30.00  . Smokeless tobacco: Former Network engineer Use Topics  . Alcohol use: No  . Drug use: No    Review of Systems Level 5 caveat: Unable to obtain review of systems due to altered mental status    ____________________________________________   PHYSICAL EXAM:  VITAL SIGNS: ED Triage Vitals  Enc Vitals Group     BP 05/03/18 1946 128/77       Pulse Rate 05/03/18 1946 (!) 121     Resp 05/03/18 1946 18     Temp 05/03/18 1946 98.5 F (36.9 C)     Temp Source 05/03/18 1946 Oral     SpO2 05/03/18 1946 99 %     Weight 05/03/18 1949 138 lb 14.2 oz (63 kg)     Height 05/03/18 1949 5\' 10"  (1.778 m)     Head Circumference --      Peak Flow --      Pain Score --      Pain Loc --      Pain Edu? --      Excl. in [place]? --     Constitutional: Alert, confused.  Weak appearing. Eyes: Conjunctivae are normal.  Scleral icterus. Head: Atraumatic. Nose: No congestion/rhinnorhea. Mouth/Throat: Mucous membranes are somewhat dry.   Neck: Normal range of motion.  Cardiovascular: Normal rate, regular rhythm. Grossly normal heart sounds.  Good peripheral circulation. Respiratory: Normal respiratory effort.  No retractions. Lungs CTAB. Gastrointestinal: Soft with mild right upper abdominal tenderness.  No distention.  Genitourinary: No flank tenderness. Musculoskeletal: 2+ bilateral lower extremity edema.  Extremities warm and well perfused.  Neurologic:  Normal speech and language. No gross focal neurologic deficits are appreciated.  Skin:  Skin is warm and dry. No rash noted. Psychiatric: Agitated and uncooperative.  ____________________________________________   LABS (all labs ordered are listed, but only abnormal results are displayed)  Labs Reviewed  ACETAMINOPHEN LEVEL - Abnormal; Notable for the following components:      Result Value   Acetaminophen (Tylenol), Serum <10 (*)    All other components within normal limits  BASIC METABOLIC PANEL - Abnormal; Notable for the following components:   Sodium 129 (*)    Potassium 3.1 (*)    Chloride 89 (*)    Glucose, Bld 36 (*)    Creatinine, Ser 0.57 (*)    Calcium 8.2 (*)    All other components within normal limits  HEPATIC FUNCTION PANEL - Abnormal; Notable for the following components:   Albumin 2.5 (*)    AST 54 (*)    ALT 62 (*)    Alkaline Phosphatase 835 (*)    Total  Bilirubin 5.7 (*)    Bilirubin, Direct 3.0 (*)    Indirect Bilirubin 2.7 (*)    All other components within normal limits  LIPASE, BLOOD - Abnormal; Notable for the following components:   Lipase 62 (*)    All other components within normal limits  TROPONIN I - Abnormal; Notable for the following components:   Troponin I 0.04 (*)    All other components within normal limits  LACTIC ACID, PLASMA - Abnormal; Notable for the following components:   Lactic Acid, Venous 2.4 (*)    All other components within normal limits  LACTIC ACID, PLASMA - Abnormal; Notable for the following components:   Lactic Acid, Venous 2.5 (*)    All other components within normal  limits  CBC WITH DIFFERENTIAL/PLATELET - Abnormal; Notable for the following components:   WBC 19.0 (*)    RBC 2.52 (*)    Hemoglobin 8.9 (*)    HCT 26.5 (*)    MCV 105.2 (*)    MCH 35.3 (*)    Platelets 552 (*)    Neutro Abs 16.9 (*)    Monocytes Absolute 1.1 (*)    Abs Immature Granulocytes 0.15 (*)    All other components within normal limits  PROTIME-INR - Abnormal; Notable for the following components:   Prothrombin Time 18.6 (*)    All other components within normal limits  GLUCOSE, CAPILLARY - Abnormal; Notable for the following components:   Glucose-Capillary 20 (*)    All other components within normal limits  GLUCOSE, CAPILLARY - Abnormal; Notable for the following components:   Glucose-Capillary 44 (*)    All other components within normal limits  ETHANOL  SALICYLATE LEVEL  AMMONIA  GLUCOSE, CAPILLARY  LACTIC ACID, PLASMA  GLUCOSE, CAPILLARY  URINALYSIS, COMPLETE (UACMP) WITH MICROSCOPIC  URINE DRUG SCREEN, QUALITATIVE ([facility] ONLY)   ____________________________________________  EKG  ED ECG REPORT I, Arta Silence, the attending physician, personally viewed and interpreted this ECG.  Date: 05/03/2018 EKG Time: 1951 Rate: 121 Rhythm: Sinus tachycardia with PVCs QRS Axis: normal Intervals:  normal ST/T Wave abnormalities: normal Narrative Interpretation: no evidence of acute ischemia  ____________________________________________  RADIOLOGY  CXR: No focal infiltrate  ____________________________________________   PROCEDURES  Procedure(s) performed: No  Procedures  Critical Care performed: Yes  CRITICAL CARE Performed by: Arta Silence   Total critical care time: 45 minutes  Critical care time was exclusive of separately billable procedures and treating other patients.  Critical care was necessary to treat or prevent imminent or life-threatening deterioration.  Critical care was time spent personally by me on the following activities: development of treatment plan with patient and/or surrogate as well as nursing, discussions with consultants, evaluation of patient's response to treatment, examination of patient, obtaining history from patient or surrogate, ordering and performing treatments and interventions, ordering and review of laboratory studies, ordering and review of radiographic studies, pulse oximetry and re-evaluation of patient's condition.  ____________________________________________   INITIAL IMPRESSION / ASSESSMENT AND PLAN / ED COURSE  Pertinent labs & imaging results that were available during my care of the patient were reviewed by me and considered in my medical decision making (see chart for details).  77 year old male with PMH as noted above presents with acute onset of altered mental status this evening.  Per EMS, the patient was found unresponsive by family.  He was noted to be significantly hypoglycemic.  During transport the patient became more alert and agitated.  On arrival to the ED the patient is uncooperative, repeatedly asking to get out of the bed and get "fresh air" despite attempting to orient him and explain what is going on.  On exam otherwise, his vital signs are normal except for slight tachycardia.  He appears  jaundiced.  He has mild tenderness in the right side of the abdomen.  His neuro exam is nonfocal and there is no evidence of trauma.  He is oriented to person, being in the hospital, and the year.  I reviewed the past medical records in epic; the patient was admitted earlier this month and diagnosed with metastatic pancreatic cancer.  He has not yet begun any chemotherapy.  The differential is broad but includes AMS due to hypoglycemia, acute infection/sepsis, hepatic encephalopathy, or other metabolic etiology.  Due to his acute agitation and inability to be verbally redirected (as well as physically fighting staff attempting to place him on the monitor and getting an IV due to his acute confusion) he required Ativan for anxiolysis and mild sedation.  We will obtain work-up and reassess.  ----------------------------------------- 11:29 PM on 05/03/2018 -----------------------------------------  The patient has had recurrent hypoglycemia despite being given the D50.  I have now placed him on a D5 1/2NS drip.  His lab work-up reveals multiple abnormalities but these are mostly consistent with his labs over the past 2 weeks.  He has an elevated WBC count, anemia, elevated LFTs, and hyponatremia.  I was able to obtain further information from the family.  The patient is on Lantus but no short acting insulin.  He did take his Lantus today.  They are not sure if he ate less than usual or if he is more sensitive to Lantus now because of his acute medical issues.  Overall I suspect that he is likely hypoglycemic because of the Lantus.  Given his recurrent hypoglycemia that is somewhat refractory to our treatment, as well as the long-acting nature of the medication, he will require admission.  I signed the patient out to the hospitalist Dr. Duane Boston.  ____________________________________________   FINAL CLINICAL IMPRESSION(S) / ED DIAGNOSES  Final diagnoses:  Hypoglycemia  Altered mental status,  unspecified altered mental status type      NEW MEDICATIONS STARTED DURING THIS VISIT:  New Prescriptions   No medications on file     Note:  This document was prepared using Dragon voice recognition software and may include unintentional dictation errors.        Arta Silence, MD 05/03/18 2333

## 2018-05-03 NOTE — ED Notes (Signed)
XR at bedside

## 2018-05-03 NOTE — ED Notes (Signed)
Date and time results received: 05/03/18 2020 (use smartphrase ".now" to insert current time)  Test: Lactic 2.4, Trop 0.04, glucose consistent with previous  Name of Provider Notified: Siadecki MD

## 2018-05-03 NOTE — Progress Notes (Signed)
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  During his recent appointment with primary care doctor he was found to have jaundice.  On admission patient was noted to have a total bilirubin of 16 along with alkaline phosphatase of 1507 and elevated AST and ALT respectively.  CBC showed white count of 12.4 and H&H of 9.1/25 with an MCV of 103 and a platelet count of 362.  CT abdomen showed 2.2 x 2.9 cm mass in the pancreatic head concerning for pancreatic neoplasm.  Double duct sign with intra-and extrahepatic ductal dilatation and dilatation of the pancreatic duct.  Peripancreatic fluid collection versus necrotic lesions including a dominant 4.1 x 3.8 cm due to the pancreatic head with mass-effect in the distal bile duct.  Moderate ascites.  CT chest showed solitary 1.2 cm right upper lobe nodule-solitary metastases versus primary lung cancer.  Gastrohepatic ligament adenopathy CA-19-9 was elevated at 1988  Dr. Allen Norris performed ERCP with stent placement and biopies which showed adenocarcinoma which appears to be invading the duodenum and clinically from the pancreas.  Patient is here with his wife daughter and son-in-law.  He reports feeling fatigued and has had more than 30 pounds of weight loss over the last 3 months.  Reports that his appetite is poor and he has early satiety.  He continues to smoke about half pack of cigarettes per day and has  been doing so for several years.  Reports that his jaundice is getting better since hospital discharge.  Denies any pruritus.  He has left-sided abdominal pain for which she has been using Advil and Tylenol but that has not been helping him.  Bowel movements are regular   ECOG PS- 2  Pain scale- 6   Review of systems- Review of Systems  Constitutional: Positive for malaise/fatigue and weight loss. Negative for chills and fever.       Lack of appetite  HENT: Negative for congestion, ear discharge and nosebleeds.   Eyes: Negative for blurred vision.  Respiratory: Negative for cough, hemoptysis, sputum production, shortness of breath and wheezing.   Cardiovascular: Negative for chest pain, palpitations, orthopnea and claudication.  Gastrointestinal: Positive for abdominal pain. Negative for blood in stool, constipation, diarrhea, heartburn, melena, nausea and vomiting.  Genitourinary: Negative for dysuria, flank pain, frequency, hematuria and urgency.  Musculoskeletal: Negative for back pain, joint pain and myalgias.  Skin: Negative for rash.  Neurological: Negative for dizziness, tingling, focal weakness, seizures, weakness and headaches.  Endo/Heme/Allergies: Does not bruise/bleed easily.  Psychiatric/Behavioral: Negative for depression and suicidal ideas. The patient does not have insomnia.     No Known Allergies  Patient Active Problem List   Diagnosis Date Noted  . Pancreatic mass 04/26/2018  . Protein-calorie malnutrition, severe 04/26/2018  . Neoplasm of digestive system   . Obstruction of bile duct   . Other specified diseases of biliary tract   . Mild protein-calorie malnutrition ([place]) 01/08/2018  . Pernicious anemia 10/07/2014  . COPD (chronic obstructive pulmonary disease) (  [place]) 12/27/2013  . HTN (hypertension), benign 12/27/2013  . Hyperlipidemia associated with type 2 diabetes mellitus ([place]) 12/27/2013  . Type 2 diabetes mellitus with stage 1 chronic kidney disease,  without long-term current use of insulin ([place]) 12/27/2013     Past Medical History:  Diagnosis Date  . COPD (chronic obstructive pulmonary disease) ([place])   . Diabetes mellitus without complication ([place])   . Hypertension   . Shortness of breath dyspnea      Past Surgical History:  Procedure Laterality Date  . COLON SURGERY     "twisted intestine"  . ERCP N/A 04/26/2018   Procedure: ENDOSCOPIC RETROGRADE CHOLANGIOPANCREATOGRAPHY (ERCP);  Surgeon: Lucilla Lame, MD;  Location: [facility] ENDOSCOPY;  Service: Endoscopy;  Laterality: N/A;  . HERNIA MESH REMOVAL Right   . INGUINAL HERNIA REPAIR Left 12/04/2014   Procedure: HERNIA REPAIR INGUINAL ADULT;  Surgeon: Rochel Brome, MD;  Location: [facility] ORS;  Service: General;  Laterality: Left;    Social History   Socioeconomic History  . Marital status: Married    Spouse name: Not on file  . Number of children: Not on file  . Years of education: Not on file  . Highest education level: Not on file  Occupational History  . Not on file  Social Needs  . Financial resource strain: Not on file  . Food insecurity:    Worry: Not on file    Inability: Not on file  . Transportation needs:    Medical: Not on file    Non-medical: Not on file  Tobacco Use  . Smoking status: Current Every Day Smoker    Packs/day: 1.00    Years: 30.00    Pack years: 30.00  . Smokeless tobacco: Former Network engineer and Sexual Activity  . Alcohol use: No  . Drug use: No  . Sexual activity: Never  Lifestyle  . Physical activity:    Days per week: Not on file    Minutes per session: Not on file  . Stress: Not on file  Relationships  . Social connections:    Talks on phone: Not on file    Gets together: Not on file    Attends religious service: Not on file    Active member of club or organization: Not on file    Attends meetings of clubs or organizations: Not on file    Relationship status: Not on file  . Intimate partner violence:    Fear of current or ex  partner: Not on file    Emotionally abused: Not on file    Physically abused: Not on file    Forced sexual activity: Not on file  Other Topics Concern  . Not on file  Social History Narrative  . Not on file     Family History  Problem Relation Age of Onset  . Colon cancer Mother   . Melanoma Father      Current Outpatient Medications:  .  aspirin EC 81 MG tablet, Take 81 mg by mouth every other day. , Disp: , Rfl:  .  Fluticasone-Salmeterol (ADVAIR) 250-50 MCG/DOSE AEPB, Inhale 1 puff into the lungs 2 (two) times daily. , Disp: , Rfl:  .  LANTUS SOLOSTAR 100 UNIT/ML Solostar Pen, Inject 22 Units into the skin daily. , Disp: , Rfl: 12 .  magnesium chloride (SLOW-MAG) 64 MG TBEC SR tablet, Take 1 tablet by mouth daily., Disp: , Rfl:  .  metFORMIN (GLUCOPHAGE-XR) 500 MG 24 hr tablet, Take 1,000 mg by mouth 2 (two)  times daily. , Disp: , Rfl: 1 .  traZODone (DESYREL) 50 MG tablet, , Disp: , Rfl: 11 .  vitamin B-12 (CYANOCOBALAMIN) 1000 MCG tablet, Take 1,000 mcg by mouth daily., Disp: , Rfl:  .  oxyCODONE (OXY IR/ROXICODONE) 5 MG immediate release tablet, Take 1 tablet (5 mg total) by mouth every 8 (eight) hours as needed for severe pain., Disp: 90 tablet, Rfl: 0   Physical exam:  Vitals:   05/02/18 0947  BP: (!) 160/72  Pulse: (!) 125  Resp: 18  Temp: 97.8 F (36.6 C)  TempSrc: Tympanic  Weight: 142 lb 7 oz (64.6 kg)   Physical Exam  Constitutional: He is oriented to person, place, and time.  Patient appears thin and fatigued.  Diffuse yellowing of his skin.  HENT:  Head: Normocephalic and atraumatic.  icteric sclera  Eyes: Pupils are equal, round, and reactive to light. EOM are normal.  Neck: Normal range of motion.  Cardiovascular: Normal rate, regular rhythm and normal heart sounds.  Pulmonary/Chest: Effort normal and breath sounds normal.  Abdominal: Soft. Bowel sounds are normal.  Musculoskeletal: He exhibits edema.  Neurological: He is alert and oriented to  person, place, and time.  Skin: Skin is warm and dry.       CMP Latest Ref Rng & Units 05/02/2018  Glucose 70 - 99 mg/dL 84  BUN 8 - 23 mg/dL 10  Creatinine 0.61 - 1.24 mg/dL 0.42(L)  Sodium 135 - 145 mmol/L 132(L)  Potassium 3.5 - 5.1 mmol/L 3.2(L)  Chloride 98 - 111 mmol/L 90(L)  CO2 22 - 32 mmol/L 30  Calcium 8.9 - 10.3 mg/dL 8.6(L)  Total Protein 6.5 - 8.1 g/dL 6.2(L)  Total Bilirubin 0.3 - 1.2 mg/dL 6.2(H)  Alkaline Phos 38 - 126 U/L 814(H)  AST 15 - 41 U/L 58(H)  ALT 0 - 44 U/L 67(H)   CBC Latest Ref Rng & Units 05/02/2018  WBC 4.0 - 10.5 K/uL 19.1(H)  Hemoglobin 13.0 - 17.0 g/dL 8.6(L)  Hematocrit 39.0 - 52.0 % 25.6(L)  Platelets 150 - 400 K/uL 490(H)    No images are attached to the encounter.  Ct Chest W Contrast  Result Date: 04/26/2018 CLINICAL DATA:  Inpatient. Pancreatic head mass with biliary and pancreatic duct dilation, jaundice, abdominal pain. Chest staging. EXAM: CT CHEST WITH CONTRAST TECHNIQUE: Multidetector CT imaging of the chest was performed during intravenous contrast administration. CONTRAST:  56mL OMNIPAQUE IOHEXOL 300 MG/ML  SOLN COMPARISON:  04/25/2018 CT abdomen/pelvis. 10/26/2012 chest radiograph. FINDINGS: Cardiovascular: Normal heart size. No significant pericardial effusion/thickening. Three-vessel coronary atherosclerosis. Atherosclerotic nonaneurysmal thoracic aorta. Dilated main pulmonary artery (3.5 cm diameter). No central pulmonary emboli. Mediastinum/Nodes: No discrete thyroid nodules. Unremarkable esophagus. No axillary adenopathy. Mildly enlarged 1.3 cm right hilar node (series 2/image 85). No left hilar adenopathy. No pathologically enlarged mediastinal nodes. Lungs/Pleura: No pneumothorax. Small dependent right pleural effusion. No left pleural effusion. Moderate to severe centrilobular emphysema with diffuse bronchial wall thickening. Solid right upper lobe 1.2 cm pulmonary nodule (series 3/image 73). No lung masses or additional  significant pulmonary nodules. Mild-to-moderate elevation of the right hemidiaphragm. Patchy somewhat bandlike consolidation and ground-glass opacity at the right lung base with associated volume loss. Upper abdomen: Small hiatal hernia. Re-demonstration prominent diffuse intrahepatic biliary ductal dilatation and pancreatic duct dilation, unchanged. Small to moderate volume upper abdominal ascites. Partial visualization of necrotic adenopathy in the gastrohepatic ligament, unchanged. Musculoskeletal: No aggressive appearing focal osseous lesions. Mild thoracic spondylosis. IMPRESSION: 1. Solitary 1.2 cm solid right upper  lobe pulmonary nodule, malignancy not excluded, equivocal for solitary metastasis versus primary bronchogenic carcinoma. 2. Mild right hilar adenopathy, cannot exclude nodal metastatic disease. 3. Small dependent right pleural effusion. 4. Mild-to-moderate elevation of the right hemidiaphragm with patchy somewhat bandlike consolidation and ground-glass opacity at the right lung base with associated volume loss, favor atelectasis/scarring, cannot exclude a component of pneumonia. 5. Redemonstration of ascites, necrotic gastrohepatic ligament adenopathy and biliary and pancreatic dilatation in the upper abdomen, unchanged. 6. Dilated main pulmonary artery, suggesting pulmonary arterial hypertension. 7. Three-vessel coronary atherosclerosis. 8. Small hiatal hernia. Aortic Atherosclerosis (ICD10-I70.0) and Emphysema (ICD10-J43.9). Electronically Signed   By: Ilona Sorrel M.D.   On: 04/26/2018 11:19   Ct Abdomen Pelvis W Contrast  Result Date: 04/25/2018 CLINICAL DATA:  Acute abdominal pain, hypoglycemia EXAM: CT ABDOMEN AND PELVIS WITH CONTRAST TECHNIQUE: Multidetector CT imaging of the abdomen and pelvis was performed using the standard protocol following bolus administration of intravenous contrast. CONTRAST:  122mL ISOVUE-300 IOPAMIDOL (ISOVUE-300) INJECTION 61% COMPARISON:  10/21/2012 FINDINGS:  Lower chest: Mild right lower lobe atelectasis. Hepatobiliary: Liver is within normal limits. Moderate intrahepatic and extrahepatic ductal dilatation. Common duct measures up to 2.4 cm (coronal image 40) with extrinsic compression by a dominant cystic/necrotic lesion in the porta hepatitis. Gallbladder is unremarkable. No intrahepatic or extrahepatic ductal dilatation. Pancreas: 2.2 x 2.9 cm hypoenhancing lesion in the pancreatic head (series 2/image 36), highly suspicious for pancreatic neoplasm. Severe dilatation of the main pancreatic duct in the body/tail (series 2/image 27). Associated pancreatic atrophy. Peripancreatic fluid collections with enhancement along the anterior aspect of the pancreatic body/tail (series 2/image 27). Additional 4.1 x 3.8 cm cystic/necrotic lesion in the region of the pancreatic head, with mass effect on the common duct. Spleen: Within normal limits. Adrenals/Urinary Tract: Bilateral adrenal nodules, measuring up to 2.7 cm on the left,similar to 2014 and likely reflecting benign adrenal adenomas. Kidneys are within normal limits.  No hydronephrosis. Bladder is grossly unremarkable. Stomach/Bowel: Stomach is notable for a tiny hiatal hernia. Visualized bowel is grossly unremarkable. Appendix is not discretely visualized. Vascular/Lymphatic: No evidence of abdominal aortic aneurysm. No vascular involvement. Atherosclerotic calcifications of the abdominal aorta and branch vessels. No suspicious abdominopelvic lymphadenopathy. Reproductive: Prostate is grossly unremarkable. Other: Moderate abdominopelvic ascites. Postsurgical changes related to prior right ventral hernia mesh repair. Musculoskeletal: Grade 1 anterolisthesis at L5-S1. IMPRESSION: 2.2 x 2.9 cm hypoenhancing lesion in the pancreatic head, highly suspicious for pancreatic neoplasm. Associated double duct sign with intrahepatic/extrahepatic ductal dilatation and dilatation of the pancreatic duct. Associated atrophy of the  pancreatic body/tail. Associated peripancreatic fluid collections versus necrotic lesions, including a dominant 4.1 x 3.8 cm cystic lesion superior to the pancreatic head, with mass effect on the distal common duct. Moderate abdominopelvic ascites. Additional ancillary findings as above. Electronically Signed   By: Julian Hy M.D.   On: 04/25/2018 23:01   US Abdomen Limited  Result Date: 04/26/2018 CLINICAL DATA:  Pancreatic mass.  Moderate ascites described on CT EXAM: LIMITED ABDOMEN ULTRASOUND FOR ASCITES TECHNIQUE: Limited ultrasound survey for ascites was performed in all four abdominal quadrants. COMPARISON:  CT 04/25/2018 FINDINGS: There is a small amount of scattered abdominal ascites. No large pocket for safe paracentesis. IMPRESSION: Small amount of scattered abdominal ascites.  Paracentesis deferred. Electronically Signed   By: Lucrezia Europe M.D.   On: 04/26/2018 16:14   US Venous Img Lower Bilateral  Result Date: 04/26/2018 CLINICAL DATA:  Bilateral lower extremity swelling EXAM: BILATERAL LOWER EXTREMITY VENOUS DOPPLER ULTRASOUND TECHNIQUE: Gray-scale  sonography with graded compression, as well as color Doppler and duplex ultrasound were performed to evaluate the lower extremity deep venous systems from the level of the common femoral vein and including the common femoral, femoral, profunda femoral, popliteal and calf veins including the posterior tibial, peroneal and gastrocnemius veins when visible. The superficial great saphenous vein was also interrogated. Spectral Doppler was utilized to evaluate flow at rest and with distal augmentation maneuvers in the common femoral, femoral and popliteal veins. COMPARISON:  None. FINDINGS: RIGHT LOWER EXTREMITY Common Femoral Vein: No evidence of thrombus. Normal compressibility, respiratory phasicity and response to augmentation. Saphenofemoral Junction: No evidence of thrombus. Normal compressibility and flow on color Doppler imaging. Profunda  Femoral Vein: No evidence of thrombus. Normal compressibility and flow on color Doppler imaging. Femoral Vein: No evidence of thrombus. Normal compressibility, respiratory phasicity and response to augmentation. Popliteal Vein: No evidence of thrombus. Normal compressibility, respiratory phasicity and response to augmentation. Calf Veins: No evidence of thrombus. Normal compressibility and flow on color Doppler imaging. Superficial Great Saphenous Vein: No evidence of thrombus. Normal compressibility. Venous Reflux:  None. Other Findings:  None. LEFT LOWER EXTREMITY Common Femoral Vein: No evidence of thrombus. Normal compressibility, respiratory phasicity and response to augmentation. Saphenofemoral Junction: No evidence of thrombus. Normal compressibility and flow on color Doppler imaging. Profunda Femoral Vein: No evidence of thrombus. Normal compressibility and flow on color Doppler imaging. Femoral Vein: No evidence of thrombus. Normal compressibility, respiratory phasicity and response to augmentation. Popliteal Vein: No evidence of thrombus. Normal compressibility, respiratory phasicity and response to augmentation. Calf Veins: No evidence of thrombus. Normal compressibility and flow on color Doppler imaging. Superficial Great Saphenous Vein: No evidence of thrombus. Normal compressibility. Venous Reflux:  None. Other Findings: Bilateral lower extremity subcutaneous edema noted. Bilateral peripheral atherosclerosis evident. IMPRESSION: No evidence of deep venous thrombosis. Electronically Signed   By: Jerilynn Mages.  Shick M.D.   On: 04/26/2018 15:05   Dg C-arm 1-60 Min-no Report  Result Date: 04/26/2018 Fluoroscopy was utilized by the requesting physician.  No radiographic interpretation.    Assessment and plan- Patient is a 77 y.o. male with newly diagnosed stage IV pancreatic adenocarcinoma T2 N1 M1 with metastases to lymph nodes and probably the lung  I have reviewed CT chest abdomen and pelvis images  independently.  I discussed findings with patient and family.  Patient has an obstructive mass in the head of the pancreas which is locally invading the duodenum.  He has evidence of gastrohepatic adenopathy which would not be regional lymph nodes and constitutes metastatic disease as well.  Patient noted to have a right upper lobe lung nodule as well as hilar adenopathy and it is unclear if it represents a separate primary or metastases from pancreatic primary.  He does have an extensive history of smoking.  However at this time I will hold off on lung biopsy as his overall prognosis from underlying stage IV pancreatic cancer is poor.  Patient has undergone ERCP with biopsies and stent placement and his obstructive jaundice has improved.  His total bilirubin has come down from 16-6 but has not normalized yet.  I will wait for another week to see if his bilirubin can improve further before starting systemic chemotherapy.  Discussed that chemotherapy will be palliative and not curative and would add a few months but not years to his longevity.  Chemotherapy will be associated with possible side effects including all but not limited to nausea vomiting fatigue low blood counts and risk of  hospitalization and infection.  I am leaning towards offering him to gemcitabine plus minus Abraxane based on his liver functions next week.  If counts permit chemotherapy he would need port placement and chemotherapy teach class which we will arrange next week.  Neoplasm related pain: Patient reports significant abdominal pain that is not controlled with Tylenol and Advil.  I have asked him to hold his Tylenol and Advil at this time.  I will prescribe him oxycodone 5 mg q. 8 as needed for pain.  I have asked him to take it only as prescribed and not more frequently given his underlying abnormal liver function test.  I have advised him to take MiraLAX along with senna for opioid associated constipation.  I will discuss  genetic testing if we decide to pursue treatment at next visit.  I will also refer him to see NP Altha Harm next week when I see him to establish outpatient palliative care and for goals of care discussion.  I will refer him to see our dietitian as well to address his nutritional needs.  Patient is a diabetic and is currently taking 1-2 bottles of Glucerna for nutritional supplementation.   Total face to face encounter time for this patient visit was 45 min. >50% of the time was  spent in counseling and coordination of care.       Thank you for this kind referral and the opportunity to participate in the care of this  Patient   Visit Diagnosis 1. Malignant neoplasm of pancreas, unspecified location of malignancy ([place])   2. Obstructive jaundice   3. Protein-calorie malnutrition, severe   4. Goals of care, counseling/discussion   5. Neoplasm related pain     Dr. Randa Evens, MD, MPH [facility] at [facility] [phone] 05/03/2018 3:55 PM

## 2018-05-04 ENCOUNTER — Other Ambulatory Visit: Payer: Self-pay

## 2018-05-04 ENCOUNTER — Observation Stay: Payer: Medicare Other

## 2018-05-04 DIAGNOSIS — R4182 Altered mental status, unspecified: Secondary | ICD-10-CM | POA: Diagnosis not present

## 2018-05-04 DIAGNOSIS — E11649 Type 2 diabetes mellitus with hypoglycemia without coma: Secondary | ICD-10-CM | POA: Diagnosis not present

## 2018-05-04 LAB — CBC
HCT: 20.3 % — ABNORMAL LOW (ref 39.0–52.0)
Hemoglobin: 6.9 g/dL — ABNORMAL LOW (ref 13.0–17.0)
MCH: 35.8 pg — ABNORMAL HIGH (ref 26.0–34.0)
MCHC: 34 g/dL (ref 30.0–36.0)
MCV: 105.2 fL — ABNORMAL HIGH (ref 80.0–100.0)
NRBC: 0 % (ref 0.0–0.2)
PLATELETS: 413 10*3/uL — AB (ref 150–400)
RBC: 1.93 MIL/uL — ABNORMAL LOW (ref 4.22–5.81)
RDW: 14.5 % (ref 11.5–15.5)
WBC: 17.2 10*3/uL — AB (ref 4.0–10.5)

## 2018-05-04 LAB — URINE DRUG SCREEN, QUALITATIVE (ARMC ONLY)
AMPHETAMINES, UR SCREEN: NOT DETECTED
BARBITURATES, UR SCREEN: NOT DETECTED
BENZODIAZEPINE, UR SCRN: POSITIVE — AB
COCAINE METABOLITE, UR ~~LOC~~: NOT DETECTED
Cannabinoid 50 Ng, Ur ~~LOC~~: NOT DETECTED
MDMA (Ecstasy)Ur Screen: NOT DETECTED
METHADONE SCREEN, URINE: NOT DETECTED
OPIATE, UR SCREEN: NOT DETECTED
Phencyclidine (PCP) Ur S: NOT DETECTED
Tricyclic, Ur Screen: NOT DETECTED

## 2018-05-04 LAB — MAGNESIUM: MAGNESIUM: 1.6 mg/dL — AB (ref 1.7–2.4)

## 2018-05-04 LAB — BASIC METABOLIC PANEL
Anion gap: 10 (ref 5–15)
BUN: 14 mg/dL (ref 8–23)
CALCIUM: 7.5 mg/dL — AB (ref 8.9–10.3)
CHLORIDE: 90 mmol/L — AB (ref 98–111)
CO2: 28 mmol/L (ref 22–32)
Glucose, Bld: 130 mg/dL — ABNORMAL HIGH (ref 70–99)
Potassium: 3 mmol/L — ABNORMAL LOW (ref 3.5–5.1)
SODIUM: 128 mmol/L — AB (ref 135–145)

## 2018-05-04 LAB — URINALYSIS, COMPLETE (UACMP) WITH MICROSCOPIC
BILIRUBIN URINE: NEGATIVE
Glucose, UA: 50 mg/dL — AB
Hgb urine dipstick: NEGATIVE
Ketones, ur: NEGATIVE mg/dL
Leukocytes, UA: NEGATIVE
Nitrite: NEGATIVE
PH: 5 (ref 5.0–8.0)
Protein, ur: NEGATIVE mg/dL
SPECIFIC GRAVITY, URINE: 1.016 (ref 1.005–1.030)
SQUAMOUS EPITHELIAL / LPF: NONE SEEN (ref 0–5)

## 2018-05-04 LAB — GLUCOSE, CAPILLARY
GLUCOSE-CAPILLARY: 53 mg/dL — AB (ref 70–99)
GLUCOSE-CAPILLARY: 85 mg/dL (ref 70–99)
GLUCOSE-CAPILLARY: 222 mg/dL — AB (ref 70–99)
GLUCOSE-CAPILLARY: 269 mg/dL — AB (ref 70–99)
GLUCOSE-CAPILLARY: 295 mg/dL — AB (ref 70–99)
Glucose-Capillary: 140 mg/dL — ABNORMAL HIGH (ref 70–99)

## 2018-05-04 LAB — ABO/RH: ABO/RH(D): O POS

## 2018-05-04 LAB — FERRITIN: Ferritin: 289 ng/mL (ref 24–336)

## 2018-05-04 LAB — HEMOGLOBIN AND HEMATOCRIT, BLOOD
HEMATOCRIT: 24.9 % — AB (ref 39.0–52.0)
HEMOGLOBIN: 8.3 g/dL — AB (ref 13.0–17.0)

## 2018-05-04 LAB — PREPARE RBC (CROSSMATCH)

## 2018-05-04 MED ORDER — TRAZODONE HCL 50 MG PO TABS
50.0000 mg | ORAL_TABLET | Freq: Every day | ORAL | Status: DC
  Administered 2018-05-04 (×2): 50 mg via ORAL
  Filled 2018-05-04 (×2): qty 1

## 2018-05-04 MED ORDER — ASPIRIN EC 81 MG PO TBEC
81.0000 mg | DELAYED_RELEASE_TABLET | ORAL | Status: DC
  Administered 2018-05-05: 81 mg via ORAL
  Filled 2018-05-04: qty 1

## 2018-05-04 MED ORDER — HEPARIN SODIUM (PORCINE) 5000 UNIT/ML IJ SOLN
5000.0000 [IU] | Freq: Three times a day (TID) | INTRAMUSCULAR | Status: DC
  Administered 2018-05-04 (×2): 5000 [IU] via SUBCUTANEOUS
  Filled 2018-05-04 (×2): qty 1

## 2018-05-04 MED ORDER — MAGNESIUM CHLORIDE 64 MG PO TBEC
64.0000 mg | DELAYED_RELEASE_TABLET | Freq: Every day | ORAL | Status: DC
  Administered 2018-05-04 – 2018-05-05 (×2): 64 mg via ORAL
  Filled 2018-05-04 (×2): qty 1

## 2018-05-04 MED ORDER — POTASSIUM CHLORIDE CRYS ER 20 MEQ PO TBCR
40.0000 meq | EXTENDED_RELEASE_TABLET | Freq: Once | ORAL | Status: AC
  Administered 2018-05-04: 40 meq via ORAL
  Filled 2018-05-04: qty 2

## 2018-05-04 MED ORDER — BISACODYL 5 MG PO TBEC: 5.0000 mg | DELAYED_RELEASE_TABLET | Freq: Every day | ORAL | Status: DC | PRN

## 2018-05-04 MED ORDER — TRAZODONE HCL 50 MG PO TABS: 25.0000 mg | ORAL_TABLET | Freq: Every evening | ORAL | Status: DC | PRN

## 2018-05-04 MED ORDER — KCL IN DEXTROSE-NACL 20-5-0.9 MEQ/L-%-% IV SOLN
INTRAVENOUS | Status: DC
  Administered 2018-05-04: 07:00:00 via INTRAVENOUS
  Filled 2018-05-04 (×3): qty 1000

## 2018-05-04 MED ORDER — MAGNESIUM SULFATE 2 GM/50ML IV SOLN
2.0000 g | Freq: Once | INTRAVENOUS | Status: AC
  Administered 2018-05-04: 18:00:00 2 g via INTRAVENOUS
  Filled 2018-05-04: qty 50

## 2018-05-04 MED ORDER — ACETAMINOPHEN 650 MG RE SUPP: 650.0000 mg | Freq: Four times a day (QID) | RECTAL | Status: DC | PRN

## 2018-05-04 MED ORDER — ONDANSETRON HCL 4 MG PO TABS: 4.0000 mg | ORAL_TABLET | Freq: Four times a day (QID) | ORAL | Status: DC | PRN

## 2018-05-04 MED ORDER — LEVOFLOXACIN IN D5W 500 MG/100ML IV SOLN
500.0000 mg | INTRAVENOUS | Status: DC
  Administered 2018-05-04: 500 mg via INTRAVENOUS
  Filled 2018-05-04 (×2): qty 100

## 2018-05-04 MED ORDER — HYDROCODONE-ACETAMINOPHEN 5-325 MG PO TABS: 1.0000 | ORAL_TABLET | ORAL | Status: DC | PRN

## 2018-05-04 MED ORDER — INSULIN ASPART 100 UNIT/ML ~~LOC~~ SOLN
0.0000 [IU] | Freq: Three times a day (TID) | SUBCUTANEOUS | Status: DC
  Administered 2018-05-04: 12:00:00 3 [IU] via SUBCUTANEOUS
  Administered 2018-05-04: 1 [IU] via SUBCUTANEOUS
  Filled 2018-05-04 (×2): qty 1

## 2018-05-04 MED ORDER — FLUTICASONE FUROATE-VILANTEROL 200-25 MCG/INH IN AEPB
1.0000 | INHALATION_SPRAY | Freq: Every day | RESPIRATORY_TRACT | Status: DC
  Administered 2018-05-04 – 2018-05-05 (×2): 1 via RESPIRATORY_TRACT
  Filled 2018-05-04: qty 28

## 2018-05-04 MED ORDER — INSULIN ASPART 100 UNIT/ML ~~LOC~~ SOLN
0.0000 [IU] | Freq: Every day | SUBCUTANEOUS | Status: DC
  Administered 2018-05-04: 23:00:00 3 [IU] via SUBCUTANEOUS
  Filled 2018-05-04: qty 1

## 2018-05-04 MED ORDER — DOCUSATE SODIUM 100 MG PO CAPS
100.0000 mg | ORAL_CAPSULE | Freq: Two times a day (BID) | ORAL | Status: DC
  Administered 2018-05-04 – 2018-05-05 (×4): 100 mg via ORAL
  Filled 2018-05-04 (×4): qty 1

## 2018-05-04 MED ORDER — ACETAMINOPHEN 325 MG PO TABS
650.0000 mg | ORAL_TABLET | Freq: Four times a day (QID) | ORAL | Status: DC | PRN
  Administered 2018-05-04: 650 mg via ORAL
  Filled 2018-05-04: qty 2

## 2018-05-04 MED ORDER — SODIUM CHLORIDE 0.9% IV SOLUTION
Freq: Once | INTRAVENOUS | Status: AC
  Administered 2018-05-04: 09:00:00 via INTRAVENOUS

## 2018-05-04 MED ORDER — VITAMIN B-12 1000 MCG PO TABS
1000.0000 ug | ORAL_TABLET | Freq: Every day | ORAL | Status: DC
  Administered 2018-05-04 – 2018-05-05 (×2): 1000 ug via ORAL
  Filled 2018-05-04 (×2): qty 1

## 2018-05-04 MED ORDER — INSULIN ASPART 100 UNIT/ML ~~LOC~~ SOLN
0.0000 [IU] | Freq: Three times a day (TID) | SUBCUTANEOUS | Status: DC
  Administered 2018-05-04 – 2018-05-05 (×2): 5 [IU] via SUBCUTANEOUS
  Filled 2018-05-04 (×2): qty 1

## 2018-05-04 MED ORDER — ONDANSETRON HCL 4 MG/2ML IJ SOLN: 4.0000 mg | Freq: Four times a day (QID) | INTRAMUSCULAR | Status: DC | PRN

## 2018-05-04 MED ORDER — OXYCODONE HCL 5 MG PO TABS: 5.0000 mg | ORAL_TABLET | Freq: Three times a day (TID) | ORAL | Status: DC | PRN

## 2018-05-04 NOTE — Progress Notes (Signed)
Patient ID: Bryan Hicks, male   DOB: [birth year], 77 y.o.   MRN: [id number]  [facility] PROGRESS NOTE  MARVELL TAMER YKD:[id number] DOB: 1940/10/28 DOA: 05/03/2018 PCP: Sindy Guadeloupe, MD  HPI/Subjective: Patient feeling a little bit better today.  States he felt sick yesterday but does not give me any details on how he felt.  Recently diagnosed with pancreatic cancer.  Patient denies pain but takes pain medication at home.  Denies bleeding from any source.  Objective: Vitals:   05/04/18 1021 05/04/18 1055  BP: 120/75 115/63  Pulse: 90 93  Resp: 16 16  Temp: 99.9 F (37.7 C) 99.8 F (37.7 C)  SpO2: 95% 94%    Filed Weights   05/03/18 1949 05/04/18 0101 05/04/18 0500  Weight: 63 kg 64.6 kg 66.4 kg    ROS: Review of Systems  Constitutional: Negative for chills and fever.  Eyes: Negative for blurred vision.  Respiratory: Negative for cough and shortness of breath.   Cardiovascular: Negative for chest pain.  Gastrointestinal: Negative for abdominal pain, constipation, diarrhea, nausea and vomiting.  Genitourinary: Negative for dysuria.  Musculoskeletal: Negative for joint pain.  Neurological: Negative for dizziness and headaches.   Exam: Physical Exam  Constitutional: He is oriented to person, place, and time.  HENT:  Nose: No mucosal edema.  Mouth/Throat: No oropharyngeal exudate or posterior oropharyngeal edema.  Eyes: Pupils are equal, round, and reactive to light. EOM and lids are normal.  Icteric  Neck: No JVD present. Carotid bruit is not present. No edema present. No thyroid mass and no thyromegaly present.  Cardiovascular: S1 normal and S2 normal. Exam reveals no gallop.  No murmur heard. Pulses:      Dorsalis pedis pulses are 2+ on the right side, and 2+ on the left side.  Respiratory: No respiratory distress. He has no wheezes. He has no rhonchi. He has no rales.  GI: Soft. Bowel sounds are normal. There is no tenderness.  Musculoskeletal:        Right ankle: He exhibits swelling.       Left ankle: He exhibits swelling.  Lymphadenopathy:    He has no cervical adenopathy.  Neurological: He is alert and oriented to person, place, and time. No cranial nerve deficit.  Skin: Skin is warm. Nails show no clubbing.  Jaundiced  Psychiatric: He has a normal mood and affect.      Data Reviewed: Basic Metabolic Panel: Recent Labs  Lab 05/02/18 0845 05/03/18 1944 05/04/18 0340  NA 132* 129* 128*  K 3.2* 3.1* 3.0*  CL 90* 89* 90*  CO2 30 29 28   GLUCOSE 84 36* 130*  BUN 10 16 14   CREATININE 0.42* 0.57* <0.30*  CALCIUM 8.6* 8.2* 7.5*  MG  --   --  1.6*   Liver Function Tests: Recent Labs  Lab 05/02/18 0845 05/03/18 1944  AST 58* 54*  ALT 67* 62*  ALKPHOS 814* 835*  BILITOT 6.2* 5.7*  PROT 6.2* 6.5  ALBUMIN 2.4* 2.5*   Recent Labs  Lab 05/03/18 1944  LIPASE 62*   Recent Labs  Lab 05/03/18 1959  AMMONIA 24   CBC: Recent Labs  Lab 05/02/18 0845 05/03/18 1944 05/04/18 0340  WBC 19.1* 19.0* 17.2*  NEUTROABS 16.8* 16.9*  --   HGB 8.6* 8.9* 6.9*  HCT 25.6* 26.5* 20.3*  MCV 105.3* 105.2* 105.2*  PLT 490* 552* 413*   Cardiac Enzymes: Recent Labs  Lab 05/03/18 1944  TROPONINI 0.04*    CBG: Recent Labs  Lab 05/03/18 2349 05/04/18 0128 05/04/18 0201 05/04/18 0811 05/04/18 1151  GLUCAP 92 53* 85 140* 222*    Recent Results (from the past 240 hour(s))  CULTURE, BLOOD (ROUTINE X 2) w Reflex to ID Panel     Status: None   Collection Time: 04/26/18  8:12 AM  Result Value Ref Range Status   Specimen Description BLOOD BLOOD LEFT HAND  Final   Special Requests   Final    BOTTLES DRAWN AEROBIC AND ANAEROBIC Blood Culture adequate volume   Culture   Final    NO GROWTH 5 DAYS Performed at [facility], [address] [place], [place] [address]    Report Status 05/01/2018 FINAL  Final  CULTURE, BLOOD (ROUTINE X 2) w Reflex to ID Panel     Status: None   Collection Time: 04/26/18  8:12 AM   Result Value Ref Range Status   Specimen Description BLOOD BLOOD RIGHT HAND  Final   Special Requests   Final    BOTTLES DRAWN AEROBIC AND ANAEROBIC Blood Culture adequate volume   Culture   Final    NO GROWTH 5 DAYS Performed at [facility], [address] [place], [place] [address]    Report Status 05/01/2018 FINAL  Final     Studies: Dg Chest Portable 1 View  Result Date: 05/03/2018 CLINICAL DATA:  Altered mental status.  Weakness. EXAM: PORTABLE CHEST 1 VIEW COMPARISON:  CT 04/26/2018, CXR 10/26/2012 FINDINGS: Redemonstration of a nodular density projecting over the right mid lung laterally. Mild emphysematous hyperinflation of the lungs with chronic interstitial change. No alveolar confluence, pulmonary edema, effusion or pneumothorax. Dextroconvex curvature of the mid to upper thoracic spine is redemonstrated. Heart and mediastinal contours are within normal limits with mild-to-moderate aortic atherosclerosis noted at the arch. No aneurysm is identified. No suspicious nor aggressive osseous lesions. IMPRESSION: 1. Emphysematous hyperinflation of the lungs with peripheral right midlung nodular density noted as documented on recent CT. 2. Chronic interstitial change of the lungs without alveolar confluence, edema, effusion or pneumothorax. Electronically Signed   By: Ashley Royalty M.D.   On: 05/03/2018 20:13    Scheduled Meds: . [START ON 05/05/2018] aspirin EC  81 mg Oral QODAY  . docusate sodium  100 mg Oral BID  . fluticasone furoate-vilanterol  1 puff Inhalation Daily  . heparin  5,000 Units Subcutaneous Q8H  . insulin aspart  0-5 Units Subcutaneous QHS  . insulin aspart  0-9 Units Subcutaneous TID WC  . magnesium chloride  64 mg Oral Daily  . traZODone  50 mg Oral QHS  . vitamin B-12  1,000 mcg Oral Daily   Continuous Infusions: . dextrose 5 % and 0.9 % NaCl with KCl 20 mEq/L Stopped (05/04/18 1230)  . levofloxacin (LEVAQUIN) IV      Assessment/Plan:  1. Acute  on chronic anemia.  Transfusing 1 unit of packed red blood cells today. 2. Metastatic pancreatic cancer.  Overall prognosis is poor.  Made a DNR today. 3. Type 2 diabetes with hypoglycemia.  Holding insulin and metformin on D50 drip.  Sliding scale for now.  Hopefully can get rid of IV fluids later today. 4. Acute metabolic encephalopathy improved. 5. Fever.  Empiric Levaquin.  Repeat sonogram of the lower extremities to rule out DVT. 6. COPD.  Code Status:     Code Status Orders  (From admission, onward)         Start     Ordered   05/04/18 1331  Do not  attempt resuscitation (DNR)  Continuous    Question Answer Comment  In the event of cardiac or respiratory ARREST Do not call a "code blue"   In the event of cardiac or respiratory ARREST Do not perform Intubation, CPR, defibrillation or ACLS   In the event of cardiac or respiratory ARREST Use medication by any route, position, wound care, and other measures to relive pain and suffering. May use oxygen, suction and manual treatment of airway obstruction as needed for comfort.   Comments nurse may pronounce      05/04/18 1330        Code Status History    Date Active Date Inactive Code Status Order ID Comments User Context   05/04/2018 0101 05/04/2018 1330 Full Code [id number]  Amelia Jo, MD Inpatient   04/26/2018 0336 04/27/2018 1825 Full Code [id number]  Lance Coon, MD Inpatient    Advance Directive Documentation     Most Recent Value  Type of Advance Directive  Healthcare Power of Attorney, Living will  Pre-existing out of facility DNR order (yellow form or pink MOST form)  -  "MOST" Form in Place?  -     Family Communication: Wife and daughter at the bedside Disposition Plan: To be determined  Antibiotics:  Levaquin  Time spent: 28 minutes including ACP time  [employer]

## 2018-05-04 NOTE — H&P (Signed)
[facility] NAME: Bryan Hicks    MR#:  [id number]  DATE OF BIRTH:  29-Dec-1940  DATE OF ADMISSION:  05/03/2018  PRIMARY CARE PHYSICIAN: Sindy Guadeloupe, MD   REQUESTING/REFERRING PHYSICIAN:   CHIEF COMPLAINT:   Chief Complaint  Patient presents with  . Altered Mental Status  . Hypoglycemia    HISTORY OF PRESENT ILLNESS: Bryan Hicks  is a 77 y.o. male with a known history of metastatic pancreatic cancer, diabetes type 2, hypertension and COPD. Patient is currently unable to provide history due to altered mental status.  Most of the information was taken from reviewing the medical records and from discussion with emergency room physician. He was brought to emergency room for altered mental status and hypoglycemia. Per EMS, they were called because the patient was found to be unresponsive.  On their arrival the [facility] was 24 and the patient was given D10.  On arrival to the ED the patient was alert but very confused and somewhat agitated.  Repeat FSBG was 34. Other blood test done emergency room are notable for elevated WBC at 19.1, hemoglobin level at 8.6 sodium level at 132 and potassium at 3.2.  Liver function tests are abnormal, at baseline. No acute findings per chest x-ray. Patient is admitted for further evaluation and treatment.   PAST MEDICAL HISTORY:   Past Medical History:  Diagnosis Date  . COPD (chronic obstructive pulmonary disease) ([place])   . Diabetes mellitus without complication ([place])   . Hypertension   . Shortness of breath dyspnea     PAST SURGICAL HISTORY:  Past Surgical History:  Procedure Laterality Date  . COLON SURGERY     "twisted intestine"  . ERCP N/A 04/26/2018   Procedure: ENDOSCOPIC RETROGRADE CHOLANGIOPANCREATOGRAPHY (ERCP);  Surgeon: Lucilla Lame, MD;  Location: [facility] ENDOSCOPY;  Service: Endoscopy;  Laterality: N/A;  . HERNIA MESH REMOVAL Right   . INGUINAL HERNIA REPAIR Left 12/04/2014    Procedure: HERNIA REPAIR INGUINAL ADULT;  Surgeon: Rochel Brome, MD;  Location: [facility] ORS;  Service: General;  Laterality: Left;    SOCIAL HISTORY:  Social History   Tobacco Use  . Smoking status: Current Every Day Smoker    Packs/day: 1.00    Years: 30.00    Pack years: 30.00  . Smokeless tobacco: Former Network engineer Use Topics  . Alcohol use: No    FAMILY HISTORY:  Family History  Problem Relation Age of Onset  . Colon cancer Mother   . Melanoma Father     DRUG ALLERGIES: No Known Allergies  REVIEW OF SYSTEMS:   Unable to obtain due to patient's altered mental status.  MEDICATIONS AT HOME:  Prior to Admission medications   Medication Sig Start Date End Date Taking? Authorizing Provider  aspirin EC 81 MG tablet Take 81 mg by mouth every other day.    Yes [provider]  Fluticasone-Salmeterol (ADVAIR) 250-50 MCG/DOSE AEPB Inhale 1 puff into the lungs 2 (two) times daily.    Yes [provider]  LANTUS SOLOSTAR 100 UNIT/ML Solostar Pen Inject 22 Units into the skin daily.  03/07/18  Yes [provider]  magnesium chloride (SLOW-MAG) 64 MG TBEC SR tablet Take 1 tablet by mouth daily.   Yes [provider]  metFORMIN (GLUCOPHAGE-XR) 500 MG 24 hr tablet Take 1,000 mg by mouth 2 (two) times daily.  01/22/18  Yes [provider]  oxyCODONE (OXY IR/ROXICODONE) 5 MG immediate release tablet Take  1 tablet (5 mg total) by mouth every 8 (eight) hours as needed for severe pain. 05/02/18  Yes Sindy Guadeloupe, MD  vitamin B-12 (CYANOCOBALAMIN) 1000 MCG tablet Take 1,000 mcg by mouth daily.   Yes [provider]  traZODone (DESYREL) 50 MG tablet  04/30/18   [provider]      PHYSICAL EXAMINATION:   VITAL SIGNS: Blood pressure 119/69, pulse 72, temperature 98.5 F (36.9 C), temperature source Oral, resp. rate 19, height 5\' 10"  (1.778 m), weight 63 kg, SpO2 98 %.  GENERAL:  77 y.o.-year-old patient lying in the bed.   He looks chronically ill but nontoxic, he is confused. EYES: Pupils equal, round, reactive to light and accommodation. Scleral icterus noted.  HEENT: Head atraumatic, normocephalic. Oropharynx and nasopharynx clear.  NECK:  Supple, no jugular venous distention. No thyroid enlargement, no tenderness.  LUNGS: Decreased breath sounds bilaterally, no wheezing, rales,rhonchi or crepitation. No use of accessory muscles of respiration.  CARDIOVASCULAR: S1, S2 normal. No S3/S4.  ABDOMEN: Soft, nontender, nondistended. Bowel sounds present. No organomegaly or mass.  EXTREMITIES: 2+ chronic bilateral extremities edema noted.  Otherwise, extremities are warm and well-perfused. NEUROLOGIC EXAM: Is limited, due to patient's altered mental status.  Patient is moving all his extremities, no focal weakness appreciated. PSYCHIATRIC: The patient is alert, but confused.  SKIN: No obvious rash, lesion, or ulcer.   LABORATORY PANEL:   CBC Recent Labs  Lab 04/27/18 0406 05/02/18 0845 05/03/18 1944  WBC 14.8* 19.1* 19.0*  HGB 9.8* 8.6* 8.9*  HCT 28.9* 25.6* 26.5*  PLT 434 490* 552*  MCV 106.2* 105.3* 105.2*  MCH 35.9* 35.4* 35.3*  MCHC 33.8 33.6 33.6  RDW 16.9* 15.1 14.4  LYMPHSABS 0.1* 0.9 0.7  MONOABS 0.4 1.1* 1.1*  EOSABS 0.0 0.1 0.1  BASOSABS 0.0 0.0 0.0   ------------------------------------------------------------------------------------------------------------------  Chemistries  Recent Labs  Lab 04/27/18 0406 05/02/18 0845 05/03/18 1944  NA 127* 132* 129*  K 4.4 3.2* 3.1*  CL 92* 90* 89*  CO2 26 30 29   GLUCOSE 312* 84 36*  BUN 11 10 16   CREATININE 0.49* 0.42* 0.57*  CALCIUM 8.0* 8.6* 8.2*  AST 94* 58* 54*  ALT 108* 67* 62*  ALKPHOS 1,117* 814* 835*  BILITOT 11.7* 6.2* 5.7*   ------------------------------------------------------------------------------------------------------------------ estimated creatinine clearance is 71.1 mL/min (A) (by C-G formula based on SCr of 0.57  mg/dL (L)). ------------------------------------------------------------------------------------------------------------------ No results for input(s): TSH, T4TOTAL, T3FREE, THYROIDAB in the last 72 hours.  Invalid input(s): FREET3   Coagulation profile Recent Labs  Lab 05/03/18 1944  INR 1.57   ------------------------------------------------------------------------------------------------------------------- No results for input(s): DDIMER in the last 72 hours. -------------------------------------------------------------------------------------------------------------------  Cardiac Enzymes Recent Labs  Lab 05/03/18 1944  TROPONINI 0.04*   ------------------------------------------------------------------------------------------------------------------ Invalid input(s): POCBNP  ---------------------------------------------------------------------------------------------------------------  Urinalysis    Component Value Date/Time   COLORURINE AMBER (A) 04/26/2018 0024   APPEARANCEUR CLEAR (A) 04/26/2018 0024   APPEARANCEUR Clear 10/20/2012 2042   LABSPEC 1.034 (H) 04/26/2018 0024   LABSPEC 1.023 10/20/2012 2042   PHURINE 6.0 04/26/2018 0024   GLUCOSEU NEGATIVE 04/26/2018 0024   GLUCOSEU >=500 10/20/2012 2042   HGBUR NEGATIVE 04/26/2018 0024   BILIRUBINUR MODERATE (A) 04/26/2018 0024   BILIRUBINUR Negative 10/20/2012 2042   KETONESUR NEGATIVE 04/26/2018 0024   PROTEINUR NEGATIVE 04/26/2018 0024   NITRITE NEGATIVE 04/26/2018 0024   LEUKOCYTESUR NEGATIVE 04/26/2018 0024   LEUKOCYTESUR Negative 10/20/2012 2042     RADIOLOGY: Dg Chest Portable 1 View  Result Date: 05/03/2018 CLINICAL DATA:  Altered mental status.  Weakness. EXAM: PORTABLE CHEST 1 VIEW COMPARISON:  CT 04/26/2018, CXR 10/26/2012 FINDINGS: Redemonstration of a nodular density projecting over the right mid lung laterally. Mild emphysematous hyperinflation of the lungs with chronic interstitial change. No  alveolar confluence, pulmonary edema, effusion or pneumothorax. Dextroconvex curvature of the mid to upper thoracic spine is redemonstrated. Heart and mediastinal contours are within normal limits with mild-to-moderate aortic atherosclerosis noted at the arch. No aneurysm is identified. No suspicious nor aggressive osseous lesions. IMPRESSION: 1. Emphysematous hyperinflation of the lungs with peripheral right midlung nodular density noted as documented on recent CT. 2. Chronic interstitial change of the lungs without alveolar confluence, edema, effusion or pneumothorax. Electronically Signed   By: Ashley Royalty M.D.   On: 05/03/2018 20:13    EKG: Orders placed or performed during the hospital encounter of 05/03/18  . ED EKG  . ED EKG    IMPRESSION AND PLAN:  1.  Hypoglycemia.  We will discontinue insulin and metformin.  We will start IV D5/0.45 saline.  Continue to monitor blood sugar closely. 2.  Acute metabolic encephalopathy, likely related to hypoglycemia.  We will continue to monitor clinically closely, while treating hypoglycemia. 3.  Metastatic pancreatic cancer, poor long-term prognosis.  Patient is a good candidate for palliative care. 4.  Chronic leukocytosis.  There are no signs of infections at this time, no fever, no infiltrates per chest x-ray.  We will check UA to rule out UTI. 5.  COPD, without acute exacerbation, continue home maintenance therapy.  6.  Hypokalemia, will replace potassium per protocol. 7.  Diabetes type 2.  Will hold metformin and Lantus insulin for now, due to hypoglycemia.  Patient's appetite has been poor in the past few weeks, gradually getting worse.  We will continue to monitor blood sugars closely and adjust insulin treatment as needed.  All the records are reviewed and case discussed with ED provider. Management plans discussed with the patient, who is in agreement.  CODE STATUS: Full Code Status History    Date Active Date Inactive Code Status Order ID  Comments User Context   04/26/2018 0336 04/27/2018 1825 Full Code [id number]  Lance Coon, MD Inpatient       TOTAL TIME TAKING CARE OF THIS PATIENT: 50 minutes.    Amelia Jo M.D on 05/04/2018 at 12:27 AM  Between 7am to 6pm - Pager - [phone]  After 6pm go to [email or web address] - password [email or web address] [facility] [place] [facility]  [phone]  CC: Primary care physician; Sindy Guadeloupe, MD

## 2018-05-04 NOTE — ED Notes (Signed)
Report finished att, Alma Friendly, EDT called for transport

## 2018-05-04 NOTE — Care Management Obs Status (Signed)
[place] NOTIFICATION   Patient Details  Name: NATHEN BALABAN MRN: [id number] Date of Birth: 1941-02-10   Medicare Observation Status Notification Given:  Yes    Almando Brawley A Raymund Manrique, RN 05/04/2018, 4:06 PM

## 2018-05-04 NOTE — Progress Notes (Addendum)
Pharmacy Electrolyte Monitoring Consult:  Pharmacy consulted to assist in monitoring and replacing electrolytes in this 77 y.o. male admitted on 05/03/2018 with Altered Mental Status and Hypoglycemia   Labs:  Sodium (mmol/L)  Date Value  05/04/2018 128 (L)  10/27/2012 131 (L)   Potassium (mmol/L)  Date Value  05/04/2018 3.0 (L)  10/27/2012 3.1 (L)   Calcium (mg/dL)  Date Value  05/04/2018 7.5 (L)   Calcium, Total (mg/dL)  Date Value  10/27/2012 8.0 (L)   Albumin (g/dL)  Date Value  05/03/2018 2.5 (L)  10/22/2012 3.5    Assessment/Plan: Patient w/ metastatic pancreatic cancer w/ hypoglycemia and AMS Patient is hyponatremic/hypokalemic, will replace w/ D5 + NS + 20 mEq KCI @ 125 ml/hr. MD notified and agrees w/ plan. Will recheck electrolytes w/ am labs.  AM follow up:  Fluid rate decreased to 50 ml/hr Will order KCl 40 mEq PO x1 for today Will check add on Mag level  Rayna Sexton, PharmD, BCPS Clinical Pharmacist 05/04/2018 10:08 AM   Addendum:  Add on Mag 1.6 - will order mag sulfate 2 g IV x1 and recheck in AM

## 2018-05-04 NOTE — Progress Notes (Signed)
Patient ID: [name], male   DOB: 02-02-1941, 77 y.o.   MRN: [id number]  ACP note  Patient, wife and daughter at the bedside  Diagnosis: Metastatic pancreatic cancer, anemia, fever, hypoglycemia with diabetes  CODE STATUS discussed.  Patient changed his mind and wishes to be a DO NOT RESUSCITATE  Plan.  Finish transfusion today.  Repeat sonogram of the lower extremities.  Empiric antibiotic for low-grade temperature.  Unsure if this is infection or tumor fever.  Continue to monitor closely.

## 2018-05-04 NOTE — Plan of Care (Signed)

## 2018-05-04 NOTE — Progress Notes (Signed)
Pharmacy Electrolyte Monitoring Consult:  Pharmacy consulted to assist in monitoring and replacing electrolytes in this 77 y.o. male admitted on 05/03/2018 with Altered Mental Status and Hypoglycemia   Labs:  Sodium (mmol/L)  Date Value  05/04/2018 128 (L)  10/27/2012 131 (L)   Potassium (mmol/L)  Date Value  05/04/2018 3.0 (L)  10/27/2012 3.1 (L)   Calcium (mg/dL)  Date Value  05/04/2018 7.5 (L)   Calcium, Total (mg/dL)  Date Value  10/27/2012 8.0 (L)   Albumin (g/dL)  Date Value  05/03/2018 2.5 (L)  10/22/2012 3.5    Assessment/Plan: Patient w/ metastatic pancreatic cancer w/ hypoglycemia and AMS Patient is hyponatremic/hypokalemic, will replace w/ D5 + NS + 20 mEq KCI @ 125 ml/hr. MD notified and agrees w/ plan. Will recheck electrolytes w/ am labs.  Tobie Lords, PharmD, BCPS Clinical Pharmacist 05/04/2018

## 2018-05-05 DIAGNOSIS — R4182 Altered mental status, unspecified: Secondary | ICD-10-CM | POA: Diagnosis not present

## 2018-05-05 DIAGNOSIS — E11649 Type 2 diabetes mellitus with hypoglycemia without coma: Secondary | ICD-10-CM | POA: Diagnosis not present

## 2018-05-05 LAB — CBC
HCT: 24.5 % — ABNORMAL LOW (ref 39.0–52.0)
HEMOGLOBIN: 8.2 g/dL — AB (ref 13.0–17.0)
MCH: 33.6 pg (ref 26.0–34.0)
MCHC: 33.5 g/dL (ref 30.0–36.0)
MCV: 100.4 fL — ABNORMAL HIGH (ref 80.0–100.0)
NRBC: 0 % (ref 0.0–0.2)
PLATELETS: 422 10*3/uL — AB (ref 150–400)
RBC: 2.44 MIL/uL — ABNORMAL LOW (ref 4.22–5.81)
RDW: 16.7 % — ABNORMAL HIGH (ref 11.5–15.5)
WBC: 22 10*3/uL — ABNORMAL HIGH (ref 4.0–10.5)

## 2018-05-05 LAB — BPAM RBC
BLOOD PRODUCT EXPIRATION DATE: 201911052359
ISSUE DATE / TIME: 201910121027
UNIT TYPE AND RH: 5100

## 2018-05-05 LAB — TYPE AND SCREEN
ABO/RH(D): O POS
Antibody Screen: NEGATIVE
UNIT DIVISION: 0

## 2018-05-05 LAB — COMPREHENSIVE METABOLIC PANEL
ALBUMIN: 1.8 g/dL — AB (ref 3.5–5.0)
ALK PHOS: 570 U/L — AB (ref 38–126)
ALT: 37 U/L (ref 0–44)
ANION GAP: 9 (ref 5–15)
AST: 30 U/L (ref 15–41)
BILIRUBIN TOTAL: 4.2 mg/dL — AB (ref 0.3–1.2)
BUN: 8 mg/dL (ref 8–23)
CALCIUM: 7.4 mg/dL — AB (ref 8.9–10.3)
CO2: 29 mmol/L (ref 22–32)
Chloride: 91 mmol/L — ABNORMAL LOW (ref 98–111)
Creatinine, Ser: 0.4 mg/dL — ABNORMAL LOW (ref 0.61–1.24)
GFR calc non Af Amer: >60 mL/min (ref >60)
GLUCOSE: 70 mg/dL (ref 70–99)
POTASSIUM: 3 mmol/L — AB (ref 3.5–5.1)
SODIUM: 129 mmol/L — AB (ref 135–145)
TOTAL PROTEIN: 5.3 g/dL — AB (ref 6.5–8.1)

## 2018-05-05 LAB — GLUCOSE, CAPILLARY
GLUCOSE-CAPILLARY: 236 mg/dL — AB (ref 70–99)
GLUCOSE-CAPILLARY: 254 mg/dL — AB (ref 70–99)
Glucose-Capillary: 114 mg/dL — ABNORMAL HIGH (ref 70–99)

## 2018-05-05 LAB — MAGNESIUM: MAGNESIUM: 1.8 mg/dL (ref 1.7–2.4)

## 2018-05-05 MED ORDER — LANTUS SOLOSTAR 100 UNIT/ML ~~LOC~~ SOPN: PEN_INJECTOR | SUBCUTANEOUS | 12 refills | Status: AC

## 2018-05-05 MED ORDER — POTASSIUM CHLORIDE CRYS ER 20 MEQ PO TBCR
40.0000 meq | EXTENDED_RELEASE_TABLET | Freq: Once | ORAL | Status: AC
  Administered 2018-05-05: 40 meq via ORAL
  Filled 2018-05-05: qty 2

## 2018-05-05 MED ORDER — INSULIN ASPART 100 UNIT/ML ~~LOC~~ SOLN: SUBCUTANEOUS | 11 refills | Status: AC

## 2018-05-05 MED ORDER — POTASSIUM CHLORIDE CRYS ER 20 MEQ PO TBCR: 40.0000 meq | EXTENDED_RELEASE_TABLET | Freq: Once | ORAL | Status: DC

## 2018-05-05 MED ORDER — POTASSIUM CHLORIDE CRYS ER 20 MEQ PO TBCR: 20.0000 meq | EXTENDED_RELEASE_TABLET | Freq: Every day | ORAL | 0 refills | Status: DC

## 2018-05-05 MED ORDER — LEVOFLOXACIN 500 MG PO TABS: 500.0000 mg | ORAL_TABLET | Freq: Every day | ORAL | 0 refills | Status: DC

## 2018-05-05 MED ORDER — MELATONIN 5 MG PO TABS: 5.0000 mg | ORAL_TABLET | Freq: Every day | ORAL | 0 refills | Status: AC

## 2018-05-05 MED ORDER — DOCUSATE SODIUM 100 MG PO CAPS: 100.0000 mg | ORAL_CAPSULE | Freq: Every day | ORAL | 0 refills | Status: AC | PRN

## 2018-05-05 MED ORDER — INSULIN GLARGINE 100 UNIT/ML ~~LOC~~ SOLN
6.0000 [IU] | Freq: Every day | SUBCUTANEOUS | Status: DC
  Administered 2018-05-05: 09:00:00 6 [IU] via SUBCUTANEOUS
  Filled 2018-05-05: qty 0.06

## 2018-05-05 MED ORDER — TRAZODONE HCL 100 MG PO TABS: 100.0000 mg | ORAL_TABLET | Freq: Every day | ORAL | 0 refills | Status: AC

## 2018-05-05 NOTE — Evaluation (Signed)
Physical Therapy Evaluation Patient Details Name: Bryan Hicks MRN: [id number] DOB: [birth year] Today's Date: 05/05/2018   History of Present Illness  Patient is a 77 year old male admitted for AMS related to hypoglycemia.  PMH includes Htn, COPD and DM.  Clinical Impression  Patient is a 77 year old male who lives in a one story home with his wife.  He is independent at baseline with no use of AD.  Pt A&O to self and situation and reported no pain upon PT arrival.  Pt able to stand from recliner without assistance and stand in quiet stance for 10-15 sec.  Pt more comfortable and appears more steady on feet with use of RW at this time.  He has a 3WW which he plans to use at home.  Pt able to ambulate 200 ft with use of RW and no LOB's.  He reported feeling "weak" but presented with good strength of UE/LE with the exception of bilateral HS.  All education regarding gait training for narrow BOS and use of RW completed.  Pt demonstrates good use of RW with no need for correction of posture.  He has plans to continue with an exercise routine following DC.  Due to pt functioning safely with RW and all education requirements being met, pt will not benefit from PT follow up after discharge.  Pt would benefit from [facility] cancer rehabilitation program if eligible.  Will keep pt on PT schedule in hospital to address tolerance to activity.     Follow Up Recommendations No PT follow up;Supervision for mobility/OOB(May benefit from cancer rehabilitation program if eligible.)    Equipment Recommendations  None recommended by PT    Recommendations for Other Services       Precautions / Restrictions Precautions Precautions: Fall Restrictions Weight Bearing Restrictions: No      Mobility  Bed Mobility Overal bed mobility: (Pt in recliner upon PT arrival.)                Transfers Overall transfer level: Modified independent               General transfer comment: Able to stand  without use of UE's.  Ambulation/Gait Ambulation/Gait assistance: Min guard Gait Distance (Feet): 200 Feet Assistive device: Rolling walker (2 wheeled)     Gait velocity interpretation: 1.31 - 2.62 ft/sec, indicative of limited community ambulator General Gait Details: Narrow BOS, good foot clearance and step length.  Pt was more comfortable with use of RW, appearing slighlty unsteady on feet when attempting to walk without it. PT discussed importance of use of his 3ww at home during recovery process and pt was agreeable.  Gait training provided regarding narrow BOS and proper use of RW reviewed.  Pt showed good use of RW.  Stairs            Wheelchair Mobility    Modified Rankin (Stroke Patients Only)       Balance Overall balance assessment: Needs assistance Sitting-balance support: Feet supported Sitting balance-Leahy Scale: Good     Standing balance support: Bilateral upper extremity supported Standing balance-Leahy Scale: Fair                               Pertinent Vitals/Pain Pain Assessment: No/denies pain    Home Living Family/patient expects to be discharged to:: Private residence Living Arrangements: Spouse/significant other;Children Available Help at Discharge: Family;Available 24 hours/day Type of Home: House Home Access:  Level entry     Home Layout: One level   Additional Comments: 3 WW    Prior Function Level of Independence: Independent         Comments: 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     Hand Dominance        Extremity/Trunk Assessment   Upper Extremity Assessment Upper Extremity Assessment: Overall WFL for tasks assessed    Lower Extremity Assessment Lower Extremity Assessment: Overall WFL for tasks assessed(Mild HS weakness noted during MMT which pt stated that he felt too weak to sustain a 5 sec contraction.  No sensation loss in feet.)    Cervical / Trunk  Assessment Cervical / Trunk Assessment: Normal  Communication   Communication: No difficulties  Cognition Arousal/Alertness: Awake/alert Behavior During Therapy: WFL for tasks assessed/performed Overall Cognitive Status: Within Functional Limits for tasks assessed                                 General Comments: Pt is alert and oriented to self and situation though he did forget his room number when ambulating in hallway.  Follows commands consistently.      General Comments      Exercises     Assessment/Plan    PT Assessment Patent does not need any further PT services  PT Problem List Decreased activity tolerance;Decreased balance       PT Treatment Interventions Gait training;Therapeutic activities;Therapeutic exercise;Functional mobility training    PT Goals (Current goals can be found in the Care Plan section)  Acute Rehab PT Goals Patient Stated Goal: To return to playing golf and to start an exercise program at [employer]. PT Goal Formulation: With patient Time For Goal Achievement: 05/19/18 Potential to Achieve Goals: Good    Frequency Min 2X/week   Barriers to discharge        Co-evaluation               AM-PAC PT "6 Clicks" Daily Activity  Outcome Measure Difficulty turning over in bed (including adjusting bedclothes, sheets and blankets)?: None Difficulty moving from lying on back to sitting on the side of the bed? : None Difficulty sitting down on and standing up from a chair with arms (e.g., wheelchair, bedside commode, etc,.)?: None Help needed moving to and from a bed to chair (including a wheelchair)?: None Help needed walking in hospital room?: None Help needed climbing 3-5 steps with a railing? : A Little 6 Click Score: 23    End of Session Equipment Utilized During Treatment: Gait belt Activity Tolerance: Patient tolerated treatment well Patient left: in chair;with chair alarm set;with call bell/phone within  reach Nurse Communication: Mobility status PT Visit Diagnosis: Unsteadiness on feet (R26.81)    Time: 1255-1316 PT Time Calculation (min) (ACUTE ONLY): 21 min   Charges:   PT Evaluation $PT Eval Low Complexity: 1 Low PT Treatments $Gait Training: 8-22 mins        Roxanne Gates, PT, DPT   Roxanne Gates 05/05/2018, 1:41 PM

## 2018-05-05 NOTE — Progress Notes (Signed)
Pharmacy Electrolyte Monitoring Consult:  Pharmacy consulted to assist in monitoring and replacing electrolytes in this 77 y.o. male admitted on 05/03/2018 with Altered Mental Status and Hypoglycemia Patient w/ metastatic pancreatic cancer w/ hypoglycemia and AMS Patient is hyponatremic/hypokalemic   Labs:  Sodium (mmol/L)  Date Value  05/05/2018 129 (L)  10/27/2012 131 (L)   Potassium (mmol/L)  Date Value  05/05/2018 3.0 (L)  10/27/2012 3.1 (L)   Magnesium (mg/dL)  Date Value  05/05/2018 1.8   Calcium (mg/dL)  Date Value  05/05/2018 7.4 (L)   Calcium, Total (mg/dL)  Date Value  10/27/2012 8.0 (L)   Albumin (g/dL)  Date Value  05/05/2018 1.8 (L)  10/22/2012 3.5    Assessment/Plan: K 3.0, Mag 1.8 - Fluids stopped yesterday  MD has ordered KCl 40 mEq PO x1. Will order another KCl 40 mEq PO x1 as K is unchanged from 3.0 yesterday despite KCl 40 mEq PO x1 and IV fluids yesterday. Recheck labs in AM.  Pharmacy will continue to follow.   Rayna Sexton, PharmD, BCPS Clinical Pharmacist 05/05/2018 9:04 AM

## 2018-05-05 NOTE — Discharge Summary (Signed)
[facility] [place] NAME: Bryan Hicks    MR#:  [id number]  DATE OF BIRTH:  September 18, 1940  DATE OF ADMISSION:  05/03/2018 ADMITTING PHYSICIAN: Amelia Jo, MD  DATE OF DISCHARGE: 05/05/2018  PRIMARY CARE PHYSICIAN: Sindy Guadeloupe, MD    ADMISSION DIAGNOSIS:  Hypoglycemia [E16.2] Altered mental status, unspecified altered mental status type [R41.82]  DISCHARGE DIAGNOSIS:  Active Problems:   Hypoglycemia   SECONDARY DIAGNOSIS:   Past Medical History:  Diagnosis Date  . COPD (chronic obstructive pulmonary disease) ([place])   . Diabetes mellitus without complication ([place])   . Hypertension   . Shortness of breath dyspnea     HOSPITAL COURSE:   1.  Acute on chronic anemia.  Patient was transfused 1 unit of packed red blood cells for hemoglobin of 6.9 and came up to 8.2.  Patient feeling a lot better on the day of discharge. 2.  Metastatic pancreatic cancer.  Overall prognosis is poor.  Made a DNR in the hospital yesterday.  Daughter thinking about palliative care and potentially hospice.  They will follow-up with Dr. Janese Banks as outpatient for further options chemotherapy versus palliative care and hospice. 3.  Acute metabolic encephalopathy.  This has improved. 4.  Type 2 diabetes mellitus with hypoglycemia.  The patient was held on his medications and then mental status improved.  The patient is variable on how much he eats.  I decreased his Lantus down to 6 units daily and will give short acting insulin 5 units prior to meals if he is eating. 5.  Fever unclear what I am treating.  I did prescribe Levaquin for another few days.  Sonogram of the lower extremities was negative for DVT.  This could be tumor fever.  Temperature only 99.  On discharge it was normal. 6.  Hypokalemia and hypomagnesemia and hyponatremia.  Replace potassium and magnesium during the hospital course.  Sodium should also come up with eating. 7.  COPD 8.  Overall prognosis is  poor based on his metastatic pancreatic cancer 9.  Patient walked around well with physical therapy and they did not recommend any home health needs 10.  Daughter mentioned insomnia I will try melatonin 5 mg at night.  Patient already on trazodone 11.  There was no atrial fibrillation on the hospital course.  EKG shows sinus arrhythmia.  DISCHARGE CONDITIONS:   Fair  CONSULTS OBTAINED:  None  DRUG ALLERGIES:  No Known Allergies  DISCHARGE MEDICATIONS:   Allergies as of 05/05/2018   No Known Allergies     Medication List    STOP taking these medications   aspirin EC 81 MG tablet   metFORMIN 500 MG 24 hr tablet Commonly known as:  GLUCOPHAGE-XR     TAKE these medications   docusate sodium 100 MG capsule Commonly known as:  COLACE Take 1 capsule (100 mg total) by mouth daily as needed for mild constipation.   Fluticasone-Salmeterol 250-50 MCG/DOSE Aepb Commonly known as:  ADVAIR Inhale 1 puff into the lungs 2 (two) times daily.   insulin aspart 100 UNIT/ML injection Commonly known as:  novoLOG 5 units subcutaneous prior to meals if sugars greater than 150   LANTUS SOLOSTAR 100 UNIT/ML Solostar Pen Generic drug:  Insulin Glargine 6 units suncutanous injection daily What changed:    how much to take  how to take this  when to take this  additional instructions   levofloxacin 500 MG tablet Commonly known as:  LEVAQUIN Take 1  tablet (500 mg total) by mouth daily.   magnesium chloride 64 MG Tbec SR tablet Commonly known as:  SLOW-MAG Take 1 tablet by mouth daily.   Melatonin 5 MG Tabs Take 1 tablet (5 mg total) by mouth at bedtime.   oxyCODONE 5 MG immediate release tablet Commonly known as:  Oxy IR/ROXICODONE Take 1 tablet (5 mg total) by mouth every 8 (eight) hours as needed for severe pain.   potassium chloride SA 20 MEQ tablet Commonly known as:  K-DUR,KLOR-CON Take 1 tablet (20 mEq total) by mouth daily.   traZODone 100 MG tablet Commonly known  as:  DESYREL Take 1 tablet (100 mg total) by mouth at bedtime. What changed:    medication strength  how much to take  how to take this  when to take this   vitamin B-12 1000 MCG tablet Commonly known as:  CYANOCOBALAMIN Take 1,000 mcg by mouth daily.        DISCHARGE INSTRUCTIONS:   Follow-up Dr. Janese Banks this week  If you experience worsening of your admission symptoms, develop shortness of breath, life threatening emergency, suicidal or homicidal thoughts you must seek medical attention immediately by calling 911 or calling your MD immediately  if symptoms less severe.  You Must read complete instructions/literature along with all the possible adverse reactions/side effects for all the Medicines you take and that have been prescribed to you. Take any new Medicines after you have completely understood and accept all the possible adverse reactions/side effects.   Please note  You were cared for by a hospitalist during your hospital stay. If you have any questions about your discharge medications or the care you received while you were in the hospital after you are discharged, you can call the unit and asked to speak with the hospitalist on call if the hospitalist that took care of you is not available. Once you are discharged, your primary care physician will handle any further medical issues. Please note that NO REFILLS for any discharge medications will be authorized once you are discharged, as it is imperative that you return to your primary care physician (or establish a relationship with a primary care physician if you do not have one) for your aftercare needs so that they can reassess your need for medications and monitor your lab values.    Today   CHIEF COMPLAINT:   Chief Complaint  Patient presents with  . Altered Mental Status  . Hypoglycemia    HISTORY OF PRESENT ILLNESS:  Bryan Hicks  is a 77 y.o. male presented with altered mental status and  hypoglycemia   VITAL SIGNS:  Blood pressure (!) 128/57, pulse (!) 109, temperature 98 F (36.7 C), temperature source Oral, resp. rate 16, height 6' (1.829 m), weight 71.3 kg, SpO2 97 %.    PHYSICAL EXAMINATION:  GENERAL:  77 y.o.-year-old patient lying in the bed with no acute distress.  EYES: Pupils equal, round, reactive to light and accommodation.  Positive scleral icterus. Extraocular muscles intact.  HEENT: Head atraumatic, normocephalic. Oropharynx and nasopharynx clear.  NECK:  Supple, no jugular venous distention. No thyroid enlargement, no tenderness.  LUNGS: Normal breath sounds bilaterally, no wheezing, rales,rhonchi or crepitation. No use of accessory muscles of respiration.  CARDIOVASCULAR: S1, S2 normal. No murmurs, rubs, or gallops.  ABDOMEN: Soft, non-tender, non-distended. Bowel sounds present. No organomegaly or mass.  EXTREMITIES: No pedal edema, cyanosis, or clubbing.  NEUROLOGIC: Cranial nerves II through XII are intact. Muscle strength 5/5 in all extremities. Sensation  intact. Gait not checked.  PSYCHIATRIC: The patient is alert and oriented x 3.  SKIN: No obvious rash, lesion, or ulcer.  Positive jaundice  DATA REVIEW:   CBC Recent Labs  Lab 05/05/18 0404  WBC 22.0*  HGB 8.2*  HCT 24.5*  PLT 422*    Chemistries  Recent Labs  Lab 05/05/18 0404  NA 129*  K 3.0*  CL 91*  CO2 29  GLUCOSE 70  BUN 8  CREATININE 0.40*  CALCIUM 7.4*  MG 1.8  AST 30  ALT 37  ALKPHOS 570*  BILITOT 4.2*    Cardiac Enzymes Recent Labs  Lab 05/03/18 1944  TROPONINI 0.04*    Microbiology Results  Results for orders placed or performed during the hospital encounter of 04/25/18  CULTURE, BLOOD (ROUTINE X 2) w Reflex to ID Panel     Status: None   Collection Time: 04/26/18  8:12 AM  Result Value Ref Range Status   Specimen Description BLOOD BLOOD LEFT HAND  Final   Special Requests   Final    BOTTLES DRAWN AEROBIC AND ANAEROBIC Blood Culture adequate volume    Culture   Final    NO GROWTH 5 DAYS Performed at [facility], [address] [place], [place] [address]    Report Status 05/01/2018 FINAL  Final  CULTURE, BLOOD (ROUTINE X 2) w Reflex to ID Panel     Status: None   Collection Time: 04/26/18  8:12 AM  Result Value Ref Range Status   Specimen Description BLOOD BLOOD RIGHT HAND  Final   Special Requests   Final    BOTTLES DRAWN AEROBIC AND ANAEROBIC Blood Culture adequate volume   Culture   Final    NO GROWTH 5 DAYS Performed at [facility], [address] [place], [place] [address]    Report Status 05/01/2018 FINAL  Final    RADIOLOGY:  US Venous Img Lower Bilateral  Result Date: 05/04/2018 CLINICAL DATA:  Swelling and fevers EXAM: BILATERAL LOWER EXTREMITY VENOUS DOPPLER ULTRASOUND TECHNIQUE: Gray-scale sonography with graded compression, as well as color Doppler and duplex ultrasound were performed to evaluate the lower extremity deep venous systems from the level of the common femoral vein and including the common femoral, femoral, profunda femoral, popliteal and calf veins including the posterior tibial, peroneal and gastrocnemius veins when visible. The superficial great saphenous vein was also interrogated. Spectral Doppler was utilized to evaluate flow at rest and with distal augmentation maneuvers in the common femoral, femoral and popliteal veins. COMPARISON:  None. FINDINGS: RIGHT LOWER EXTREMITY Common Femoral Vein: No evidence of thrombus. Normal compressibility, respiratory phasicity and response to augmentation. Saphenofemoral Junction: No evidence of thrombus. Normal compressibility and flow on color Doppler imaging. Profunda Femoral Vein: No evidence of thrombus. Normal compressibility and flow on color Doppler imaging. Femoral Vein: No evidence of thrombus. Normal compressibility, respiratory phasicity and response to augmentation. Popliteal Vein: No evidence of thrombus. Normal compressibility,  respiratory phasicity and response to augmentation. Calf Veins: No evidence of thrombus. Normal compressibility and flow on color Doppler imaging. Superficial Great Saphenous Vein: No evidence of thrombus. Normal compressibility. Venous Reflux:  None. Other Findings: Better visualized on the current exam is a popliteal cyst medially. LEFT LOWER EXTREMITY Common Femoral Vein: No evidence of thrombus. Normal compressibility, respiratory phasicity and response to augmentation. Saphenofemoral Junction: No evidence of thrombus. Normal compressibility and flow on color Doppler imaging. Profunda Femoral Vein: No evidence of thrombus. Normal compressibility and flow on color Doppler imaging. Femoral  Vein: No evidence of thrombus. Normal compressibility, respiratory phasicity and response to augmentation. Popliteal Vein: No evidence of thrombus. Normal compressibility, respiratory phasicity and response to augmentation. Calf Veins: No evidence of thrombus. Normal compressibility and flow on color Doppler imaging. Superficial Great Saphenous Vein: No evidence of thrombus. Normal compressibility. Venous Reflux:  None. Other Findings: Better seen on the current exam is a popliteal cyst medially. IMPRESSION: No evidence of deep venous thrombosis bilaterally. Better visualized on today's exam are bilateral popliteal cysts Electronically Signed   By: Inez Catalina M.D.   On: 05/04/2018 14:31   Dg Chest Portable 1 View  Result Date: 05/03/2018 CLINICAL DATA:  Altered mental status.  Weakness. EXAM: PORTABLE CHEST 1 VIEW COMPARISON:  CT 04/26/2018, CXR 10/26/2012 FINDINGS: Redemonstration of a nodular density projecting over the right mid lung laterally. Mild emphysematous hyperinflation of the lungs with chronic interstitial change. No alveolar confluence, pulmonary edema, effusion or pneumothorax. Dextroconvex curvature of the mid to upper thoracic spine is redemonstrated. Heart and mediastinal contours are within normal  limits with mild-to-moderate aortic atherosclerosis noted at the arch. No aneurysm is identified. No suspicious nor aggressive osseous lesions. IMPRESSION: 1. Emphysematous hyperinflation of the lungs with peripheral right midlung nodular density noted as documented on recent CT. 2. Chronic interstitial change of the lungs without alveolar confluence, edema, effusion or pneumothorax. Electronically Signed   By: Ashley Royalty M.D.   On: 05/03/2018 20:13    Management plans discussed with the patient, family and they are in agreement.  CODE STATUS:     Code Status Orders  (From admission, onward)         Start     Ordered   05/04/18 1331  Do not attempt resuscitation (DNR)  Continuous    Question Answer Comment  In the event of cardiac or respiratory ARREST Do not call a "code blue"   In the event of cardiac or respiratory ARREST Do not perform Intubation, CPR, defibrillation or ACLS   In the event of cardiac or respiratory ARREST Use medication by any route, position, wound care, and other measures to relive pain and suffering. May use oxygen, suction and manual treatment of airway obstruction as needed for comfort.   Comments nurse may pronounce      05/04/18 1330        Code Status History    Date Active Date Inactive Code Status Order ID Comments User Context   05/04/2018 0101 05/04/2018 1330 Full Code [id number]  Amelia Jo, MD Inpatient   04/26/2018 0336 04/27/2018 1825 Full Code [id number]  Lance Coon, MD Inpatient    Advance Directive Documentation     Most Recent Value  Type of Advance Directive  Healthcare Power of [place], Living will  Pre-existing out of facility DNR order (yellow form or pink MOST form)  -  "MOST" Form in Place?  -      TOTAL TIME TAKING CARE OF THIS PATIENT: 35 minutes.    Loletha Grayer M.D on 05/05/2018 at 2:08 PM  Between 7am to 6pm - Pager - [phone]  After 6pm go to [email or web address] - password [email or web address] [facility]   [phone]  CC: Primary care physician; Sindy Guadeloupe, MD

## 2018-05-06 LAB — GLUCOSE, CAPILLARY: Glucose-Capillary: 36 mg/dL — CL (ref 70–99)

## 2018-05-09 ENCOUNTER — Inpatient Hospital Stay (HOSPITAL_BASED_OUTPATIENT_CLINIC_OR_DEPARTMENT_OTHER): Payer: Medicare Other | Admitting: Hospice and Palliative Medicine

## 2018-05-09 ENCOUNTER — Inpatient Hospital Stay: Payer: Medicare Other

## 2018-05-09 ENCOUNTER — Encounter: Payer: Self-pay | Admitting: Oncology

## 2018-05-09 ENCOUNTER — Ambulatory Visit: Payer: Medicare Other

## 2018-05-09 ENCOUNTER — Inpatient Hospital Stay (HOSPITAL_BASED_OUTPATIENT_CLINIC_OR_DEPARTMENT_OTHER): Payer: Medicare Other | Admitting: Oncology

## 2018-05-09 ENCOUNTER — Other Ambulatory Visit: Payer: Self-pay

## 2018-05-09 ENCOUNTER — Other Ambulatory Visit: Payer: Self-pay | Admitting: *Deleted

## 2018-05-09 VITALS — BP 108/70 | HR 116 | Temp 97.5°F | Resp 18 | Ht 72.0 in | Wt 136.5 lb

## 2018-05-09 DIAGNOSIS — C779 Secondary and unspecified malignant neoplasm of lymph node, unspecified: Secondary | ICD-10-CM | POA: Diagnosis not present

## 2018-05-09 DIAGNOSIS — Z7189 Other specified counseling: Secondary | ICD-10-CM

## 2018-05-09 DIAGNOSIS — K831 Obstruction of bile duct: Secondary | ICD-10-CM | POA: Diagnosis not present

## 2018-05-09 DIAGNOSIS — Z515 Encounter for palliative care: Secondary | ICD-10-CM

## 2018-05-09 DIAGNOSIS — G893 Neoplasm related pain (acute) (chronic): Secondary | ICD-10-CM | POA: Diagnosis not present

## 2018-05-09 DIAGNOSIS — C259 Malignant neoplasm of pancreas, unspecified: Secondary | ICD-10-CM

## 2018-05-09 DIAGNOSIS — R531 Weakness: Secondary | ICD-10-CM | POA: Diagnosis not present

## 2018-05-09 LAB — COMPREHENSIVE METABOLIC PANEL
ALBUMIN: 2.3 g/dL — AB (ref 3.5–5.0)
ALT: 32 U/L (ref 0–44)
AST: 32 U/L (ref 15–41)
Alkaline Phosphatase: 459 U/L — ABNORMAL HIGH (ref 38–126)
Anion gap: 10 (ref 5–15)
BUN: 6 mg/dL — AB (ref 8–23)
CHLORIDE: 90 mmol/L — AB (ref 98–111)
CO2: 30 mmol/L (ref 22–32)
CREATININE: 0.56 mg/dL — AB (ref 0.61–1.24)
Calcium: 8.3 mg/dL — ABNORMAL LOW (ref 8.9–10.3)
GFR calc Af Amer: >60 mL/min (ref >60)
GFR calc non Af Amer: >60 mL/min (ref >60)
GLUCOSE: 131 mg/dL — AB (ref 70–99)
Potassium: 3.2 mmol/L — ABNORMAL LOW (ref 3.5–5.1)
SODIUM: 130 mmol/L — AB (ref 135–145)
Total Bilirubin: 3.8 mg/dL — ABNORMAL HIGH (ref 0.3–1.2)
Total Protein: 5.9 g/dL — ABNORMAL LOW (ref 6.5–8.1)

## 2018-05-09 LAB — CBC WITH DIFFERENTIAL/PLATELET
ABS IMMATURE GRANULOCYTES: 0.11 10*3/uL — AB (ref 0.00–0.07)
BASOS ABS: 0 10*3/uL (ref 0.0–0.1)
Basophils Relative: 0 %
Eosinophils Absolute: 0 10*3/uL (ref 0.0–0.5)
Eosinophils Relative: 0 %
HCT: 31.6 % — ABNORMAL LOW (ref 39.0–52.0)
Hemoglobin: 10.3 g/dL — ABNORMAL LOW (ref 13.0–17.0)
IMMATURE GRANULOCYTES: 1 %
Lymphocytes Relative: 12 %
Lymphs Abs: 1.6 10*3/uL (ref 0.7–4.0)
MCH: 32.8 pg (ref 26.0–34.0)
MCHC: 32.6 g/dL (ref 30.0–36.0)
MCV: 100.6 fL — ABNORMAL HIGH (ref 80.0–100.0)
MONOS PCT: 7 %
Monocytes Absolute: 1 10*3/uL (ref 0.1–1.0)
NEUTROS ABS: 11.1 10*3/uL — AB (ref 1.7–7.7)
NEUTROS PCT: 80 %
NRBC: 0 % (ref 0.0–0.2)
Platelets: 509 10*3/uL — ABNORMAL HIGH (ref 150–400)
RBC: 3.14 MIL/uL — ABNORMAL LOW (ref 4.22–5.81)
RDW: 15.2 % (ref 11.5–15.5)
WBC: 13.9 10*3/uL — ABNORMAL HIGH (ref 4.0–10.5)

## 2018-05-09 MED ORDER — LORAZEPAM 0.5 MG PO TABS: 0.5000 mg | ORAL_TABLET | Freq: Every evening | ORAL | 0 refills | Status: DC | PRN

## 2018-05-09 NOTE — Progress Notes (Signed)
Bryan Hicks  [phone] [phone]   Name: Bryan Hicks Date: 05/09/2018 MRN: [id number]  DOB: Nov 29, 1940  Patient Care Team: Sindy Guadeloupe, MD as PCP - General (Oncology) Clent Jacks, RN as Registered Nurse    REASON FOR CONSULTATION: Palliative Care consult requested for this 77 y.o. 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  PMH is also significant for COPD, diabetes, and hypertension.  She was recently hospitalized 05/03/2018 to 05/05/2018 for altered mental status likely secondary to hypoglycemia.,  Patient was also treated for fever of unknown origin and acute on chronic anemia requiring blood transfusion.  He was referred to the palliative care clinic for symptom management and further discussion of goals.   SOCIAL HISTORY:    Patient is married and lives at home with his wife and daughter.  Patient has a son that lives in [place].  Patient used to work as a Administrator.  ADVANCE DIRECTIVES:  Not on file  CODE STATUS: DNR  PAST MEDICAL HISTORY: Past Medical History:  Diagnosis Date  . COPD (chronic obstructive pulmonary disease) ([place])   . Diabetes mellitus without complication ([place])   . Hypertension   . Shortness of breath dyspnea     PAST SURGICAL HISTORY:  Past Surgical History:  Procedure Laterality Date  . COLON SURGERY     "twisted intestine"  . ERCP N/A 04/26/2018   Procedure: ENDOSCOPIC RETROGRADE CHOLANGIOPANCREATOGRAPHY (ERCP);  Surgeon: Lucilla Lame, MD;  Location: [facility] ENDOSCOPY;  Service: Endoscopy;  Laterality: N/A;  . HERNIA MESH REMOVAL Right   . INGUINAL HERNIA REPAIR Left 12/04/2014   Procedure: HERNIA REPAIR INGUINAL ADULT;  Surgeon: Rochel Brome, MD;  Location: [facility] ORS;  Service: General;  Laterality: Left;     HEMATOLOGY/ONCOLOGY HISTORY:   No history exists.    ALLERGIES:  has No Known Allergies.  MEDICATIONS:  Current Outpatient Medications  Medication Sig Dispense Refill  . docusate sodium (COLACE) 100 MG capsule Take 1 capsule (100 mg total) by mouth daily as needed for mild constipation. (Patient not taking: Reported on 05/09/2018) 60 capsule 0  . Fluticasone-Salmeterol (ADVAIR) 250-50 MCG/DOSE AEPB Inhale 1 puff into the lungs 2 (two) times daily.     . insulin aspart (NOVOLOG) 100 UNIT/ML injection 5 units subcutaneous prior to meals if sugars greater than 150 10 mL 11  . LANTUS SOLOSTAR 100 UNIT/ML Solostar Pen 6 units suncutanous injection daily 15 mL 12  . levofloxacin (LEVAQUIN) 500 MG tablet Take 1 tablet (500 mg total) by mouth daily. 5 tablet 0  . magnesium chloride (SLOW-MAG) 64 MG TBEC SR tablet Take 1 tablet by mouth daily.    . Melatonin 5 MG TABS Take 1 tablet (5 mg total) by mouth at bedtime. (Patient not taking: Reported on 05/09/2018) 30 tablet 0  . oxyCODONE (OXY IR/ROXICODONE) 5 MG immediate release tablet Take 1 tablet (5 mg total) by mouth every 8 (eight) hours as needed for severe pain. (Patient not taking: Reported on 05/09/2018) 90 tablet 0  . potassium chloride SA (K-DUR,KLOR-CON) 20 MEQ tablet Take 1 tablet (20 mEq total) by mouth daily. 30 tablet 0  . traZODone (DESYREL) 100 MG tablet Take 1 tablet (100 mg total) by mouth at bedtime. (Patient not taking: Reported on 05/09/2018) 30 tablet 0  . vitamin B-12 (CYANOCOBALAMIN) 1000 MCG tablet Take 1,000 mcg by mouth daily.  No current facility-administered medications for this visit.     VITAL SIGNS: There were no vitals taken for this visit. There were no vitals filed for this visit.  Estimated body mass index is 18.51 kg/m as calculated from the following:   Height as of an earlier encounter on 05/09/18: 6' (1.829 m).   Weight as of an earlier encounter on 05/09/18: 136 lb 8 oz (61.9 kg).  LABS: CBC:     Component Value Date/Time   WBC 13.9 (H) 05/09/2018 0913   HGB 10.3 (L) 05/09/2018 0913   HGB 10.9 (L) 10/27/2012 0427   HCT 31.6 (L) 05/09/2018 0913   HCT 31.9 (L) 10/27/2012 0427   PLT 509 (H) 05/09/2018 0913   PLT 191 10/27/2012 0427   MCV 100.6 (H) 05/09/2018 0913   MCV 99 10/27/2012 0427   NEUTROABS 11.1 (H) 05/09/2018 0913   NEUTROABS 5.1 10/27/2012 0427   LYMPHSABS 1.6 05/09/2018 0913   LYMPHSABS 1.2 10/27/2012 0427   MONOABS 1.0 05/09/2018 0913   MONOABS 1.0 10/27/2012 0427   EOSABS 0.0 05/09/2018 0913   EOSABS 0.1 10/27/2012 0427   BASOSABS 0.0 05/09/2018 0913   BASOSABS 0.0 10/27/2012 0427   Comprehensive Metabolic Panel:    Component Value Date/Time   NA 130 (L) 05/09/2018 0913   NA 131 (L) 10/27/2012 0427   K 3.2 (L) 05/09/2018 0913   K 3.1 (L) 10/27/2012 0427   CL 90 (L) 05/09/2018 0913   CL 94 (L) 10/27/2012 0427   CO2 30 05/09/2018 0913   CO2 29 10/27/2012 0427   BUN 6 (L) 05/09/2018 0913   BUN 9 10/27/2012 0427   CREATININE 0.56 (L) 05/09/2018 0913   CREATININE 0.74 10/27/2012 0427   GLUCOSE 131 (H) 05/09/2018 0913   GLUCOSE 157 (H) 10/27/2012 0427   CALCIUM 8.3 (L) 05/09/2018 0913   CALCIUM 8.0 (L) 10/27/2012 0427   AST 32 05/09/2018 0913   AST 134 (H) 10/22/2012 0501   ALT 32 05/09/2018 0913   ALT 202 (H) 10/22/2012 0501   ALKPHOS 459 (H) 05/09/2018 0913   ALKPHOS 65 10/22/2012 0501   BILITOT 3.8 (H) 05/09/2018 0913   BILITOT 1.5 (H) 10/22/2012 0501   PROT 5.9 (L) 05/09/2018 0913   PROT 6.7 10/22/2012 0501   ALBUMIN 2.3 (L) 05/09/2018 0913   ALBUMIN 3.5 10/22/2012 0501    RADIOGRAPHIC STUDIES: Ct Chest W Contrast  Result Date: 04/26/2018 CLINICAL DATA:  Inpatient. Pancreatic head mass with biliary and pancreatic duct dilation, jaundice, abdominal pain. Chest staging. EXAM: CT CHEST WITH CONTRAST TECHNIQUE: Multidetector CT imaging of the chest was performed during intravenous contrast administration. CONTRAST:  63m OMNIPAQUE IOHEXOL 300  MG/ML  SOLN COMPARISON:  04/25/2018 CT abdomen/pelvis. 10/26/2012 chest radiograph. FINDINGS: Cardiovascular: Normal heart size. No significant pericardial effusion/thickening. Three-vessel coronary atherosclerosis. Atherosclerotic nonaneurysmal thoracic aorta. Dilated main pulmonary artery (3.5 cm diameter). No central pulmonary emboli. Mediastinum/Nodes: No discrete thyroid nodules. Unremarkable esophagus. No axillary adenopathy. Mildly enlarged 1.3 cm right hilar node (series 2/image 85). No left hilar adenopathy. No pathologically enlarged mediastinal nodes. Lungs/Pleura: No pneumothorax. Small dependent right pleural effusion. No left pleural effusion. Moderate to severe centrilobular emphysema with diffuse bronchial wall thickening. Solid right upper lobe 1.2 cm pulmonary nodule (series 3/image 73). No lung masses or additional significant pulmonary nodules. Mild-to-moderate elevation of the right hemidiaphragm. Patchy somewhat bandlike consolidation and ground-glass opacity at the right lung base with associated volume loss. Upper abdomen: Small hiatal hernia. Re-demonstration prominent diffuse intrahepatic biliary ductal dilatation and  pancreatic duct dilation, unchanged. Small to moderate volume upper abdominal ascites. Partial visualization of necrotic adenopathy in the gastrohepatic ligament, unchanged. Musculoskeletal: No aggressive appearing focal osseous lesions. Mild thoracic spondylosis. IMPRESSION: 1. Solitary 1.2 cm solid right upper lobe pulmonary nodule, malignancy not excluded, equivocal for solitary metastasis versus primary bronchogenic carcinoma. 2. Mild right hilar adenopathy, cannot exclude nodal metastatic disease. 3. Small dependent right pleural effusion. 4. Mild-to-moderate elevation of the right hemidiaphragm with patchy somewhat bandlike consolidation and ground-glass opacity at the right lung base with associated volume loss, favor atelectasis/scarring, cannot exclude a component of  pneumonia. 5. Redemonstration of ascites, necrotic gastrohepatic ligament adenopathy and biliary and pancreatic dilatation in the upper abdomen, unchanged. 6. Dilated main pulmonary artery, suggesting pulmonary arterial hypertension. 7. Three-vessel coronary atherosclerosis. 8. Small hiatal hernia. Aortic Atherosclerosis (ICD10-I70.0) and Emphysema (ICD10-J43.9). Electronically Signed   By: Ilona Sorrel M.D.   On: 04/26/2018 11:19   Ct Abdomen Pelvis W Contrast  Result Date: 04/25/2018 CLINICAL DATA:  Acute abdominal pain, hypoglycemia EXAM: CT ABDOMEN AND PELVIS WITH CONTRAST TECHNIQUE: Multidetector CT imaging of the abdomen and pelvis was performed using the standard protocol following bolus administration of intravenous contrast. CONTRAST:  141m ISOVUE-300 IOPAMIDOL (ISOVUE-300) INJECTION 61% COMPARISON:  10/21/2012 FINDINGS: Lower chest: Mild right lower lobe atelectasis. Hepatobiliary: Liver is within normal limits. Moderate intrahepatic and extrahepatic ductal dilatation. Common duct measures up to 2.4 cm (coronal image 40) with extrinsic compression by a dominant cystic/necrotic lesion in the porta hepatitis. Gallbladder is unremarkable. No intrahepatic or extrahepatic ductal dilatation. Pancreas: 2.2 x 2.9 cm hypoenhancing lesion in the pancreatic head (series 2/image 36), highly suspicious for pancreatic neoplasm. Severe dilatation of the main pancreatic duct in the body/tail (series 2/image 27). Associated pancreatic atrophy. Peripancreatic fluid collections with enhancement along the anterior aspect of the pancreatic body/tail (series 2/image 27). Additional 4.1 x 3.8 cm cystic/necrotic lesion in the region of the pancreatic head, with mass effect on the common duct. Spleen: Within normal limits. Adrenals/Urinary Tract: Bilateral adrenal nodules, measuring up to 2.7 cm on the left,similar to 2014 and likely reflecting benign adrenal adenomas. Kidneys are within normal limits.  No hydronephrosis.  Bladder is grossly unremarkable. Stomach/Bowel: Stomach is notable for a tiny hiatal hernia. Visualized bowel is grossly unremarkable. Appendix is not discretely visualized. Vascular/Lymphatic: No evidence of abdominal aortic aneurysm. No vascular involvement. Atherosclerotic calcifications of the abdominal aorta and branch vessels. No suspicious abdominopelvic lymphadenopathy. Reproductive: Prostate is grossly unremarkable. Other: Moderate abdominopelvic ascites. Postsurgical changes related to prior right ventral hernia mesh repair. Musculoskeletal: Grade 1 anterolisthesis at L5-S1. IMPRESSION: 2.2 x 2.9 cm hypoenhancing lesion in the pancreatic head, highly suspicious for pancreatic neoplasm. Associated double duct sign with intrahepatic/extrahepatic ductal dilatation and dilatation of the pancreatic duct. Associated atrophy of the pancreatic body/tail. Associated peripancreatic fluid collections versus necrotic lesions, including a dominant 4.1 x 3.8 cm cystic lesion superior to the pancreatic head, with mass effect on the distal common duct. Moderate abdominopelvic ascites. Additional ancillary findings as above. Electronically Signed   By: SJulian HyM.D.   On: 04/25/2018 23:01   UKoreaAbdomen Limited  Result Date: 04/26/2018 CLINICAL DATA:  Pancreatic mass.  Moderate ascites described on CT EXAM: LIMITED ABDOMEN ULTRASOUND FOR ASCITES TECHNIQUE: Limited ultrasound survey for ascites was performed in all four abdominal quadrants. COMPARISON:  CT 04/25/2018 FINDINGS: There is a small amount of scattered abdominal ascites. No large pocket for safe paracentesis. IMPRESSION: Small amount of scattered abdominal ascites.  Paracentesis deferred. Electronically Signed  By: Lucrezia Europe M.D.   On: 04/26/2018 16:14   US Venous Img Lower Bilateral  Result Date: 05/04/2018 CLINICAL DATA:  Swelling and fevers EXAM: BILATERAL LOWER EXTREMITY VENOUS DOPPLER ULTRASOUND TECHNIQUE: Gray-scale sonography with graded  compression, as well as color Doppler and duplex ultrasound were performed to evaluate the lower extremity deep venous systems from the level of the common femoral vein and including the common femoral, femoral, profunda femoral, popliteal and calf veins including the posterior tibial, peroneal and gastrocnemius veins when visible. The superficial great saphenous vein was also interrogated. Spectral Doppler was utilized to evaluate flow at rest and with distal augmentation maneuvers in the common femoral, femoral and popliteal veins. COMPARISON:  None. FINDINGS: RIGHT LOWER EXTREMITY Common Femoral Vein: No evidence of thrombus. Normal compressibility, respiratory phasicity and response to augmentation. Saphenofemoral Junction: No evidence of thrombus. Normal compressibility and flow on color Doppler imaging. Profunda Femoral Vein: No evidence of thrombus. Normal compressibility and flow on color Doppler imaging. Femoral Vein: No evidence of thrombus. Normal compressibility, respiratory phasicity and response to augmentation. Popliteal Vein: No evidence of thrombus. Normal compressibility, respiratory phasicity and response to augmentation. Calf Veins: No evidence of thrombus. Normal compressibility and flow on color Doppler imaging. Superficial Great Saphenous Vein: No evidence of thrombus. Normal compressibility. Venous Reflux:  None. Other Findings: Better visualized on the current exam is a popliteal cyst medially. LEFT LOWER EXTREMITY Common Femoral Vein: No evidence of thrombus. Normal compressibility, respiratory phasicity and response to augmentation. Saphenofemoral Junction: No evidence of thrombus. Normal compressibility and flow on color Doppler imaging. Profunda Femoral Vein: No evidence of thrombus. Normal compressibility and flow on color Doppler imaging. Femoral Vein: No evidence of thrombus. Normal compressibility, respiratory phasicity and response to augmentation. Popliteal Vein: No evidence of  thrombus. Normal compressibility, respiratory phasicity and response to augmentation. Calf Veins: No evidence of thrombus. Normal compressibility and flow on color Doppler imaging. Superficial Great Saphenous Vein: No evidence of thrombus. Normal compressibility. Venous Reflux:  None. Other Findings: Better seen on the current exam is a popliteal cyst medially. IMPRESSION: No evidence of deep venous thrombosis bilaterally. Better visualized on today's exam are bilateral popliteal cysts Electronically Signed   By: Inez Catalina M.D.   On: 05/04/2018 14:31   US Venous Img Lower Bilateral  Result Date: 04/26/2018 CLINICAL DATA:  Bilateral lower extremity swelling EXAM: BILATERAL LOWER EXTREMITY VENOUS DOPPLER ULTRASOUND TECHNIQUE: Gray-scale sonography with graded compression, as well as color Doppler and duplex ultrasound were performed to evaluate the lower extremity deep venous systems from the level of the common femoral vein and including the common femoral, femoral, profunda femoral, popliteal and calf veins including the posterior tibial, peroneal and gastrocnemius veins when visible. The superficial great saphenous vein was also interrogated. Spectral Doppler was utilized to evaluate flow at rest and with distal augmentation maneuvers in the common femoral, femoral and popliteal veins. COMPARISON:  None. FINDINGS: RIGHT LOWER EXTREMITY Common Femoral Vein: No evidence of thrombus. Normal compressibility, respiratory phasicity and response to augmentation. Saphenofemoral Junction: No evidence of thrombus. Normal compressibility and flow on color Doppler imaging. Profunda Femoral Vein: No evidence of thrombus. Normal compressibility and flow on color Doppler imaging. Femoral Vein: No evidence of thrombus. Normal compressibility, respiratory phasicity and response to augmentation. Popliteal Vein: No evidence of thrombus. Normal compressibility, respiratory phasicity and response to augmentation. Calf Veins: No  evidence of thrombus. Normal compressibility and flow on color Doppler imaging. Superficial Great Saphenous Vein: No evidence of thrombus. Normal compressibility.  Venous Reflux:  None. Other Findings:  None. LEFT LOWER EXTREMITY Common Femoral Vein: No evidence of thrombus. Normal compressibility, respiratory phasicity and response to augmentation. Saphenofemoral Junction: No evidence of thrombus. Normal compressibility and flow on color Doppler imaging. Profunda Femoral Vein: No evidence of thrombus. Normal compressibility and flow on color Doppler imaging. Femoral Vein: No evidence of thrombus. Normal compressibility, respiratory phasicity and response to augmentation. Popliteal Vein: No evidence of thrombus. Normal compressibility, respiratory phasicity and response to augmentation. Calf Veins: No evidence of thrombus. Normal compressibility and flow on color Doppler imaging. Superficial Great Saphenous Vein: No evidence of thrombus. Normal compressibility. Venous Reflux:  None. Other Findings: Bilateral lower extremity subcutaneous edema noted. Bilateral peripheral atherosclerosis evident. IMPRESSION: No evidence of deep venous thrombosis. Electronically Signed   By: Jerilynn Mages.  Shick M.D.   On: 04/26/2018 15:05   Dg Chest Portable 1 View  Result Date: 05/03/2018 CLINICAL DATA:  Altered mental status.  Weakness. EXAM: PORTABLE CHEST 1 VIEW COMPARISON:  CT 04/26/2018, CXR 10/26/2012 FINDINGS: Redemonstration of a nodular density projecting over the right mid lung laterally. Mild emphysematous hyperinflation of the lungs with chronic interstitial change. No alveolar confluence, pulmonary edema, effusion or pneumothorax. Dextroconvex curvature of the mid to upper thoracic spine is redemonstrated. Heart and mediastinal contours are within normal limits with mild-to-moderate aortic atherosclerosis noted at the arch. No aneurysm is identified. No suspicious nor aggressive osseous lesions. IMPRESSION: 1. Emphysematous  hyperinflation of the lungs with peripheral right midlung nodular density noted as documented on recent CT. 2. Chronic interstitial change of the lungs without alveolar confluence, edema, effusion or pneumothorax. Electronically Signed   By: Ashley Royalty M.D.   On: 05/03/2018 20:13   Dg C-arm 1-60 Min-no Report  Result Date: 04/26/2018 Fluoroscopy was utilized by the requesting physician.  No radiographic interpretation.    PERFORMANCE STATUS (ECOG) : 2 - Symptomatic, <50% confined to bed  Review of Systems As noted above. Otherwise, a complete review of systems is negative.  Physical Exam General: NAD, frail appearing, thin, in chair Cardiovascular: regular rate and rhythm Pulmonary: clear ant fields Abdomen: soft, nontender, + bowel sounds GU: no suprapubic tenderness Extremities: Lower extremity edema, no joint deformities Skin: no rashes Neurological: Weakness but otherwise nonfocal  IMPRESSION: I met with patient today in the clinic.  He was accompanied by his daughter, who is a Marine scientist.  Patient met with Dr. Janese Banks today with plan to initiate treatment.  Patient was somewhat difficult to elicit answers to questions.  He mostly provided one-word responses.  Patient suggested that he was in a hurry to finish as he was hungry for lunch.  Agents daughter helps provide health history.  Patient reports feeling improved since returning home from the hospital.  At baseline, patient is ambulatory but has had weakness.  No falls reported.  He reports being mostly independent with activities of daily living, only occasionally requiring assistance.  However, he does have some difficulty standing from a seated position.  He has purchased a raised toilet and will have this installed.  His daughter would like to obtain a walker and wheelchair to have in the home as she anticipates the possibility of progressive weakness.  I reviewed with patient his wishes for CODE STATUS.  He confirms that he would  not want his life prolonged artificially nor to be resuscitated in the event of cardiopulmonary arrest.  He was a DNR while hospitalized and confirms that is still his intent today.  I completed a DNR order  for him to take home.  I discussed a most form today and will try to complete that at the next visit.  Symptomatically, patient denies pain, nausea/vomiting, and diarrhea/constipation.  Reports eating well.  We discussed the importance of oral intake with treatment plan.  He does endorse some insomnia.  He has tried melatonin and trazodone without good effect.  Note that Dr. Janese Banks gave her prescription today for Lorazepam, which patient reported was helpful while he was in the hospital.  PLAN: DNR form completed.  Will complete MOST form at next visit. DME including wheelchair and walker will be ordered. Agree with Lorazepam as needed RTC in 2 weeks  Patient expressed understanding and was in agreement with this plan. He also understands that He can call clinic at any time with any questions, concerns, or complaints.   Time Total: 15 minutes  Visit consisted of counseling and education dealing with the complex and emotionally intense issues of symptom management and palliative care in the setting of serious and potentially life-threatening illness.Greater than 50%  of this time was spent counseling and coordinating care related to the above assessment and plan.  Signed by: Altha Harm, [place], NP-C, [place] ([facility])

## 2018-05-09 NOTE — Progress Notes (Signed)
Nutrition Assessment   Reason for Assessment:   Referral weight loss   ASSESSMENT:   77 year old male with stage IV metastatic pancreatic cancer with lymph nodes and possible lung metastases. Past medical history of DM, COPD, COPD.  Noted recent hospital admission.  Met with patient and daughter today following MD appointment as add on.  Patient reports appetite is good, no problems.  Denies trouble with nausea, some diarrhea "awhile" ago but resolved, denies constipation.  Reports that he gets full quick. Denies abdominal pain effecting appetite.  Reports that he has been drinking glucerna.  Reports breakfast is usually oatmeal, or cereal or biscuit and sausage.  Usually skips lunch maybe snacks on cookies, candy.  Supper is usually sandwich, [employer] (meat and couple of veggies)  Blood glucose 20-400 per patient  Nutrition Focused Physical Exam: deferred today   Medications: colace, aspart, lantus, ativan, mag, KCL, vit B12   Labs: Na 130, K 3.2, glucose 131   Anthropometrics:   Height: 72 inches Weight: 136 lb 8 oz UBW: 150-155 lb in Feb/March of 2019 BMI: 18  9%-12% weight loss in the last 8 months   Estimated Energy Needs  Kcals: 1860-2170 calories Protein: 93-108 g/d Fluid: 2.1 L/d   NUTRITION DIAGNOSIS: Unintentional weight loss related to pancreatic cancer as evidenced by 9% weight loss in 8 months   INTERVENTION:  Discussed strategies to increase calories and protein. Fact sheet given. Recommend higher calorie oral nutrition supplement (350-360 calorie) at this time despite carbohydrate content vs glucerna (180 calorie). Would recommend adjusting blood glucose medications vs restricting diet.    MONITORING, EVALUATION, GOAL: weight trends, intake   Next Visit: October 24 during infusion  Bryan Hicks B. Zenia Resides, [place], [place] Registered Dietitian [phone] (pager)

## 2018-05-09 NOTE — Progress Notes (Signed)
Dark color urine / SOB at times / increase edema noted to bilateral feet and abdomen

## 2018-05-10 ENCOUNTER — Telehealth: Payer: Self-pay | Admitting: *Deleted

## 2018-05-10 ENCOUNTER — Telehealth: Payer: Self-pay

## 2018-05-10 ENCOUNTER — Other Ambulatory Visit (INDEPENDENT_AMBULATORY_CARE_PROVIDER_SITE_OTHER): Payer: Self-pay | Admitting: Nurse Practitioner

## 2018-05-10 ENCOUNTER — Encounter (INDEPENDENT_AMBULATORY_CARE_PROVIDER_SITE_OTHER): Payer: Self-pay

## 2018-05-10 NOTE — Telephone Encounter (Signed)
Called patient to let him know that I sent the prescription for compression stockings over the [facility]'s mastectomy and supplies.  At already given the phone number as well as the address to [facility] yesterday to him.  I told him he can go over at his leisure and they will measure him in order the stockings for him.  I also told him the only day that they could put his port and is next Thursday which is the same date of chemo.  So based on that were going to do the port on Thursday and then we will make his chemo appointment on Friday he said he will tell his daughter and will work from there please do not call back with all the appointments until 1-2 o'clock today per patient request thanks

## 2018-05-10 NOTE — Telephone Encounter (Signed)
The patient was approve from 05/09/18 - 05/10/19

## 2018-05-10 NOTE — Telephone Encounter (Signed)
Spoke with the patient to inform him that the PA was approved for the Lorazepam 0.5 # 30 and the pharmacy will have it ready today. The patient was understanding and agreeable to pick up medication from the pharmacy today.

## 2018-05-13 ENCOUNTER — Other Ambulatory Visit: Payer: Self-pay | Admitting: Oncology

## 2018-05-13 ENCOUNTER — Inpatient Hospital Stay: Payer: Medicare Other

## 2018-05-13 DIAGNOSIS — C259 Malignant neoplasm of pancreas, unspecified: Secondary | ICD-10-CM | POA: Insufficient documentation

## 2018-05-13 MED ORDER — LIDOCAINE-PRILOCAINE 2.5-2.5 % EX CREA: TOPICAL_CREAM | CUTANEOUS | 3 refills | Status: AC

## 2018-05-13 MED ORDER — ONDANSETRON HCL 8 MG PO TABS: 8.0000 mg | ORAL_TABLET | Freq: Two times a day (BID) | ORAL | 1 refills | Status: AC | PRN

## 2018-05-13 MED ORDER — PROCHLORPERAZINE MALEATE 10 MG PO TABS: 10.0000 mg | ORAL_TABLET | Freq: Four times a day (QID) | ORAL | 1 refills | Status: DC | PRN

## 2018-05-13 NOTE — Progress Notes (Signed)
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

## 2018-05-13 NOTE — Progress Notes (Signed)
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 During his recent appointment with primary care doctor he was found to have jaundice. On admission patient was noted to have a total bilirubin of 16 along with alkaline phosphatase of 1507 and elevated AST and ALT respectively. CBC showed white count of 12.4 and H&H of 9.1/25 with an MCV of 103 and a platelet count of 362. CT abdomen showed 2.2 x 2.9 cm mass in the pancreatic head concerning for pancreatic neoplasm. Double duct sign with intra-and extrahepatic ductal dilatation and dilatation of the pancreatic duct. Peripancreatic fluid collection versus necrotic lesions including a dominant 4.1 x 3.8 cm due to the pancreatic head with mass-effect in the distal bile duct. Moderate ascites. CT chest showed solitary 1.2 cm right upper lobe nodule-solitary metastases versus primary lung cancer.  Gastrohepatic ligament adenopathyCA-19-9 was elevated at 1988  Dr. Bunnie Philips ERCP with stent placement and biopies which showed adenocarcinoma which appears to be invading the duodenum and clinically from the pancreas.  Interval history-patient was admitted to the hospital last week with symptoms of altered mental status secondary to hypoglycemia.  His dose of Lantus was reduced and since then patient has not had any such episodes.  He reports  that his abdominal pain has been well controlled with oxycodone.  His main problem currently is sleeping at night.  He reports getting some Ativan in the hospital which helped him sleep.  ECOG PS- 2 Pain scale- 0 Opioid associated constipation- no  Review of systems- Review of Systems  Constitutional: Positive for malaise/fatigue and weight loss. Negative for chills and fever.  HENT: Negative for congestion, ear discharge and nosebleeds.   Eyes: Negative for blurred vision.  Respiratory: Negative for cough, hemoptysis, sputum production, shortness of breath and wheezing.   Cardiovascular: Negative for chest pain, palpitations, orthopnea and claudication.  Gastrointestinal: Negative for abdominal pain, blood in stool, constipation, diarrhea, heartburn, melena, nausea and vomiting.       Jaundice  Genitourinary: Negative for dysuria, flank pain, frequency, hematuria and urgency.  Musculoskeletal: Negative for back pain, joint pain and myalgias.  Skin: Negative for rash.  Neurological: Negative for dizziness, tingling, focal weakness, seizures, weakness and headaches.  Endo/Heme/Allergies: Does not bruise/bleed easily.  Psychiatric/Behavioral: Negative for depression and suicidal ideas. The patient does not have insomnia.        No Known Allergies   Past Medical History:  Diagnosis Date  . COPD (chronic obstructive pulmonary disease) ([place])   . Diabetes mellitus without complication ([place])   . Hypertension   . Shortness of breath dyspnea      Past Surgical History:  Procedure Laterality Date  . COLON SURGERY     "twisted intestine"  . ERCP N/A 04/26/2018   Procedure: ENDOSCOPIC RETROGRADE CHOLANGIOPANCREATOGRAPHY (ERCP);  Surgeon: Lucilla Lame, MD;  Location: [facility] ENDOSCOPY;  Service: Endoscopy;  Laterality: N/A;  . HERNIA MESH REMOVAL Right   . INGUINAL HERNIA REPAIR Left 12/04/2014  Procedure: HERNIA REPAIR INGUINAL ADULT;  Surgeon: Rochel Brome, MD;  Location: [facility] ORS;   Service: General;  Laterality: Left;    Social History   Socioeconomic History  . Marital status: Married    Spouse name: Not on file  . Number of children: Not on file  . Years of education: Not on file  . Highest education level: Not on file  Occupational History  . Not on file  Social Needs  . Financial resource strain: Not on file  . Food insecurity:    Worry: Patient refused    Inability: Patient refused  . Transportation needs:    Medical: Patient refused    Non-medical: Patient refused  Tobacco Use  . Smoking status: Current Every Day Smoker    Packs/day: 1.00    Years: 30.00    Pack years: 30.00  . Smokeless tobacco: Former Network engineer and Sexual Activity  . Alcohol use: No  . Drug use: No  . Sexual activity: Never  Lifestyle  . Physical activity:    Days per week: Patient refused    Minutes per session: Patient refused  . Stress: Not on file  Relationships  . Social connections:    Talks on phone: Patient refused    Gets together: Patient refused    Attends religious service: Patient refused    Active member of club or organization: Patient refused    Attends meetings of clubs or organizations: Patient refused    Relationship status: Patient refused  . Intimate partner violence:    Fear of current or ex partner: Patient refused    Emotionally abused: Patient refused    Physically abused: Patient refused    Forced sexual activity: Patient refused  Other Topics Concern  . Not on file  Social History Narrative  . Not on file    Family History  Problem Relation Age of Onset  . Colon cancer Mother   . Melanoma Father      Current Outpatient Medications:  .  Fluticasone-Salmeterol (ADVAIR) 250-50 MCG/DOSE AEPB, Inhale 1 puff into the lungs 2 (two) times daily. , Disp: , Rfl:  .  insulin aspart (NOVOLOG) 100 UNIT/ML injection, 5 units subcutaneous prior to meals if sugars greater than 150, Disp: 10 mL, Rfl: 11 .  LANTUS SOLOSTAR 100 UNIT/ML  Solostar Pen, 6 units suncutanous injection daily, Disp: 15 mL, Rfl: 12 .  levofloxacin (LEVAQUIN) 500 MG tablet, Take 1 tablet (500 mg total) by mouth daily., Disp: 5 tablet, Rfl: 0 .  magnesium chloride (SLOW-MAG) 64 MG TBEC SR tablet, Take 1 tablet by mouth daily., Disp: , Rfl:  .  potassium chloride SA (K-DUR,KLOR-CON) 20 MEQ tablet, Take 1 tablet (20 mEq total) by mouth daily., Disp: 30 tablet, Rfl: 0 .  vitamin B-12 (CYANOCOBALAMIN) 1000 MCG tablet, Take 1,000 mcg by mouth daily., Disp: , Rfl:  .  docusate sodium (COLACE) 100 MG capsule, Take 1 capsule (100 mg total) by mouth daily as needed for mild constipation. (Patient not taking: Reported on 05/09/2018), Disp: 60 capsule, Rfl: 0 .  LORazepam (ATIVAN) 0.5 MG tablet, Take 1 tablet (0.5 mg total) by mouth at bedtime as needed for anxiety., Disp: 30 tablet, Rfl: 0 .  Melatonin 5 MG TABS, Take 1 tablet (5 mg total) by mouth at bedtime. (Patient not taking: Reported on 05/09/2018), Disp: 30 tablet, Rfl: 0 .  oxyCODONE (OXY IR/ROXICODONE) 5 MG immediate release tablet, Take 1 tablet (5 mg total) by mouth every 8 (eight) hours as needed  for severe pain. (Patient not taking: Reported on 05/09/2018), Disp: 90 tablet, Rfl: 0 .  traZODone (DESYREL) 100 MG tablet, Take 1 tablet (100 mg total) by mouth at bedtime. (Patient not taking: Reported on 05/09/2018), Disp: 30 tablet, Rfl: 0  Physical exam:  Vitals:   05/09/18 1000  BP: 108/70  Pulse: (!) 116  Resp: 18  Temp: (!) 97.5 F (36.4 C)  TempSrc: Tympanic  Weight: 136 lb 8 oz (61.9 kg)  Height: 6' (1.829 m)   Physical Exam  Constitutional: He is oriented to person, place, and time.  Thin gentleman who appears fatigued  HENT:  Head: Normocephalic and atraumatic.  Sclerae icteric  Eyes: Pupils are equal, round, and reactive to light. EOM are normal.  Neck: Normal range of motion.  Cardiovascular: Normal rate, regular rhythm and normal heart sounds.  Pulmonary/Chest: Effort normal and  breath sounds normal.  Abdominal: Soft. Bowel sounds are normal.  Musculoskeletal: He exhibits edema (b/l+2).  Neurological: He is alert and oriented to person, place, and time.  Skin: Skin is warm and dry.     CMP Latest Ref Rng & Units 05/09/2018  Glucose 70 - 99 mg/dL 131(H)  BUN 8 - 23 mg/dL 6(L)  Creatinine 0.61 - 1.24 mg/dL 0.56(L)  Sodium 135 - 145 mmol/L 130(L)  Potassium 3.5 - 5.1 mmol/L 3.2(L)  Chloride 98 - 111 mmol/L 90(L)  CO2 22 - 32 mmol/L 30  Calcium 8.9 - 10.3 mg/dL 8.3(L)  Total Protein 6.5 - 8.1 g/dL 5.9(L)  Total Bilirubin 0.3 - 1.2 mg/dL 3.8(H)  Alkaline Phos 38 - 126 U/L 459(H)  AST 15 - 41 U/L 32  ALT 0 - 44 U/L 32   CBC Latest Ref Rng & Units 05/09/2018  WBC 4.0 - 10.5 K/uL 13.9(H)  Hemoglobin 13.0 - 17.0 g/dL 10.3(L)  Hematocrit 39.0 - 52.0 % 31.6(L)  Platelets 150 - 400 K/uL 509(H)    No images are attached to the encounter.  Ct Chest W Contrast  Result Date: 04/26/2018 CLINICAL DATA:  Inpatient. Pancreatic head mass with biliary and pancreatic duct dilation, jaundice, abdominal pain. Chest staging. EXAM: CT CHEST WITH CONTRAST TECHNIQUE: Multidetector CT imaging of the chest was performed during intravenous contrast administration. CONTRAST:  19mL OMNIPAQUE IOHEXOL 300 MG/ML  SOLN COMPARISON:  04/25/2018 CT abdomen/pelvis. 10/26/2012 chest radiograph. FINDINGS: Cardiovascular: Normal heart size. No significant pericardial effusion/thickening. Three-vessel coronary atherosclerosis. Atherosclerotic nonaneurysmal thoracic aorta. Dilated main pulmonary artery (3.5 cm diameter). No central pulmonary emboli. Mediastinum/Nodes: No discrete thyroid nodules. Unremarkable esophagus. No axillary adenopathy. Mildly enlarged 1.3 cm right hilar node (series 2/image 85). No left hilar adenopathy. No pathologically enlarged mediastinal nodes. Lungs/Pleura: No pneumothorax. Small dependent right pleural effusion. No left pleural effusion. Moderate to severe centrilobular  emphysema with diffuse bronchial wall thickening. Solid right upper lobe 1.2 cm pulmonary nodule (series 3/image 73). No lung masses or additional significant pulmonary nodules. Mild-to-moderate elevation of the right hemidiaphragm. Patchy somewhat bandlike consolidation and ground-glass opacity at the right lung base with associated volume loss. Upper abdomen: Small hiatal hernia. Re-demonstration prominent diffuse intrahepatic biliary ductal dilatation and pancreatic duct dilation, unchanged. Small to moderate volume upper abdominal ascites. Partial visualization of necrotic adenopathy in the gastrohepatic ligament, unchanged. Musculoskeletal: No aggressive appearing focal osseous lesions. Mild thoracic spondylosis. IMPRESSION: 1. Solitary 1.2 cm solid right upper lobe pulmonary nodule, malignancy not excluded, equivocal for solitary metastasis versus primary bronchogenic carcinoma. 2. Mild right hilar adenopathy, cannot exclude nodal metastatic disease. 3. Small dependent right pleural effusion.  4. Mild-to-moderate elevation of the right hemidiaphragm with patchy somewhat bandlike consolidation and ground-glass opacity at the right lung base with associated volume loss, favor atelectasis/scarring, cannot exclude a component of pneumonia. 5. Redemonstration of ascites, necrotic gastrohepatic ligament adenopathy and biliary and pancreatic dilatation in the upper abdomen, unchanged. 6. Dilated main pulmonary artery, suggesting pulmonary arterial hypertension. 7. Three-vessel coronary atherosclerosis. 8. Small hiatal hernia. Aortic Atherosclerosis (ICD10-I70.0) and Emphysema (ICD10-J43.9). Electronically Signed   By: Ilona Sorrel M.D.   On: 04/26/2018 11:19   Ct Abdomen Pelvis W Contrast  Result Date: 04/25/2018 CLINICAL DATA:  Acute abdominal pain, hypoglycemia EXAM: CT ABDOMEN AND PELVIS WITH CONTRAST TECHNIQUE: Multidetector CT imaging of the abdomen and pelvis was performed using the standard protocol  following bolus administration of intravenous contrast. CONTRAST:  114mL ISOVUE-300 IOPAMIDOL (ISOVUE-300) INJECTION 61% COMPARISON:  10/21/2012 FINDINGS: Lower chest: Mild right lower lobe atelectasis. Hepatobiliary: Liver is within normal limits. Moderate intrahepatic and extrahepatic ductal dilatation. Common duct measures up to 2.4 cm (coronal image 40) with extrinsic compression by a dominant cystic/necrotic lesion in the porta hepatitis. Gallbladder is unremarkable. No intrahepatic or extrahepatic ductal dilatation. Pancreas: 2.2 x 2.9 cm hypoenhancing lesion in the pancreatic head (series 2/image 36), highly suspicious for pancreatic neoplasm. Severe dilatation of the main pancreatic duct in the body/tail (series 2/image 27). Associated pancreatic atrophy. Peripancreatic fluid collections with enhancement along the anterior aspect of the pancreatic body/tail (series 2/image 27). Additional 4.1 x 3.8 cm cystic/necrotic lesion in the region of the pancreatic head, with mass effect on the common duct. Spleen: Within normal limits. Adrenals/Urinary Tract: Bilateral adrenal nodules, measuring up to 2.7 cm on the left,similar to 2014 and likely reflecting benign adrenal adenomas. Kidneys are within normal limits.  No hydronephrosis. Bladder is grossly unremarkable. Stomach/Bowel: Stomach is notable for a tiny hiatal hernia. Visualized bowel is grossly unremarkable. Appendix is not discretely visualized. Vascular/Lymphatic: No evidence of abdominal aortic aneurysm. No vascular involvement. Atherosclerotic calcifications of the abdominal aorta and branch vessels. No suspicious abdominopelvic lymphadenopathy. Reproductive: Prostate is grossly unremarkable. Other: Moderate abdominopelvic ascites. Postsurgical changes related to prior right ventral hernia mesh repair. Musculoskeletal: Grade 1 anterolisthesis at L5-S1. IMPRESSION: 2.2 x 2.9 cm hypoenhancing lesion in the pancreatic head, highly suspicious for pancreatic  neoplasm. Associated double duct sign with intrahepatic/extrahepatic ductal dilatation and dilatation of the pancreatic duct. Associated atrophy of the pancreatic body/tail. Associated peripancreatic fluid collections versus necrotic lesions, including a dominant 4.1 x 3.8 cm cystic lesion superior to the pancreatic head, with mass effect on the distal common duct. Moderate abdominopelvic ascites. Additional ancillary findings as above. Electronically Signed   By: Julian Hy M.D.   On: 04/25/2018 23:01   US Abdomen Limited  Result Date: 04/26/2018 CLINICAL DATA:  Pancreatic mass.  Moderate ascites described on CT EXAM: LIMITED ABDOMEN ULTRASOUND FOR ASCITES TECHNIQUE: Limited ultrasound survey for ascites was performed in all four abdominal quadrants. COMPARISON:  CT 04/25/2018 FINDINGS: There is a small amount of scattered abdominal ascites. No large pocket for safe paracentesis. IMPRESSION: Small amount of scattered abdominal ascites.  Paracentesis deferred. Electronically Signed   By: Lucrezia Europe M.D.   On: 04/26/2018 16:14   US Venous Img Lower Bilateral  Result Date: 05/04/2018 CLINICAL DATA:  Swelling and fevers EXAM: BILATERAL LOWER EXTREMITY VENOUS DOPPLER ULTRASOUND TECHNIQUE: Gray-scale sonography with graded compression, as well as color Doppler and duplex ultrasound were performed to evaluate the lower extremity deep venous systems from the level of the common femoral vein and  including the common femoral, femoral, profunda femoral, popliteal and calf veins including the posterior tibial, peroneal and gastrocnemius veins when visible. The superficial great saphenous vein was also interrogated. Spectral Doppler was utilized to evaluate flow at rest and with distal augmentation maneuvers in the common femoral, femoral and popliteal veins. COMPARISON:  None. FINDINGS: RIGHT LOWER EXTREMITY Common Femoral Vein: No evidence of thrombus. Normal compressibility, respiratory phasicity and response  to augmentation. Saphenofemoral Junction: No evidence of thrombus. Normal compressibility and flow on color Doppler imaging. Profunda Femoral Vein: No evidence of thrombus. Normal compressibility and flow on color Doppler imaging. Femoral Vein: No evidence of thrombus. Normal compressibility, respiratory phasicity and response to augmentation. Popliteal Vein: No evidence of thrombus. Normal compressibility, respiratory phasicity and response to augmentation. Calf Veins: No evidence of thrombus. Normal compressibility and flow on color Doppler imaging. Superficial Great Saphenous Vein: No evidence of thrombus. Normal compressibility. Venous Reflux:  None. Other Findings: Better visualized on the current exam is a popliteal cyst medially. LEFT LOWER EXTREMITY Common Femoral Vein: No evidence of thrombus. Normal compressibility, respiratory phasicity and response to augmentation. Saphenofemoral Junction: No evidence of thrombus. Normal compressibility and flow on color Doppler imaging. Profunda Femoral Vein: No evidence of thrombus. Normal compressibility and flow on color Doppler imaging. Femoral Vein: No evidence of thrombus. Normal compressibility, respiratory phasicity and response to augmentation. Popliteal Vein: No evidence of thrombus. Normal compressibility, respiratory phasicity and response to augmentation. Calf Veins: No evidence of thrombus. Normal compressibility and flow on color Doppler imaging. Superficial Great Saphenous Vein: No evidence of thrombus. Normal compressibility. Venous Reflux:  None. Other Findings: Better seen on the current exam is a popliteal cyst medially. IMPRESSION: No evidence of deep venous thrombosis bilaterally. Better visualized on today's exam are bilateral popliteal cysts Electronically Signed   By: Inez Catalina M.D.   On: 05/04/2018 14:31   US Venous Img Lower Bilateral  Result Date: 04/26/2018 CLINICAL DATA:  Bilateral lower extremity swelling EXAM: BILATERAL LOWER  EXTREMITY VENOUS DOPPLER ULTRASOUND TECHNIQUE: Gray-scale sonography with graded compression, as well as color Doppler and duplex ultrasound were performed to evaluate the lower extremity deep venous systems from the level of the common femoral vein and including the common femoral, femoral, profunda femoral, popliteal and calf veins including the posterior tibial, peroneal and gastrocnemius veins when visible. The superficial great saphenous vein was also interrogated. Spectral Doppler was utilized to evaluate flow at rest and with distal augmentation maneuvers in the common femoral, femoral and popliteal veins. COMPARISON:  None. FINDINGS: RIGHT LOWER EXTREMITY Common Femoral Vein: No evidence of thrombus. Normal compressibility, respiratory phasicity and response to augmentation. Saphenofemoral Junction: No evidence of thrombus. Normal compressibility and flow on color Doppler imaging. Profunda Femoral Vein: No evidence of thrombus. Normal compressibility and flow on color Doppler imaging. Femoral Vein: No evidence of thrombus. Normal compressibility, respiratory phasicity and response to augmentation. Popliteal Vein: No evidence of thrombus. Normal compressibility, respiratory phasicity and response to augmentation. Calf Veins: No evidence of thrombus. Normal compressibility and flow on color Doppler imaging. Superficial Great Saphenous Vein: No evidence of thrombus. Normal compressibility. Venous Reflux:  None. Other Findings:  None. LEFT LOWER EXTREMITY Common Femoral Vein: No evidence of thrombus. Normal compressibility, respiratory phasicity and response to augmentation. Saphenofemoral Junction: No evidence of thrombus. Normal compressibility and flow on color Doppler imaging. Profunda Femoral Vein: No evidence of thrombus. Normal compressibility and flow on color Doppler imaging. Femoral Vein: No evidence of thrombus. Normal compressibility, respiratory phasicity and response to  augmentation. Popliteal  Vein: No evidence of thrombus. Normal compressibility, respiratory phasicity and response to augmentation. Calf Veins: No evidence of thrombus. Normal compressibility and flow on color Doppler imaging. Superficial Great Saphenous Vein: No evidence of thrombus. Normal compressibility. Venous Reflux:  None. Other Findings: Bilateral lower extremity subcutaneous edema noted. Bilateral peripheral atherosclerosis evident. IMPRESSION: No evidence of deep venous thrombosis. Electronically Signed   By: Jerilynn Mages.  Shick M.D.   On: 04/26/2018 15:05   Dg Chest Portable 1 View  Result Date: 05/03/2018 CLINICAL DATA:  Altered mental status.  Weakness. EXAM: PORTABLE CHEST 1 VIEW COMPARISON:  CT 04/26/2018, CXR 10/26/2012 FINDINGS: Redemonstration of a nodular density projecting over the right mid lung laterally. Mild emphysematous hyperinflation of the lungs with chronic interstitial change. No alveolar confluence, pulmonary edema, effusion or pneumothorax. Dextroconvex curvature of the mid to upper thoracic spine is redemonstrated. Heart and mediastinal contours are within normal limits with mild-to-moderate aortic atherosclerosis noted at the arch. No aneurysm is identified. No suspicious nor aggressive osseous lesions. IMPRESSION: 1. Emphysematous hyperinflation of the lungs with peripheral right midlung nodular density noted as documented on recent CT. 2. Chronic interstitial change of the lungs without alveolar confluence, edema, effusion or pneumothorax. Electronically Signed   By: Ashley Royalty M.D.   On: 05/03/2018 20:13   Dg C-arm 1-60 Min-no Report  Result Date: 04/26/2018 Fluoroscopy was utilized by the requesting physician.  No radiographic interpretation.     Assessment and plan- Patient is a 77 y.o. male with stage IV pancreatic adenocarcinoma T2 N1 M1 with metastases to the lymph node and possibly the lung who to discuss further goals of care  Patient's bilirubin has further decreased to 3.8 from 6.2 with a  prior week.  He was recently hospitalized for episodes of hypoglycemia which has now been corrected after adjusting his Lantus dose.  Again discussed with patient and his daughter that he has stage IV pancreatic cancer and we could certainly consider chemotherapy to improve his quality of life and possibly longevity.  Chemotherapy in stage IV pancreatic cancer would not be curative and only palliative and would likely add a few months but not years to his longevity.  Patient would like to consider chemotherapy at this time.  I would recommend dose reduce gemcitabine 800 mg/m either 2 weeks on 1 week off for 1 week on 1 week based on his labs and tolerance.  Discussed risks and benefits of chemotherapy including all but not limited to nausea, vomiting, fatigue, low blood counts, risk of infections and hospitalizations.  Patient understands and agrees to proceed as planned.  We will plan to get a port placement and chemotherapy class and tentatively start chemo in 1 week's time.  I will hold off on adding Abraxane at this time unless his bilirubin normalizes but consider adding it down the line.  He will continue PRN oxycodone for his pain and I will prescribe Ativan 0.5 mg at night for his insomnia since he reports that this helped him sleep during his inpatient stay.  He will be meeting NP Vonna Kotyk borders from palliative care to discuss further goals of care  Patient suffers from stage IV pancreatic cancer which impairs his ability to perform his ADLs like toileting and grooming at home.  He is at risk of falls given his frailty and leg edema.  A cane or a walker will not resolve this issue with performing activities of daily living.  I do think that she had we will allow the patient  to safely perform his daily activities and he can propel the wheelchair at home or a caregiver can do so.  I therefore recommend a standard wheelchair for him   Total face to face encounter time for this patient visit was 40  min. >50% of the time was  spent in counseling and coordination of care.       Visit Diagnosis 1. Malignant neoplasm of pancreas, unspecified location of malignancy ([place])   2. Goals of care, counseling/discussion   3. Neoplasm related pain      Dr. Randa Evens, MD, MPH [facility] [phone] 05/13/2018 2:25 PM

## 2018-05-14 ENCOUNTER — Inpatient Hospital Stay: Payer: Medicare Other

## 2018-05-14 ENCOUNTER — Telehealth: Payer: Self-pay

## 2018-05-14 ENCOUNTER — Telehealth: Payer: Self-pay | Admitting: *Deleted

## 2018-05-14 LAB — BLOOD GAS, VENOUS
Acid-Base Excess: 4.2 mmol/L — ABNORMAL HIGH (ref 0.0–2.0)
Bicarbonate: 28.4 mmol/L — ABNORMAL HIGH (ref 20.0–28.0)
Patient temperature: 37
pCO2, Ven: 40 mmHg — ABNORMAL LOW (ref 44.0–60.0)
pH, Ven: 7.46 — ABNORMAL HIGH (ref 7.250–7.430)

## 2018-05-14 NOTE — Telephone Encounter (Signed)
Called Bryan Hicks today after finding out that his port will be placed on [address] and he will need to arrive [facility] the medical mall at 1215.  Nothing to eat or drink 6 hours prior.  He said Bryan Hicks from Dr. Fenton Foy office went over all this above.  Since he is getting his port placed this Thursday, we moved his chemotherapy appointment until Friday the 25th Bryan Hicks will arrive at 830 for labs then see the doctor then get treatment and after treatment he will be seen in the palliative care [place].  Bryan Hicks is agreeable to all above.  Also Bryan Hicks wanted to know how why the standard wheelchair that he will be getting.  I contacted Floydene Flock and [facility] and he said the width is 16 inches wide.

## 2018-05-15 ENCOUNTER — Telehealth: Payer: Self-pay

## 2018-05-15 NOTE — Telephone Encounter (Signed)
Called and notified daughter, Colletta [place], that prior authorization has been obtained for his Lorazepam and Emla cream. Both should now be available for pick up at his pharmacy. Oncology Nurse Navigator Documentation  Navigator Location: [facility] (05/15/18 0900)   )Navigator Encounter Type: Telephone (05/15/18 0900)                                                    Time Spent with Patient: 15 (05/15/18 0900)

## 2018-05-16 ENCOUNTER — Encounter
Admission: RE | Disposition: A | Discharge status: Home / Self Care | Payer: Self-pay | Source: Ambulatory Visit | Attending: Vascular Surgery

## 2018-05-16 ENCOUNTER — Ambulatory Visit: Payer: Medicare Other

## 2018-05-16 ENCOUNTER — Inpatient Hospital Stay: Payer: Medicare Other

## 2018-05-16 ENCOUNTER — Ambulatory Visit
Admission: RE | Admit: 2018-05-16 | Discharge: 2018-05-16 | Disposition: A | Discharge status: Home / Self Care | Payer: Medicare Other | Source: Ambulatory Visit | Attending: Vascular Surgery | Admitting: Vascular Surgery

## 2018-05-16 ENCOUNTER — Ambulatory Visit: Payer: Medicare Other | Admitting: Hospice and Palliative Medicine

## 2018-05-16 ENCOUNTER — Other Ambulatory Visit: Payer: Medicare Other

## 2018-05-16 ENCOUNTER — Ambulatory Visit: Payer: Medicare Other | Admitting: Oncology

## 2018-05-16 ENCOUNTER — Other Ambulatory Visit (INDEPENDENT_AMBULATORY_CARE_PROVIDER_SITE_OTHER): Payer: Self-pay | Admitting: Vascular Surgery

## 2018-05-16 DIAGNOSIS — E119 Type 2 diabetes mellitus without complications: Secondary | ICD-10-CM | POA: Diagnosis not present

## 2018-05-16 DIAGNOSIS — Z800 Family history of malignant neoplasm of digestive organs: Secondary | ICD-10-CM | POA: Diagnosis not present

## 2018-05-16 DIAGNOSIS — Z794 Long term (current) use of insulin: Secondary | ICD-10-CM | POA: Diagnosis not present

## 2018-05-16 DIAGNOSIS — R600 Localized edema: Secondary | ICD-10-CM | POA: Insufficient documentation

## 2018-05-16 DIAGNOSIS — G893 Neoplasm related pain (acute) (chronic): Secondary | ICD-10-CM | POA: Diagnosis not present

## 2018-05-16 DIAGNOSIS — R5383 Other fatigue: Secondary | ICD-10-CM | POA: Diagnosis not present

## 2018-05-16 DIAGNOSIS — I10 Essential (primary) hypertension: Secondary | ICD-10-CM | POA: Diagnosis not present

## 2018-05-16 DIAGNOSIS — Z79899 Other long term (current) drug therapy: Secondary | ICD-10-CM | POA: Diagnosis not present

## 2018-05-16 DIAGNOSIS — R634 Abnormal weight loss: Secondary | ICD-10-CM | POA: Insufficient documentation

## 2018-05-16 DIAGNOSIS — J449 Chronic obstructive pulmonary disease, unspecified: Secondary | ICD-10-CM | POA: Insufficient documentation

## 2018-05-16 DIAGNOSIS — Z808 Family history of malignant neoplasm of other organs or systems: Secondary | ICD-10-CM | POA: Diagnosis not present

## 2018-05-16 DIAGNOSIS — F17210 Nicotine dependence, cigarettes, uncomplicated: Secondary | ICD-10-CM | POA: Insufficient documentation

## 2018-05-16 DIAGNOSIS — Z9889 Other specified postprocedural states: Secondary | ICD-10-CM | POA: Insufficient documentation

## 2018-05-16 DIAGNOSIS — R0602 Shortness of breath: Secondary | ICD-10-CM | POA: Diagnosis not present

## 2018-05-16 DIAGNOSIS — C259 Malignant neoplasm of pancreas, unspecified: Secondary | ICD-10-CM | POA: Diagnosis not present

## 2018-05-16 HISTORY — PX: PORTA CATH INSERTION: CATH118285

## 2018-05-16 LAB — GLUCOSE, CAPILLARY
Glucose-Capillary: 101 mg/dL — ABNORMAL HIGH (ref 70–99)
Glucose-Capillary: 71 mg/dL (ref 70–99)

## 2018-05-16 SURGERY — PORTA CATH INSERTION
Anesthesia: Moderate Sedation

## 2018-05-16 MED ORDER — DEXTROSE 5 % IV SOLN: 2.0000 g | Freq: Once | INTRAVENOUS | Status: DC

## 2018-05-16 MED ORDER — SODIUM CHLORIDE 0.9 % IV SOLN
INTRAVENOUS | Status: DC
  Administered 2018-05-16: 13:00:00 via INTRAVENOUS

## 2018-05-16 MED ORDER — SODIUM CHLORIDE 0.9 % IV SOLN
Freq: Once | INTRAVENOUS | Status: AC
  Administered 2018-05-16: 14:00:00
  Filled 2018-05-16: qty 80

## 2018-05-16 MED ORDER — FENTANYL CITRATE (PF) 100 MCG/2ML IJ SOLN
INTRAMUSCULAR | Status: AC
  Filled 2018-05-16: qty 2

## 2018-05-16 MED ORDER — ONDANSETRON HCL 4 MG/2ML IJ SOLN: 4.0000 mg | Freq: Four times a day (QID) | INTRAMUSCULAR | Status: DC | PRN

## 2018-05-16 MED ORDER — MIDAZOLAM HCL 5 MG/5ML IJ SOLN
INTRAMUSCULAR | Status: AC
  Filled 2018-05-16: qty 5

## 2018-05-16 MED ORDER — HYDROMORPHONE HCL 1 MG/ML IJ SOLN: 1.0000 mg | Freq: Once | INTRAMUSCULAR | Status: DC | PRN

## 2018-05-16 MED ORDER — DEXTROSE 5 % IV SOLN
2.0000 g | Freq: Once | INTRAVENOUS | Status: DC
  Administered 2018-05-16: 2 g via INTRAVENOUS

## 2018-05-16 MED ORDER — HEPARIN (PORCINE) IN NACL 1000-0.9 UT/500ML-% IV SOLN
INTRAVENOUS | Status: AC
  Filled 2018-05-16: qty 500

## 2018-05-16 MED ORDER — LIDOCAINE-EPINEPHRINE (PF) 1 %-1:200000 IJ SOLN
INTRAMUSCULAR | Status: AC
  Filled 2018-05-16: qty 30

## 2018-05-16 MED ORDER — FENTANYL CITRATE (PF) 100 MCG/2ML IJ SOLN
INTRAMUSCULAR | Status: DC | PRN
  Administered 2018-05-16: 50 ug via INTRAVENOUS
  Administered 2018-05-16 (×2): 25 ug via INTRAVENOUS

## 2018-05-16 MED ORDER — CEFAZOLIN SODIUM-DEXTROSE 2-4 GM/100ML-% IV SOLN: 2.0000 g | Freq: Once | INTRAVENOUS | Status: DC

## 2018-05-16 MED ORDER — MIDAZOLAM HCL 2 MG/2ML IJ SOLN
INTRAMUSCULAR | Status: DC | PRN
  Administered 2018-05-16: 2 mg via INTRAVENOUS
  Administered 2018-05-16 (×2): 1 mg via INTRAVENOUS

## 2018-05-16 SURGICAL SUPPLY — 10 items
DERMABOND ADVANCED (GAUZE/BANDAGES/DRESSINGS) ×2
DERMABOND ADVANCED .7 DNX12 (GAUZE/BANDAGES/DRESSINGS) ×1 IMPLANT
KIT PORT POWER 8FR ISP CVUE (Port) ×3 IMPLANT
PACK ANGIOGRAPHY (CUSTOM PROCEDURE TRAY) ×3 IMPLANT
PAD GROUND ADULT SPLIT (MISCELLANEOUS) ×3 IMPLANT
PENCIL ELECTRO HAND CTR (MISCELLANEOUS) ×3 IMPLANT
SUT MNCRL AB 4-0 PS2 18 (SUTURE) ×3 IMPLANT
SUT PROLENE 0 CT 1 30 (SUTURE) ×3 IMPLANT
SUT VIC AB 3-0 SH 27 (SUTURE) ×2
SUT VIC AB 3-0 SH 27X BRD (SUTURE) ×1 IMPLANT

## 2018-05-16 NOTE — Progress Notes (Signed)
Patient post port placement per Dr Lucky Cowboy, vitals stable. Wife's daughter with patient and at bedside. Eating sandwich at this time. Denies complaints.

## 2018-05-16 NOTE — Op Note (Signed)
      [facility]       Operative Note  Date: 05/16/2018  Preoperative diagnosis:  1. Pancreatic cancer  Postoperative diagnosis:  Same as above  Procedures: #1. Ultrasound guidance for vascular access to the right internal jugular vein. #2. Fluoroscopic guidance for placement of catheter. #3. Placement of CT compatible Port-A-Cath, right internal jugular vein.  Surgeon: Leotis Pain, MD.   Assistant: Hezzie Bump, PA-C  Anesthesia: Local with moderate conscious sedation for approximately 20  minutes using 4 mg of Versed and 100 mcg of Fentanyl  Fluoroscopy time: less than 1 minute  Contrast used: 0  Estimated blood loss: 5 cc  Indication for the procedure:  The patient is a 77 y.o.male with pancreas cancer.  The patient needs a Port-A-Cath for durable venous access, chemotherapy, lab draws, and CT scans. We are asked to place this. Risks and benefits were discussed and informed consent was obtained.  Description of procedure: The patient was brought to the vascular and interventional radiology suite.  Moderate conscious sedation was administered throughout the procedure during a face to face encounter with the patient with my supervision of the RN administering medicines and monitoring the patient's vital signs, pulse oximetry, telemetry and mental status throughout from the start of the procedure until the patient was taken to the recovery room. The right neck chest and shoulder were sterilely prepped and draped, and a sterile surgical field was created. Ultrasound was used to help visualize a patent right internal jugular vein. This was then accessed under direct ultrasound guidance without difficulty with the Seldinger needle and a permanent image was recorded. A J-wire was placed. After skin nick and dilatation, the peel-away sheath was then placed over the wire. I then anesthetized an area under the clavicle approximately 1-2 fingerbreadths. A transverse  incision was created and an inferior pocket was created with electrocautery and blunt dissection. The port was then brought onto the field, placed into the pocket and secured to the chest wall with 2 Prolene sutures. The catheter was connected to the port and tunneled from the subclavicular incision to the access site. Fluoroscopic guidance was then used to cut the catheter to an appropriate length. The catheter was then placed through the peel-away sheath and the peel-away sheath was removed. The catheter tip was parked in excellent location under fluorocoscopic guidance in the cavoatrial junction. The pocket was then irrigated with antibiotic impregnated saline and the wound was closed with a running 3-0 Vicryl and a 4-0 Monocryl. The access incision was closed with a single 4-0 Monocryl. The Huber needle was used to withdraw blood and flush the port with heparinized saline. Dermabond was then placed as a dressing. The patient tolerated the procedure well and was taken to the recovery room in stable condition.   Leotis Pain 05/16/2018 2:58 PM   This note was created with Dragon Medical transcription system. Any errors in dictation are purely unintentional.

## 2018-05-16 NOTE — Progress Notes (Signed)
Nutrition  Nutrition follow-up appointment was scheduled for today during infusion but patient did not show up.  Noted port placement today and all other appointments have been moved to Friday, October 25. RD not in clinic on Fridays.  RD will try and reach patient via phone at later date.    Bali Lyn B. Zenia Resides, [place], [place] Registered Dietitian [phone] (pager)

## 2018-05-16 NOTE — H&P (Signed)
[facility] History & Physical Update  The patient was interviewed and re-examined.  The patient's previous History and Physical has been reviewed and is unchanged.  There is no change in the plan of care. We plan to proceed with the scheduled procedure.  Leotis Pain, MD  05/16/2018, 12:06 PM

## 2018-05-16 NOTE — Discharge Instructions (Signed)
Moderate Conscious Sedation, Adult, Care After °These instructions provide you with information about caring for yourself after your procedure. Your health care provider may also give you more specific instructions. Your treatment has been planned according to current medical practices, but problems sometimes occur. Call your health care provider if you have any problems or questions after your procedure. °What can I expect after the procedure? °After your procedure, it is common: °· To feel sleepy for several hours. °· To feel clumsy and have poor balance for several hours. °· To have poor judgment for several hours. °· To vomit if you eat too soon. ° °Follow these instructions at home: °For at least 24 hours after the procedure: ° °· Do not: °? Participate in activities where you could fall or become injured. °? Drive. °? Use heavy machinery. °? Drink alcohol. °? Take sleeping pills or medicines that cause drowsiness. °? Make important decisions or sign legal documents. °? Take care of children on your own. °· Rest. °Eating and drinking °· Follow the diet recommended by your health care provider. °· If you vomit: °? Drink water, juice, or soup when you can drink without vomiting. °? Make sure you have little or no nausea before eating solid foods. °General instructions °· Have a responsible adult stay with you until you are awake and alert. °· Take over-the-counter and prescription medicines only as told by your health care provider. °· If you smoke, do not smoke without supervision. °· Keep all follow-up visits as told by your health care provider. This is important. °Contact a health care provider if: °· You keep feeling nauseous or you keep vomiting. °· You feel light-headed. °· You develop a rash. °· You have a fever. °Get help right away if: °· You have trouble breathing. °This information is not intended to replace advice given to you by your health care provider. Make sure you discuss any questions you have  with your health care provider. °Document Released: 04/30/2013 Document Revised: 12/13/2015 Document Reviewed: 10/30/2015 °Elsevier Interactive Patient Education © 2018 [employer] °Tunneled Catheter Insertion, Care After °Refer to this sheet in the next few weeks. These instructions provide you with information about caring for yourself after your procedure. Your health care provider may also give you more specific instructions. Your treatment has been planned according to current medical practices, but problems sometimes occur. Call your health care provider if you have any problems or questions after your procedure. °What can I expect after the procedure? °After the procedure, it is common to have: °· Some mild redness, swelling, and pain around your catheter site. °· A small amount of blood or clear fluid coming from your incisions. ° °Follow these instructions at home: °Incision care °· Check your incision areas every day for signs of infection. Check for: °? More redness, swelling, or pain. °? More fluid or blood. °? Warmth. °? Pus or a bad smell. °· Follow instructions from your health care provider about how to take care of your incisions. Make sure you: °? Wash your hands with soap and water before you change your bandages (dressings). If soap and water are not available, use hand sanitizer. °? Change your dressings as told by your health care provider. Wash the area around your incisions with a germ-killing (antiseptic) solution when you change your dressing, as told by your health care provider. °? Leave stitches (sutures), skin glue, or adhesive strips in place. These skin closures may need to stay in place for 2 weeks or   longer. If adhesive strip edges start to loosen and curl up, you may trim the loose edges. Do not remove adhesive strips completely unless your health care provider tells you to do that. °Catheter Care ° °· Wash your hands with soap and water before and after caring for your catheter.  If soap and water are not available, use hand sanitizer. °· Keep your catheter site and your dressings clean and dry. °· Apply an antibiotic ointment to your catheter site as told by your health care provider. °· Flush your catheter as told by your health care provider. This helps prevent it from becoming clogged. °· Do not open the caps on the ends of the catheter. °· Do not pull on your catheter. °· If your catheter is in your arm: °? Avoid wearing tight clothes or tight jewelry on your arm that has the catheter. °? Do not sleep with your head on the arm that has the catheter. °? Do not allow your blood pressure to be taken on the arm that has the catheter. °? Do not allow your blood to be drawn from the arm that has the catheter, except through the catheter itself. °Medicines °· Take over-the-counter and prescription medicines only as told by your health care provider. °· If you were prescribed an antibiotic medicine, take it as told by your health care provider. Do not stop taking the antibiotic even if you start to feel better. °Activity °· Return to your normal activities as told by your health care provider. Ask your health care provider what activities are safe for you. °· Do not lift anything that is heavier than 10 lb (4.5 kg) for 3 weeks or as long as told by your health care provider. °Driving °· Do not drive until your health care provider approves. °· Do not drive or operate heavy machinery while taking prescription pain medicine. °General instructions °· Follow your health care provider's specific instructions for the type of catheter that you have. °· Do not take baths, swim, or use a hot tub until your health care provider approves. °· Follow instructions from your health care provider about eating or drinking restrictions. °· Wear compression stockings as told by your health care provider. These stockings help to prevent blood clots and reduce swelling in your legs. °· Keep all follow-up visits as  told by your health care provider. This is important. °Contact a health care provider if: °· You have more fluid or blood coming from your incisions. °· You have more redness, swelling, or pain at your incisions or around the area where your catheter is inserted. °· Your incisions feel warm to the touch. °· You feel unusually weak. °· You feel nauseous. °· Your catheter is not working properly. °· You have blood or fluid draining from your catheter. °· You are unable to flush your catheter. °Get help right away if: °· Your catheter breaks. °· A hole develops in your catheter. °· Your catheter comes loose or gets pulled completely out. If this happens, press on your catheter site firmly with your hand or a clean cloth until you get medical help. °· Your catheter becomes blocked. °· You have swelling in your arm, shoulder, neck, or face. °· You develop chest pain. °· You have difficulty breathing. °· You feel dizzy or light-headed. °· You have pus or a bad smell coming from your incisions. °· You have a fever. °· You develop bleeding from your catheter or your insertion site, and your bleeding does not   stop. °This information is not intended to replace advice given to you by your health care provider. Make sure you discuss any questions you have with your health care provider. °Document Released: 06/26/2012 Document Revised: 03/12/2016 Document Reviewed: 04/05/2015 °Elsevier Interactive Patient Education © 2018 [employer] ° °

## 2018-05-17 ENCOUNTER — Inpatient Hospital Stay: Payer: Medicare Other | Admitting: Hospice and Palliative Medicine

## 2018-05-17 ENCOUNTER — Encounter: Payer: Self-pay | Admitting: Vascular Surgery

## 2018-05-17 ENCOUNTER — Inpatient Hospital Stay: Payer: Medicare Other

## 2018-05-17 ENCOUNTER — Other Ambulatory Visit: Payer: Self-pay | Admitting: *Deleted

## 2018-05-17 ENCOUNTER — Other Ambulatory Visit: Payer: Self-pay

## 2018-05-17 ENCOUNTER — Inpatient Hospital Stay (HOSPITAL_BASED_OUTPATIENT_CLINIC_OR_DEPARTMENT_OTHER): Payer: Medicare Other | Admitting: Oncology

## 2018-05-17 VITALS — BP 124/65 | HR 80 | Temp 97.3°F | Resp 18 | Ht 72.0 in | Wt 126.1 lb

## 2018-05-17 DIAGNOSIS — G893 Neoplasm related pain (acute) (chronic): Secondary | ICD-10-CM

## 2018-05-17 DIAGNOSIS — Z515 Encounter for palliative care: Secondary | ICD-10-CM | POA: Insufficient documentation

## 2018-05-17 DIAGNOSIS — C259 Malignant neoplasm of pancreas, unspecified: Secondary | ICD-10-CM

## 2018-05-17 DIAGNOSIS — C779 Secondary and unspecified malignant neoplasm of lymph node, unspecified: Secondary | ICD-10-CM

## 2018-05-17 DIAGNOSIS — Z5111 Encounter for antineoplastic chemotherapy: Secondary | ICD-10-CM | POA: Insufficient documentation

## 2018-05-17 DIAGNOSIS — K831 Obstruction of bile duct: Secondary | ICD-10-CM

## 2018-05-17 DIAGNOSIS — F17210 Nicotine dependence, cigarettes, uncomplicated: Secondary | ICD-10-CM | POA: Diagnosis not present

## 2018-05-17 LAB — VITAMIN B12: Vitamin B-12: 6531 pg/mL — ABNORMAL HIGH (ref 180–914)

## 2018-05-17 LAB — IRON AND TIBC
IRON: 16 ug/dL — AB (ref 45–182)
Saturation Ratios: 7 % — ABNORMAL LOW (ref 17.9–39.5)
TIBC: 219 ug/dL — ABNORMAL LOW (ref 250–450)
UIBC: 203 ug/dL

## 2018-05-17 LAB — CBC WITH DIFFERENTIAL/PLATELET
Abs Immature Granulocytes: 0.06 10*3/uL (ref 0.00–0.07)
BASOS ABS: 0.1 10*3/uL (ref 0.0–0.1)
BASOS PCT: 1 %
EOS ABS: 0.1 10*3/uL (ref 0.0–0.5)
EOS PCT: 1 %
HCT: 30.7 % — ABNORMAL LOW (ref 39.0–52.0)
Hemoglobin: 10.2 g/dL — ABNORMAL LOW (ref 13.0–17.0)
Immature Granulocytes: 1 %
Lymphocytes Relative: 16 %
Lymphs Abs: 1.6 10*3/uL (ref 0.7–4.0)
MCH: 32.3 pg (ref 26.0–34.0)
MCHC: 33.2 g/dL (ref 30.0–36.0)
MCV: 97.2 fL (ref 80.0–100.0)
Monocytes Absolute: 1.3 10*3/uL — ABNORMAL HIGH (ref 0.1–1.0)
Monocytes Relative: 13 %
NRBC: 0 % (ref 0.0–0.2)
Neutro Abs: 6.8 10*3/uL (ref 1.7–7.7)
Neutrophils Relative %: 68 %
PLATELETS: 424 10*3/uL — AB (ref 150–400)
RBC: 3.16 MIL/uL — AB (ref 4.22–5.81)
RDW: 14.6 % (ref 11.5–15.5)
WBC Morphology: REACTIVE
WBC: 9.8 10*3/uL (ref 4.0–10.5)

## 2018-05-17 LAB — COMPREHENSIVE METABOLIC PANEL
ALT: 15 U/L (ref 0–44)
AST: 24 U/L (ref 15–41)
Albumin: 2.5 g/dL — ABNORMAL LOW (ref 3.5–5.0)
Alkaline Phosphatase: 258 U/L — ABNORMAL HIGH (ref 38–126)
Anion gap: 8 (ref 5–15)
BUN: 11 mg/dL (ref 8–23)
CALCIUM: 8.4 mg/dL — AB (ref 8.9–10.3)
CHLORIDE: 92 mmol/L — AB (ref 98–111)
CO2: 32 mmol/L (ref 22–32)
Creatinine, Ser: 0.45 mg/dL — ABNORMAL LOW (ref 0.61–1.24)
Glucose, Bld: 78 mg/dL (ref 70–99)
Potassium: 3 mmol/L — ABNORMAL LOW (ref 3.5–5.1)
SODIUM: 132 mmol/L — AB (ref 135–145)
Total Bilirubin: 2.7 mg/dL — ABNORMAL HIGH (ref 0.3–1.2)
Total Protein: 6.1 g/dL — ABNORMAL LOW (ref 6.5–8.1)

## 2018-05-17 LAB — FOLATE: FOLATE: 8.4 ng/mL (ref >5.9)

## 2018-05-17 LAB — FERRITIN: Ferritin: 313 ng/mL (ref 24–336)

## 2018-05-17 MED ORDER — SODIUM CHLORIDE 0.9 % IV SOLN
1400.0000 mg | Freq: Once | INTRAVENOUS | Status: AC
  Administered 2018-05-17: 1400 mg via INTRAVENOUS
  Filled 2018-05-17: qty 37

## 2018-05-17 MED ORDER — PROCHLORPERAZINE MALEATE 10 MG PO TABS
10.0000 mg | ORAL_TABLET | Freq: Once | ORAL | Status: AC
  Administered 2018-05-17: 10 mg via ORAL
  Filled 2018-05-17: qty 1

## 2018-05-17 MED ORDER — HEPARIN SOD (PORK) LOCK FLUSH 100 UNIT/ML IV SOLN
500.0000 [IU] | Freq: Once | INTRAVENOUS | Status: AC | PRN
  Administered 2018-05-17: 500 [IU]
  Filled 2018-05-17: qty 5

## 2018-05-17 MED ORDER — SODIUM CHLORIDE 0.9% FLUSH
10.0000 mL | INTRAVENOUS | Status: AC | PRN
  Filled 2018-05-17: qty 10

## 2018-05-17 MED ORDER — SODIUM CHLORIDE 0.9 % IV SOLN
Freq: Once | INTRAVENOUS | Status: AC
  Administered 2018-05-17: 12:00:00 via INTRAVENOUS
  Filled 2018-05-17: qty 250

## 2018-05-17 NOTE — Progress Notes (Signed)
No new changes noted today 

## 2018-05-17 NOTE — Progress Notes (Signed)
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 During his recent appointment with primary care doctor he was found to have jaundice. On admission patient was noted to have a total bilirubin of 16 along with alkaline phosphatase of 1507 and elevated AST and ALT respectively. CBC showed white count of 12.4 and H&H of 9.1/25 with an MCV of 103 and a platelet count of 362. CT abdomen showed 2.2 x 2.9 cm mass in the pancreatic head concerning for pancreatic neoplasm. Double duct sign with intra-and extrahepatic ductal dilatation and dilatation of the pancreatic duct. Peripancreatic fluid collection versus necrotic lesions including a dominant 4.1 x 3.8 cm due to the pancreatic head with mass-effect in the distal bile duct. Moderate ascites. CT chest showed solitary 1.2 cm right upper lobe nodule-solitary metastases versus primary lung cancer.Gastrohepatic ligament adenopathyCA-19-9was elevated at 1988  Dr. Bunnie Philips ERCP with stent placement and biopieswhich showed adenocarcinoma which appears to be invading the duodenum and clinically from the pancreas.  Interval history-abdominal pain is relatively well controlled and he is taking 1 to 2 tablets a day.  He uses Ativan and oxycodone at night to help him sleep.  Appetite is fair.  He has lost 10 more pounds as  compared to last week.  ECOG PS- 2 Pain scale- 0 Opioid associated constipation- no  Review of systems- Review of Systems  Constitutional: Positive for malaise/fatigue and weight loss. Negative for chills and fever.  HENT: Negative for congestion, ear discharge and nosebleeds.   Eyes: Negative for blurred vision.  Respiratory: Negative for cough, hemoptysis, sputum production, shortness of breath and wheezing.   Cardiovascular: Negative for chest pain, palpitations, orthopnea and claudication.  Gastrointestinal: Negative for abdominal pain, blood in stool, constipation, diarrhea, heartburn, melena, nausea and vomiting.  Genitourinary: Negative for dysuria, flank pain, frequency, hematuria and urgency.  Musculoskeletal: Negative for back pain, joint pain and myalgias.  Skin: Negative for rash.  Neurological: Negative for dizziness, tingling, focal weakness, seizures, weakness and headaches.  Endo/Heme/Allergies: Does not bruise/bleed easily.  Psychiatric/Behavioral: Negative for depression and suicidal ideas. The patient does not have insomnia.        No Known Allergies   Past Medical History:  Diagnosis Date  . COPD (chronic obstructive pulmonary disease) ([place])   . Diabetes mellitus without complication ([place])   . Hypertension   . Shortness of breath dyspnea      Past Surgical History:  Procedure Laterality Date  . COLON SURGERY     "twisted intestine"  . ERCP N/A 04/26/2018   Procedure: ENDOSCOPIC RETROGRADE CHOLANGIOPANCREATOGRAPHY (ERCP);  Surgeon: Lucilla Lame, MD;  Location: [facility];  Service: Endoscopy;  Laterality: N/A;  . HERNIA MESH REMOVAL Right   . INGUINAL HERNIA REPAIR Left 12/04/2014   Procedure: HERNIA REPAIR INGUINAL ADULT;  Surgeon: Rochel Brome, MD;  Location: [facility] ORS;  Service: General;  Laterality: Left;  . PORTA CATH INSERTION N/A 05/16/2018   Procedure: PORTA CATH INSERTION;  Surgeon: Algernon Huxley, MD;  Location: [facility];  Service:  Cardiovascular;  Laterality: N/A;    Social History   Socioeconomic History  . Marital status: Married    Spouse name: Not on file  . Number of children: Not on file  . Years of education: Not on file  . Highest education level: Not on file  Occupational History  . Not on file  Social Needs  . Financial resource strain: Not on file  . Food insecurity:    Worry: Patient refused    Inability: Patient refused  . Transportation needs:    Medical: Patient refused    Non-medical: Patient refused  Tobacco Use  . Smoking status: Current Every Day Smoker    Packs/day: 1.00    Years: 30.00    Pack years: 30.00  . Smokeless tobacco: Former Network engineer and Sexual Activity  . Alcohol use: No  . Drug use: No  . Sexual activity: Never  Lifestyle  . Physical activity:    Days per week: Patient refused    Minutes per session: Patient refused  . Stress: Not on file  Relationships  . Social connections:    Talks on phone: Patient refused    Gets together: Patient refused    Attends religious service: Patient refused    Active member of club or organization: Patient refused    Attends meetings of clubs or organizations: Patient refused    Relationship status: Patient refused  . Intimate partner violence:    Fear of current or ex partner: Patient refused    Emotionally abused: Patient refused    Physically abused: Patient refused    Forced sexual activity: Patient refused  Other Topics Concern  . Not on file  Social History Narrative  . Not on file    Family History  Problem Relation Age of Onset  . Colon cancer Mother   . Melanoma Father      Current Outpatient Medications:  .  Fluticasone-Salmeterol (ADVAIR) 250-50 MCG/DOSE AEPB, Inhale 1 puff into the lungs 2 (two) times daily. , Disp: , Rfl:  .  insulin aspart (NOVOLOG) 100 UNIT/ML injection, 5 units subcutaneous prior to meals if sugars greater than 150, Disp: 10 mL, Rfl: 11 .  LANTUS SOLOSTAR 100 UNIT/ML Solostar  Pen, 6 units suncutanous injection daily, Disp: 15 mL, Rfl: 12 .  magnesium chloride (SLOW-MAG) 64 MG TBEC SR tablet, Take 1 tablet by mouth daily., Disp: , Rfl:  .  potassium chloride SA (K-DUR,KLOR-CON) 20 MEQ tablet, Take 1 tablet (20 mEq total) by mouth daily., Disp: 30 tablet, Rfl: 0 .  vitamin B-12 (CYANOCOBALAMIN) 1000 MCG tablet, Take 1,000 mcg by mouth daily., Disp: , Rfl:  .  docusate sodium (COLACE) 100 MG capsule, Take 1 capsule (100 mg total) by mouth daily as needed for mild constipation. (Patient not taking: Reported on 05/09/2018), Disp: 60 capsule, Rfl: 0 .  levofloxacin (LEVAQUIN) 500 MG tablet, Take 1 tablet (500 mg total) by mouth daily. (Patient not taking: Reported on 05/17/2018), Disp: 5 tablet, Rfl: 0 .  lidocaine-prilocaine (EMLA) cream, Apply to affected area once (Patient not taking: Reported on 05/17/2018), Disp: 30 g, Rfl: 3 .  LORazepam (ATIVAN) 0.5 MG tablet, Take 1 tablet (0.5 mg total) by mouth at bedtime as needed for anxiety. (Patient not taking: Reported on 05/17/2018), Disp: 30 tablet, Rfl: 0 .  Melatonin 5 MG TABS, Take 1 tablet (5 mg total) by mouth at bedtime. (Patient not taking: Reported on  05/09/2018), Disp: 30 tablet, Rfl: 0 .  ondansetron (ZOFRAN) 8 MG tablet, Take 1 tablet (8 mg total) by mouth 2 (two) times daily as needed (Nausea or vomiting). (Patient not taking: Reported on 05/17/2018), Disp: 30 tablet, Rfl: 1 .  oxyCODONE (OXY IR/ROXICODONE) 5 MG immediate release tablet, Take 1 tablet (5 mg total) by mouth every 8 (eight) hours as needed for severe pain. (Patient not taking: Reported on 05/09/2018), Disp: 90 tablet, Rfl: 0 .  prochlorperazine (COMPAZINE) 10 MG tablet, Take 1 tablet (10 mg total) by mouth every 6 (six) hours as needed (Nausea or vomiting). (Patient not taking: Reported on 05/17/2018), Disp: 30 tablet, Rfl: 1 .  traZODone (DESYREL) 100 MG tablet, Take 1 tablet (100 mg total) by mouth at bedtime. (Patient not taking: Reported on  05/09/2018), Disp: 30 tablet, Rfl: 0  Physical exam:  Vitals:   05/17/18 0844  BP: 124/65  Pulse: 80  Resp: 18  Temp: (!) 97.3 F (36.3 C)  TempSrc: Tympanic  SpO2: 97%  Weight: 126 lb 1.6 oz (57.2 kg)  Height: 6' (1.829 m)   Physical Exam  Constitutional: He is oriented to person, place, and time.  Patient is thin and frail.  Sitting in a wheelchair.  Appears in no acute distress  HENT:  Head: Normocephalic and atraumatic.  Sclerae anicteric  Eyes: Pupils are equal, round, and reactive to light. EOM are normal.  Neck: Normal range of motion.  Cardiovascular: Normal rate, regular rhythm and normal heart sounds.  Right chest wall.  Port in place accessed  Pulmonary/Chest: Effort normal and breath sounds normal.  Abdominal: Soft. Bowel sounds are normal.  Musculoskeletal: He exhibits edema.  Neurological: He is alert and oriented to person, place, and time.  Skin: Skin is warm and dry.     CMP Latest Ref Rng & Units 05/17/2018  Glucose 70 - 99 mg/dL 78  BUN 8 - 23 mg/dL 11  Creatinine 0.61 - 1.24 mg/dL 0.45(L)  Sodium 135 - 145 mmol/L 132(L)  Potassium 3.5 - 5.1 mmol/L 3.0(L)  Chloride 98 - 111 mmol/L 92(L)  CO2 22 - 32 mmol/L 32  Calcium 8.9 - 10.3 mg/dL 8.4(L)  Total Protein 6.5 - 8.1 g/dL 6.1(L)  Total Bilirubin 0.3 - 1.2 mg/dL 2.7(H)  Alkaline Phos 38 - 126 U/L 258(H)  AST 15 - 41 U/L 24  ALT 0 - 44 U/L 15   CBC Latest Ref Rng & Units 05/17/2018  WBC 4.0 - 10.5 K/uL 9.8  Hemoglobin 13.0 - 17.0 g/dL 10.2(L)  Hematocrit 39.0 - 52.0 % 30.7(L)  Platelets 150 - 400 K/uL 424(H)    No images are attached to the encounter.  Ct Chest W Contrast  Result Date: 04/26/2018 CLINICAL DATA:  Inpatient. Pancreatic head mass with biliary and pancreatic duct dilation, jaundice, abdominal pain. Chest staging. EXAM: CT CHEST WITH CONTRAST TECHNIQUE: Multidetector CT imaging of the chest was performed during intravenous contrast administration. CONTRAST:  59mL OMNIPAQUE IOHEXOL  300 MG/ML  SOLN COMPARISON:  04/25/2018 CT abdomen/pelvis. 10/26/2012 chest radiograph. FINDINGS: Cardiovascular: Normal heart size. No significant pericardial effusion/thickening. Three-vessel coronary atherosclerosis. Atherosclerotic nonaneurysmal thoracic aorta. Dilated main pulmonary artery (3.5 cm diameter). No central pulmonary emboli. Mediastinum/Nodes: No discrete thyroid nodules. Unremarkable esophagus. No axillary adenopathy. Mildly enlarged 1.3 cm right hilar node (series 2/image 85). No left hilar adenopathy. No pathologically enlarged mediastinal nodes. Lungs/Pleura: No pneumothorax. Small dependent right pleural effusion. No left pleural effusion. Moderate to severe centrilobular emphysema with diffuse bronchial wall thickening. Solid right  upper lobe 1.2 cm pulmonary nodule (series 3/image 73). No lung masses or additional significant pulmonary nodules. Mild-to-moderate elevation of the right hemidiaphragm. Patchy somewhat bandlike consolidation and ground-glass opacity at the right lung base with associated volume loss. Upper abdomen: Small hiatal hernia. Re-demonstration prominent diffuse intrahepatic biliary ductal dilatation and pancreatic duct dilation, unchanged. Small to moderate volume upper abdominal ascites. Partial visualization of necrotic adenopathy in the gastrohepatic ligament, unchanged. Musculoskeletal: No aggressive appearing focal osseous lesions. Mild thoracic spondylosis. IMPRESSION: 1. Solitary 1.2 cm solid right upper lobe pulmonary nodule, malignancy not excluded, equivocal for solitary metastasis versus primary bronchogenic carcinoma. 2. Mild right hilar adenopathy, cannot exclude nodal metastatic disease. 3. Small dependent right pleural effusion. 4. Mild-to-moderate elevation of the right hemidiaphragm with patchy somewhat bandlike consolidation and ground-glass opacity at the right lung base with associated volume loss, favor atelectasis/scarring, cannot exclude a component  of pneumonia. 5. Redemonstration of ascites, necrotic gastrohepatic ligament adenopathy and biliary and pancreatic dilatation in the upper abdomen, unchanged. 6. Dilated main pulmonary artery, suggesting pulmonary arterial hypertension. 7. Three-vessel coronary atherosclerosis. 8. Small hiatal hernia. Aortic Atherosclerosis (ICD10-I70.0) and Emphysema (ICD10-J43.9). Electronically Signed   By: Ilona Sorrel M.D.   On: 04/26/2018 11:19   Ct Abdomen Pelvis W Contrast  Result Date: 04/25/2018 CLINICAL DATA:  Acute abdominal pain, hypoglycemia EXAM: CT ABDOMEN AND PELVIS WITH CONTRAST TECHNIQUE: Multidetector CT imaging of the abdomen and pelvis was performed using the standard protocol following bolus administration of intravenous contrast. CONTRAST:  124mL ISOVUE-300 IOPAMIDOL (ISOVUE-300) INJECTION 61% COMPARISON:  10/21/2012 FINDINGS: Lower chest: Mild right lower lobe atelectasis. Hepatobiliary: Liver is within normal limits. Moderate intrahepatic and extrahepatic ductal dilatation. Common duct measures up to 2.4 cm (coronal image 40) with extrinsic compression by a dominant cystic/necrotic lesion in the porta hepatitis. Gallbladder is unremarkable. No intrahepatic or extrahepatic ductal dilatation. Pancreas: 2.2 x 2.9 cm hypoenhancing lesion in the pancreatic head (series 2/image 36), highly suspicious for pancreatic neoplasm. Severe dilatation of the main pancreatic duct in the body/tail (series 2/image 27). Associated pancreatic atrophy. Peripancreatic fluid collections with enhancement along the anterior aspect of the pancreatic body/tail (series 2/image 27). Additional 4.1 x 3.8 cm cystic/necrotic lesion in the region of the pancreatic head, with mass effect on the common duct. Spleen: Within normal limits. Adrenals/Urinary Tract: Bilateral adrenal nodules, measuring up to 2.7 cm on the left,similar to 2014 and likely reflecting benign adrenal adenomas. Kidneys are within normal limits.  No hydronephrosis.  Bladder is grossly unremarkable. Stomach/Bowel: Stomach is notable for a tiny hiatal hernia. Visualized bowel is grossly unremarkable. Appendix is not discretely visualized. Vascular/Lymphatic: No evidence of abdominal aortic aneurysm. No vascular involvement. Atherosclerotic calcifications of the abdominal aorta and branch vessels. No suspicious abdominopelvic lymphadenopathy. Reproductive: Prostate is grossly unremarkable. Other: Moderate abdominopelvic ascites. Postsurgical changes related to prior right ventral hernia mesh repair. Musculoskeletal: Grade 1 anterolisthesis at L5-S1. IMPRESSION: 2.2 x 2.9 cm hypoenhancing lesion in the pancreatic head, highly suspicious for pancreatic neoplasm. Associated double duct sign with intrahepatic/extrahepatic ductal dilatation and dilatation of the pancreatic duct. Associated atrophy of the pancreatic body/tail. Associated peripancreatic fluid collections versus necrotic lesions, including a dominant 4.1 x 3.8 cm cystic lesion superior to the pancreatic head, with mass effect on the distal common duct. Moderate abdominopelvic ascites. Additional ancillary findings as above. Electronically Signed   By: Julian Hy M.D.   On: 04/25/2018 23:01   US Abdomen Limited  Result Date: 04/26/2018 CLINICAL DATA:  Pancreatic mass.  Moderate ascites described on  CT EXAM: LIMITED ABDOMEN ULTRASOUND FOR ASCITES TECHNIQUE: Limited ultrasound survey for ascites was performed in all four abdominal quadrants. COMPARISON:  CT 04/25/2018 FINDINGS: There is a small amount of scattered abdominal ascites. No large pocket for safe paracentesis. IMPRESSION: Small amount of scattered abdominal ascites.  Paracentesis deferred. Electronically Signed   By: Lucrezia Europe M.D.   On: 04/26/2018 16:14   US Venous Img Lower Bilateral  Result Date: 05/04/2018 CLINICAL DATA:  Swelling and fevers EXAM: BILATERAL LOWER EXTREMITY VENOUS DOPPLER ULTRASOUND TECHNIQUE: Gray-scale sonography with graded  compression, as well as color Doppler and duplex ultrasound were performed to evaluate the lower extremity deep venous systems from the level of the common femoral vein and including the common femoral, femoral, profunda femoral, popliteal and calf veins including the posterior tibial, peroneal and gastrocnemius veins when visible. The superficial great saphenous vein was also interrogated. Spectral Doppler was utilized to evaluate flow at rest and with distal augmentation maneuvers in the common femoral, femoral and popliteal veins. COMPARISON:  None. FINDINGS: RIGHT LOWER EXTREMITY Common Femoral Vein: No evidence of thrombus. Normal compressibility, respiratory phasicity and response to augmentation. Saphenofemoral Junction: No evidence of thrombus. Normal compressibility and flow on color Doppler imaging. Profunda Femoral Vein: No evidence of thrombus. Normal compressibility and flow on color Doppler imaging. Femoral Vein: No evidence of thrombus. Normal compressibility, respiratory phasicity and response to augmentation. Popliteal Vein: No evidence of thrombus. Normal compressibility, respiratory phasicity and response to augmentation. Calf Veins: No evidence of thrombus. Normal compressibility and flow on color Doppler imaging. Superficial Great Saphenous Vein: No evidence of thrombus. Normal compressibility. Venous Reflux:  None. Other Findings: Better visualized on the current exam is a popliteal cyst medially. LEFT LOWER EXTREMITY Common Femoral Vein: No evidence of thrombus. Normal compressibility, respiratory phasicity and response to augmentation. Saphenofemoral Junction: No evidence of thrombus. Normal compressibility and flow on color Doppler imaging. Profunda Femoral Vein: No evidence of thrombus. Normal compressibility and flow on color Doppler imaging. Femoral Vein: No evidence of thrombus. Normal compressibility, respiratory phasicity and response to augmentation. Popliteal Vein: No evidence of  thrombus. Normal compressibility, respiratory phasicity and response to augmentation. Calf Veins: No evidence of thrombus. Normal compressibility and flow on color Doppler imaging. Superficial Great Saphenous Vein: No evidence of thrombus. Normal compressibility. Venous Reflux:  None. Other Findings: Better seen on the current exam is a popliteal cyst medially. IMPRESSION: No evidence of deep venous thrombosis bilaterally. Better visualized on today's exam are bilateral popliteal cysts Electronically Signed   By: Inez Catalina M.D.   On: 05/04/2018 14:31   US Venous Img Lower Bilateral  Result Date: 04/26/2018 CLINICAL DATA:  Bilateral lower extremity swelling EXAM: BILATERAL LOWER EXTREMITY VENOUS DOPPLER ULTRASOUND TECHNIQUE: Gray-scale sonography with graded compression, as well as color Doppler and duplex ultrasound were performed to evaluate the lower extremity deep venous systems from the level of the common femoral vein and including the common femoral, femoral, profunda femoral, popliteal and calf veins including the posterior tibial, peroneal and gastrocnemius veins when visible. The superficial great saphenous vein was also interrogated. Spectral Doppler was utilized to evaluate flow at rest and with distal augmentation maneuvers in the common femoral, femoral and popliteal veins. COMPARISON:  None. FINDINGS: RIGHT LOWER EXTREMITY Common Femoral Vein: No evidence of thrombus. Normal compressibility, respiratory phasicity and response to augmentation. Saphenofemoral Junction: No evidence of thrombus. Normal compressibility and flow on color Doppler imaging. Profunda Femoral Vein: No evidence of thrombus. Normal compressibility and flow on color Doppler  imaging. Femoral Vein: No evidence of thrombus. Normal compressibility, respiratory phasicity and response to augmentation. Popliteal Vein: No evidence of thrombus. Normal compressibility, respiratory phasicity and response to augmentation. Calf Veins: No  evidence of thrombus. Normal compressibility and flow on color Doppler imaging. Superficial Great Saphenous Vein: No evidence of thrombus. Normal compressibility. Venous Reflux:  None. Other Findings:  None. LEFT LOWER EXTREMITY Common Femoral Vein: No evidence of thrombus. Normal compressibility, respiratory phasicity and response to augmentation. Saphenofemoral Junction: No evidence of thrombus. Normal compressibility and flow on color Doppler imaging. Profunda Femoral Vein: No evidence of thrombus. Normal compressibility and flow on color Doppler imaging. Femoral Vein: No evidence of thrombus. Normal compressibility, respiratory phasicity and response to augmentation. Popliteal Vein: No evidence of thrombus. Normal compressibility, respiratory phasicity and response to augmentation. Calf Veins: No evidence of thrombus. Normal compressibility and flow on color Doppler imaging. Superficial Great Saphenous Vein: No evidence of thrombus. Normal compressibility. Venous Reflux:  None. Other Findings: Bilateral lower extremity subcutaneous edema noted. Bilateral peripheral atherosclerosis evident. IMPRESSION: No evidence of deep venous thrombosis. Electronically Signed   By: Jerilynn Mages.  Shick M.D.   On: 04/26/2018 15:05   Dg Chest Portable 1 View  Result Date: 05/03/2018 CLINICAL DATA:  Altered mental status.  Weakness. EXAM: PORTABLE CHEST 1 VIEW COMPARISON:  CT 04/26/2018, CXR 10/26/2012 FINDINGS: Redemonstration of a nodular density projecting over the right mid lung laterally. Mild emphysematous hyperinflation of the lungs with chronic interstitial change. No alveolar confluence, pulmonary edema, effusion or pneumothorax. Dextroconvex curvature of the mid to upper thoracic spine is redemonstrated. Heart and mediastinal contours are within normal limits with mild-to-moderate aortic atherosclerosis noted at the arch. No aneurysm is identified. No suspicious nor aggressive osseous lesions. IMPRESSION: 1. Emphysematous  hyperinflation of the lungs with peripheral right midlung nodular density noted as documented on recent CT. 2. Chronic interstitial change of the lungs without alveolar confluence, edema, effusion or pneumothorax. Electronically Signed   By: Ashley Royalty M.D.   On: 05/03/2018 20:13   Dg C-arm 1-60 Min-no Report  Result Date: 04/26/2018 Fluoroscopy was utilized by the requesting physician.  No radiographic interpretation.     Assessment and plan- Patient is a 77 y.o. 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  He is here for on treatment assessment prior to cycle 1 day 1 of gemcitabine.  Overall his bilirubin continues to trend down and is down to 2.7 today but has not yet normalized.  AST and ALT are within normal limits.  I will therefore proceed with low-dose gemcitabine 800 mg/m but hold off on using Abraxane at this time until his bilirubin is below 1.5.  I will try to give him gemcitabine 800 mg/m 2 weeks on 1 week off as tolerated.  I will see him back in 1 week's time with CBC and CMP for cycle 1 day 8 of gemcitabine.  If he does not tolerate this regimen I will switch him to 1 week on 1 week off regimen.  Discussed that goals of chemotherapy are palliative.  Abnormal weight loss: Secondary to pancreatic cancer.  Nutrition on board continue to follow  Lateral lower extremity edema secondary to hypoalbuminemia.  Continue to monitor  Suspect anemia secondary to underlying malignancy.  I will check ferritin and iron studies as well as B12 and folate with the next set of labs  We will renew his oxycodone prescription on Monday   Visit Diagnosis 1.  Malignant neoplasm of pancreas, unspecified location of malignancy ([place])   2. Encounter for antineoplastic chemotherapy   3. Obstructive jaundice      Dr. Randa Evens, MD, MPH [facility] at [facility] [phone] 05/17/2018 10:18 AM

## 2018-05-20 ENCOUNTER — Telehealth: Payer: Self-pay

## 2018-05-20 ENCOUNTER — Other Ambulatory Visit: Payer: Self-pay | Admitting: *Deleted

## 2018-05-20 MED ORDER — OXYCODONE HCL 5 MG PO TABS: 5.0000 mg | ORAL_TABLET | Freq: Three times a day (TID) | ORAL | 0 refills | Status: AC | PRN

## 2018-05-20 NOTE — Telephone Encounter (Signed)
Received call from Bryan Hicks requesting refill on his oxycodone. He picked it up on 10/10 and received 21 tabs with no refills per bottle. He has 4 tabs left. The original prescription looks like 90 tabs was ordered. Called [facility] and their computer is down and they are unable to look up his script. Oncology Nurse Navigator Documentation  Navigator Location: [facility] (05/20/18 1600)   )Navigator Encounter Type: Telephone (05/20/18 1600) Telephone: Incoming Call (05/20/18 1600)                                                  Time Spent with Patient: 15 (05/20/18 1600)

## 2018-05-22 NOTE — Telephone Encounter (Signed)
Hey I called also and they would only give him 21 per his insurance. I told him that I would try it again and put in the rx that it is 30 day supply. He said he will do what ever the insurance allows

## 2018-05-23 ENCOUNTER — Telehealth: Payer: Self-pay

## 2018-05-23 NOTE — Telephone Encounter (Signed)
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    Called patient for nutrition follow-up.  Patient reports that his appetite is fairly good in the am and then decreases during the day.  Reports had cereal this am, part of shake and water.  Last night ate stew beef, lima beans and mashed potatoes.  Yesterday for lunch ate pimento cheese sandwich with pickles.  Reports gets full quickly.  Likes ensure and boost shakes.  Does not like glucerna anymore as it taste like chalk.  Wife reports patient stays busy in the am then gets really tired around 5-6pm.     Medications: reviewed  Labs: reviewed  Anthropometrics:   Weight decreased to 126 lb 1.6 oz on 10/25 from 136 lb 8 oz on 10/17.    7% weight loss in the last 8 days   NUTRITION DIAGNOSIS: Unintentional weight loss continues    INTERVENTION:  Encouraged small frequent meals eating about every 1-2 hours.   Reviewed high calorie, high protein foods.   Encouraged drinking high calorie shakes vs glucerna as needs additional calories at this time.  Will provide 1st case of ensure enlive and patient to pick up tomorrow.      MONITORING, EVALUATION, GOAL: weight trends, intake   NEXT VISIT: phone f/u on Monday, Nov 11  Aireal Slater B. Zenia Resides, [place], [place] Registered Dietitian [phone] (pager)

## 2018-05-24 ENCOUNTER — Encounter: Payer: Self-pay | Admitting: *Deleted

## 2018-05-24 ENCOUNTER — Inpatient Hospital Stay: Payer: Medicare Other

## 2018-05-24 ENCOUNTER — Encounter: Payer: Self-pay | Admitting: Oncology

## 2018-05-24 ENCOUNTER — Inpatient Hospital Stay: Payer: Medicare Other | Attending: Oncology

## 2018-05-24 ENCOUNTER — Inpatient Hospital Stay (HOSPITAL_BASED_OUTPATIENT_CLINIC_OR_DEPARTMENT_OTHER): Payer: Medicare Other | Admitting: Oncology

## 2018-05-24 VITALS — BP 116/65 | HR 86 | Temp 97.4°F | Resp 18 | Ht 72.0 in | Wt 123.4 lb

## 2018-05-24 DIAGNOSIS — R531 Weakness: Secondary | ICD-10-CM | POA: Diagnosis not present

## 2018-05-24 DIAGNOSIS — Z66 Do not resuscitate: Secondary | ICD-10-CM | POA: Insufficient documentation

## 2018-05-24 DIAGNOSIS — C250 Malignant neoplasm of head of pancreas: Secondary | ICD-10-CM | POA: Insufficient documentation

## 2018-05-24 DIAGNOSIS — E876 Hypokalemia: Secondary | ICD-10-CM

## 2018-05-24 DIAGNOSIS — Z79899 Other long term (current) drug therapy: Secondary | ICD-10-CM | POA: Insufficient documentation

## 2018-05-24 DIAGNOSIS — C779 Secondary and unspecified malignant neoplasm of lymph node, unspecified: Secondary | ICD-10-CM | POA: Diagnosis not present

## 2018-05-24 DIAGNOSIS — G4700 Insomnia, unspecified: Secondary | ICD-10-CM | POA: Diagnosis not present

## 2018-05-24 DIAGNOSIS — F17210 Nicotine dependence, cigarettes, uncomplicated: Secondary | ICD-10-CM

## 2018-05-24 DIAGNOSIS — Z5111 Encounter for antineoplastic chemotherapy: Secondary | ICD-10-CM

## 2018-05-24 DIAGNOSIS — C259 Malignant neoplasm of pancreas, unspecified: Secondary | ICD-10-CM

## 2018-05-24 LAB — COMPREHENSIVE METABOLIC PANEL
ALK PHOS: 197 U/L — AB (ref 38–126)
ALT: 17 U/L (ref 0–44)
AST: 23 U/L (ref 15–41)
Albumin: 2.7 g/dL — ABNORMAL LOW (ref 3.5–5.0)
Anion gap: 9 (ref 5–15)
BUN: 7 mg/dL — ABNORMAL LOW (ref 8–23)
CALCIUM: 8.5 mg/dL — AB (ref 8.9–10.3)
CO2: 29 mmol/L (ref 22–32)
Chloride: 89 mmol/L — ABNORMAL LOW (ref 98–111)
Creatinine, Ser: 0.49 mg/dL — ABNORMAL LOW (ref 0.61–1.24)
Glucose, Bld: 113 mg/dL — ABNORMAL HIGH (ref 70–99)
Potassium: 2.9 mmol/L — ABNORMAL LOW (ref 3.5–5.1)
Sodium: 127 mmol/L — ABNORMAL LOW (ref 135–145)
TOTAL PROTEIN: 6.4 g/dL — AB (ref 6.5–8.1)
Total Bilirubin: 2.3 mg/dL — ABNORMAL HIGH (ref 0.3–1.2)

## 2018-05-24 LAB — CBC WITH DIFFERENTIAL/PLATELET
Abs Immature Granulocytes: 0.03 10*3/uL (ref 0.00–0.07)
BASOS ABS: 0 10*3/uL (ref 0.0–0.1)
Basophils Relative: 0 %
EOS ABS: 0 10*3/uL (ref 0.0–0.5)
EOS PCT: 0 %
HCT: 31 % — ABNORMAL LOW (ref 39.0–52.0)
Hemoglobin: 10.5 g/dL — ABNORMAL LOW (ref 13.0–17.0)
Immature Granulocytes: 0 %
Lymphocytes Relative: 15 %
Lymphs Abs: 1.3 10*3/uL (ref 0.7–4.0)
MCH: 32.3 pg (ref 26.0–34.0)
MCHC: 33.9 g/dL (ref 30.0–36.0)
MCV: 95.4 fL (ref 80.0–100.0)
Monocytes Absolute: 0.7 10*3/uL (ref 0.1–1.0)
Monocytes Relative: 8 %
NRBC: 0 % (ref 0.0–0.2)
Neutro Abs: 6.3 10*3/uL (ref 1.7–7.7)
Neutrophils Relative %: 77 %
Platelets: 246 10*3/uL (ref 150–400)
RBC: 3.25 MIL/uL — AB (ref 4.22–5.81)
RDW: 14.1 % (ref 11.5–15.5)
WBC: 8.3 10*3/uL (ref 4.0–10.5)

## 2018-05-24 MED ORDER — SODIUM CHLORIDE 0.9 % IV SOLN
1400.0000 mg | Freq: Once | INTRAVENOUS | Status: AC
  Administered 2018-05-24: 1400 mg via INTRAVENOUS
  Filled 2018-05-24: qty 26.3

## 2018-05-24 MED ORDER — SODIUM CHLORIDE 0.9% FLUSH
10.0000 mL | Freq: Once | INTRAVENOUS | Status: AC
  Administered 2018-05-24: 10 mL via INTRAVENOUS
  Filled 2018-05-24: qty 10

## 2018-05-24 MED ORDER — SODIUM CHLORIDE 0.9 % IV SOLN
Freq: Once | INTRAVENOUS | Status: AC
  Administered 2018-05-24: 11:00:00 via INTRAVENOUS
  Filled 2018-05-24: qty 1000

## 2018-05-24 MED ORDER — PROCHLORPERAZINE MALEATE 10 MG PO TABS
10.0000 mg | ORAL_TABLET | Freq: Once | ORAL | Status: AC
  Administered 2018-05-24: 10 mg via ORAL
  Filled 2018-05-24: qty 1

## 2018-05-24 MED ORDER — SODIUM CHLORIDE 0.9 % IV SOLN
Freq: Once | INTRAVENOUS | Status: AC
  Administered 2018-05-24: 14:00:00 via INTRAVENOUS
  Filled 2018-05-24: qty 250

## 2018-05-24 MED ORDER — HEPARIN SOD (PORK) LOCK FLUSH 100 UNIT/ML IV SOLN
500.0000 [IU] | Freq: Once | INTRAVENOUS | Status: AC
  Administered 2018-05-24: 500 [IU] via INTRAVENOUS
  Filled 2018-05-24: qty 5

## 2018-05-24 NOTE — Progress Notes (Signed)
No new changes noted today 

## 2018-05-24 NOTE — Progress Notes (Signed)
Patient can proceed with chemotherapy today.  Total bili is 2.3.  This is outside of the parameters for chemotherapy and Dr. Janese Banks would like to proceed with treatment today.  Staff and infusion as well as pharmacy has been notified.  Also the bilirubin of 2.3 is better than it was before and patient has pancreatic cancer

## 2018-05-27 ENCOUNTER — Other Ambulatory Visit: Payer: Self-pay | Admitting: Oncology

## 2018-05-27 DIAGNOSIS — C259 Malignant neoplasm of pancreas, unspecified: Secondary | ICD-10-CM

## 2018-05-27 NOTE — Progress Notes (Signed)
Hematology/Oncology Consult note [facility]  Telephone:([phone] [phone]  Patient Care Team: Sindy Guadeloupe, MD as PCP - General (Oncology) Clent Jacks, RN as Registered Nurse   Name of the patient: Bryan Hicks  [id number]  1941/04/10   Date of visit: 05/27/18  Diagnosis- Stage IV pancreatic cancerT2N1M1with lymph nodesmetsand possible lung metastases  Chief complaint/ Reason for visit-on treatment assessment prior to cycle 1 day 8 of gemcitabine  Heme/Onc history:  patient is a 77 year old male who presented [facility] the [facility] 10[facility]/2019with symptoms of generalized weakness and bilateral lower extremity swelling. During his recent appointment with primary care doctor he was found to have jaundice. On admission patient was noted to have a total bilirubin of 16 along with alkaline phosphatase of 1507 and elevated AST and ALT respectively. CBC showed white count of 12.4 and H&H of 9.1/25 with an MCV of 103 and a platelet count of 362. CT abdomen showed 2.2 x 2.9 cm mass in the pancreatic head concerning for pancreatic neoplasm. Double duct sign with intra-and extrahepatic ductal dilatation and dilatation of the pancreatic duct. Peripancreatic fluid collection versus necrotic lesions including a dominant 4.1 x 3.8 cm due to the pancreatic head with mass-effect in the distal bile duct. Moderate ascites. CT chest showed solitary 1.2 cm right upper lobe nodule-solitary metastases versus primary lung cancer.Gastrohepatic ligament adenopathyCA-[id number] elevated at 1988  Dr. Bunnie Philips ERCP with stent placement and biopieswhich showed adenocarcinoma which appears to be invading the duodenum and clinically from the pancreas.  Cycle 1 of palliative single agent gemcitabine started on 05/17/2018  Interval history-he reports that his appetite is fair and he is trying to eat.  He has not been taking his Ensure every day as recommended.  He  continues to lose weight and has lost about 30 pounds over the last 1/2 weeks.  Leg swelling has improved.  Abdominal pain is well controlled  ECOG PS- 2 Pain scale- 0 Opioid associated constipation- no  Review of systems- Review of Systems  Constitutional: Positive for malaise/fatigue. Negative for chills, fever and weight loss.  HENT: Negative for congestion, ear discharge and nosebleeds.   Eyes: Negative for blurred vision.  Respiratory: Negative for cough, hemoptysis, sputum production, shortness of breath and wheezing.   Cardiovascular: Positive for leg swelling. Negative for chest pain, palpitations, orthopnea and claudication.  Gastrointestinal: Positive for abdominal pain. Negative for blood in stool, constipation, diarrhea, heartburn, melena, nausea and vomiting.  Genitourinary: Negative for dysuria, flank pain, frequency, hematuria and urgency.  Musculoskeletal: Negative for back pain, joint pain and myalgias.  Skin: Negative for rash.  Neurological: Negative for dizziness, tingling, focal weakness, seizures, weakness and headaches.  Endo/Heme/Allergies: Does not bruise/bleed easily.  Psychiatric/Behavioral: Negative for depression and suicidal ideas. The patient does not have insomnia.      No Known Allergies   Past Medical History:  Diagnosis Date  . COPD (chronic obstructive pulmonary disease) ([place])   . Diabetes mellitus without complication ([place])   . Hypertension   . Shortness of breath dyspnea      Past Surgical History:  Procedure Laterality Date  . COLON SURGERY     "twisted intestine"  . ERCP N/A 04/26/2018   Procedure: ENDOSCOPIC RETROGRADE CHOLANGIOPANCREATOGRAPHY (ERCP);  Surgeon: Lucilla Lame, MD;  Location: [facility] ENDOSCOPY;  Service: Endoscopy;  Laterality: N/A;  . HERNIA MESH REMOVAL Right   . INGUINAL HERNIA REPAIR Left 12/04/2014   Procedure: HERNIA REPAIR INGUINAL ADULT;  Surgeon: Rochel Brome, MD;  Location:  [facility] ORS;  Service: General;  Laterality:  Left;  . PORTA CATH INSERTION N/A 05/16/2018   Procedure: PORTA CATH INSERTION;  Surgeon: Algernon Huxley, MD;  Location: [facility];  Service: Cardiovascular;  Laterality: N/A;    Social History   Socioeconomic History  . Marital status: Married    Spouse name: Not on file  . Number of children: Not on file  . Years of education: Not on file  . Highest education level: Not on file  Occupational History  . Not on file  Social Needs  . Financial resource strain: Not on file  . Food insecurity:    Worry: Patient refused    Inability: Patient refused  . Transportation needs:    Medical: Patient refused    Non-medical: Patient refused  Tobacco Use  . Smoking status: Current Every Day Smoker    Packs/day: 1.00    Years: 30.00    Pack years: 30.00  . Smokeless tobacco: Former Network engineer and Sexual Activity  . Alcohol use: No  . Drug use: No  . Sexual activity: Never  Lifestyle  . Physical activity:    Days per week: Patient refused    Minutes per session: Patient refused  . Stress: Not on file  Relationships  . Social connections:    Talks on phone: Patient refused    Gets together: Patient refused    Attends religious service: Patient refused    Active member of club or organization: Patient refused    Attends meetings of clubs or organizations: Patient refused    Relationship status: Patient refused  . Intimate partner violence:    Fear of current or ex partner: Patient refused    Emotionally abused: Patient refused    Physically abused: Patient refused    Forced sexual activity: Patient refused  Other Topics Concern  . Not on file  Social History Narrative  . Not on file    Family History  Problem Relation Age of Onset  . Colon cancer Mother   . Melanoma Father      Current Outpatient Medications:  .  insulin aspart (NOVOLOG) 100 UNIT/ML injection, 5 units subcutaneous prior to meals if sugars greater than 150, Disp: 10 mL, Rfl: 11 .  LANTUS  SOLOSTAR 100 UNIT/ML Solostar Pen, 6 units suncutanous injection daily, Disp: 15 mL, Rfl: 12 .  magnesium chloride (SLOW-MAG) 64 MG TBEC SR tablet, Take 1 tablet by mouth daily., Disp: , Rfl:  .  oxyCODONE (OXY IR/ROXICODONE) 5 MG immediate release tablet, Take 1 tablet (5 mg total) by mouth every 8 (eight) hours as needed for severe pain., Disp: 90 tablet, Rfl: 0 .  potassium chloride SA (K-DUR,KLOR-CON) 20 MEQ tablet, Take 1 tablet (20 mEq total) by mouth daily., Disp: 30 tablet, Rfl: 0 .  vitamin B-12 (CYANOCOBALAMIN) 1000 MCG tablet, Take 1,000 mcg by mouth daily., Disp: , Rfl:  .  docusate sodium (COLACE) 100 MG capsule, Take 1 capsule (100 mg total) by mouth daily as needed for mild constipation. (Patient not taking: Reported on 05/09/2018), Disp: 60 capsule, Rfl: 0 .  Fluticasone-Salmeterol (ADVAIR) 250-50 MCG/DOSE AEPB, Inhale 1 puff into the lungs 2 (two) times daily. , Disp: , Rfl:  .  levofloxacin (LEVAQUIN) 500 MG tablet, Take 1 tablet (500 mg total) by mouth daily. (Patient not taking: Reported on 05/17/2018), Disp: 5 tablet, Rfl: 0 .  lidocaine-prilocaine (EMLA) cream, Apply to affected area once (Patient not taking: Reported on 05/17/2018), Disp: 30 g, Rfl:  3 .  LORazepam (ATIVAN) 0.5 MG tablet, Take 1 tablet (0.5 mg total) by mouth at bedtime as needed for anxiety. (Patient not taking: Reported on 05/17/2018), Disp: 30 tablet, Rfl: 0 .  Melatonin 5 MG TABS, Take 1 tablet (5 mg total) by mouth at bedtime. (Patient not taking: Reported on 05/09/2018), Disp: 30 tablet, Rfl: 0 .  ondansetron (ZOFRAN) 8 MG tablet, Take 1 tablet (8 mg total) by mouth 2 (two) times daily as needed (Nausea or vomiting). (Patient not taking: Reported on 05/17/2018), Disp: 30 tablet, Rfl: 1 .  prochlorperazine (COMPAZINE) 10 MG tablet, Take 1 tablet (10 mg total) by mouth every 6 (six) hours as needed (Nausea or vomiting). (Patient not taking: Reported on 05/17/2018), Disp: 30 tablet, Rfl: 1 .  traZODone (DESYREL)  100 MG tablet, Take 1 tablet (100 mg total) by mouth at bedtime. (Patient not taking: Reported on 05/09/2018), Disp: 30 tablet, Rfl: 0 No current facility-administered medications for this visit.   Facility-Administered Medications Ordered in Other Visits:  .  sodium chloride flush (NS) 0.9 % injection 10 mL, 10 mL, Intracatheter, PRN, Sindy Guadeloupe, MD  Physical exam:  Vitals:   05/24/18 0958  BP: 116/65  Pulse: 86  Resp: 18  Temp: (!) 97.4 F (36.3 C)  SpO2: 98%  Weight: 123 lb 6.4 oz (56 kg)  Height: 6' (1.829 m)   Physical Exam  Constitutional: He is oriented to person, place, and time.  Thin elderly male who appears mildly fatigued.  He is sitting in a wheelchair  HENT:  Head: Normocephalic and atraumatic.  Eyes: Pupils are equal, round, and reactive to light. EOM are normal.  Sclerae icteric  Neck: Normal range of motion.  Cardiovascular: Normal rate, regular rhythm and normal heart sounds.  Pulmonary/Chest: Effort normal and breath sounds normal.  Abdominal: Soft. Bowel sounds are normal.  Musculoskeletal: He exhibits edema.  Neurological: He is alert and oriented to person, place, and time.  Skin: Skin is warm and dry.     CMP Latest Ref Rng & Units 05/24/2018  Glucose 70 - 99 mg/dL 113(H)  BUN 8 - 23 mg/dL 7(L)  Creatinine 0.61 - 1.24 mg/dL 0.49(L)  Sodium 135 - 145 mmol/L 127(L)  Potassium 3.5 - 5.1 mmol/L 2.9(L)  Chloride 98 - 111 mmol/L 89(L)  CO2 22 - 32 mmol/L 29  Calcium 8.9 - 10.3 mg/dL 8.5(L)  Total Protein 6.5 - 8.1 g/dL 6.4(L)  Total Bilirubin 0.3 - 1.2 mg/dL 2.3(H)  Alkaline Phos 38 - 126 U/L 197(H)  AST 15 - 41 U/L 23  ALT 0 - 44 U/L 17   CBC Latest Ref Rng & Units 05/24/2018  WBC 4.0 - 10.5 K/uL 8.3  Hemoglobin 13.0 - 17.0 g/dL 10.5(L)  Hematocrit 39.0 - 52.0 % 31.0(L)  Platelets 150 - 400 K/uL 246    No images are attached to the encounter.  US Venous Img Lower Bilateral  Result Date: 05/04/2018 CLINICAL DATA:  Swelling and fevers  EXAM: BILATERAL LOWER EXTREMITY VENOUS DOPPLER ULTRASOUND TECHNIQUE: Gray-scale sonography with graded compression, as well as color Doppler and duplex ultrasound were performed to evaluate the lower extremity deep venous systems from the level of the common femoral vein and including the common femoral, femoral, profunda femoral, popliteal and calf veins including the posterior tibial, peroneal and gastrocnemius veins when visible. The superficial great saphenous vein was also interrogated. Spectral Doppler was utilized to evaluate flow at rest and with distal augmentation maneuvers in the common femoral, femoral and  popliteal veins. COMPARISON:  None. FINDINGS: RIGHT LOWER EXTREMITY Common Femoral Vein: No evidence of thrombus. Normal compressibility, respiratory phasicity and response to augmentation. Saphenofemoral Junction: No evidence of thrombus. Normal compressibility and flow on color Doppler imaging. Profunda Femoral Vein: No evidence of thrombus. Normal compressibility and flow on color Doppler imaging. Femoral Vein: No evidence of thrombus. Normal compressibility, respiratory phasicity and response to augmentation. Popliteal Vein: No evidence of thrombus. Normal compressibility, respiratory phasicity and response to augmentation. Calf Veins: No evidence of thrombus. Normal compressibility and flow on color Doppler imaging. Superficial Great Saphenous Vein: No evidence of thrombus. Normal compressibility. Venous Reflux:  None. Other Findings: Better visualized on the current exam is a popliteal cyst medially. LEFT LOWER EXTREMITY Common Femoral Vein: No evidence of thrombus. Normal compressibility, respiratory phasicity and response to augmentation. Saphenofemoral Junction: No evidence of thrombus. Normal compressibility and flow on color Doppler imaging. Profunda Femoral Vein: No evidence of thrombus. Normal compressibility and flow on color Doppler imaging. Femoral Vein: No evidence of thrombus. Normal  compressibility, respiratory phasicity and response to augmentation. Popliteal Vein: No evidence of thrombus. Normal compressibility, respiratory phasicity and response to augmentation. Calf Veins: No evidence of thrombus. Normal compressibility and flow on color Doppler imaging. Superficial Great Saphenous Vein: No evidence of thrombus. Normal compressibility. Venous Reflux:  None. Other Findings: Better seen on the current exam is a popliteal cyst medially. IMPRESSION: No evidence of deep venous thrombosis bilaterally. Better visualized on today's exam are bilateral popliteal cysts Electronically Signed   By: Inez Catalina M.D.   On: 05/04/2018 14:31   Dg Chest Portable 1 View  Result Date: 05/03/2018 CLINICAL DATA:  Altered mental status.  Weakness. EXAM: PORTABLE CHEST 1 VIEW COMPARISON:  CT 04/26/2018, CXR 10/26/2012 FINDINGS: Redemonstration of a nodular density projecting over the right mid lung laterally. Mild emphysematous hyperinflation of the lungs with chronic interstitial change. No alveolar confluence, pulmonary edema, effusion or pneumothorax. Dextroconvex curvature of the mid to upper thoracic spine is redemonstrated. Heart and mediastinal contours are within normal limits with mild-to-moderate aortic atherosclerosis noted at the arch. No aneurysm is identified. No suspicious nor aggressive osseous lesions. IMPRESSION: 1. Emphysematous hyperinflation of the lungs with peripheral right midlung nodular density noted as documented on recent CT. 2. Chronic interstitial change of the lungs without alveolar confluence, edema, effusion or pneumothorax. Electronically Signed   By: Ashley Royalty M.D.   On: 05/03/2018 20:13     Assessment and plan- Patient is a 77 y.o. 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  He is eager for on treatment assessment prior to cycle 1 day 8 of gemcitabine  Patient tolerated cycle 1 of chemotherapy  relatively well without any significant side effects such as nausea or vomiting.  His bilirubin is trending down to 2.3 today from 2.7 a week ago.  It has not normalized yet.  I will continue single agent gemcitabine he will proceed with cycle 1 day 8 of treatment today.  I will see him back in 2 weeks time with CBC CMP for cycle 2-day 1 of gemcitabine.  Once his bilirubin is less than 1.5, I will consider adding Abraxane at that time.  His weight loss continues to be an issue and I have encouraged him to use Ensure 2-3 times a day and not as needed.  Hypokalemia: potassium 2.9 today. Will give 40 meq IV potassium today. Repeat cmp, mag next week for possible IV  potassium and/ or magnesium   Visit Diagnosis 1. Malignant neoplasm of pancreas, unspecified location of malignancy ([place])   2. Hypokalemia   3. Encounter for antineoplastic chemotherapy      Dr. Randa Evens, MD, MPH [facility] [phone] 05/27/2018 9:56 AM

## 2018-05-28 ENCOUNTER — Telehealth: Payer: Self-pay

## 2018-05-28 NOTE — Telephone Encounter (Signed)
Received voicemail from daughter, Colletta [place] to call. Attempted to return call. Left voicemail. Oncology Nurse Navigator Documentation  Navigator Location: [facility] (05/28/18 1000)   )Navigator Encounter Type: Telephone (05/28/18 1000) Telephone: [place] Call (05/28/18 1000)                                                  Time Spent with Patient: 15 (05/28/18 1000)

## 2018-05-28 NOTE — Telephone Encounter (Signed)
We can get started with home palliative care. Please schedule him to see [employer] as well next time he sees me. He has seen him before

## 2018-05-28 NOTE — Telephone Encounter (Signed)
Received call back from daughter, Colletta [place]. She reports Bryan Hicks is still having difficulty with sleep and is up walking around a good portion of the night. He has been taking two 0.5mg  Ativan instead of the one tab prescribed. She is asking if we can write for 1mg  tablets when he comes in Friday. She also reports increasing weakness and slight agitation. They are now having to assist with ADL's more. She is interested in having some assistance in the home for this. She would like hospice services but feels like she cannot discuss this with him. We talked further about hospice and he is seeing palliative care again on 11/8. Palliative care can address all of  these issues and discuss the services we have to offer for home assist. Oncology Nurse Navigator Documentation  Navigator Location: [facility] (05/28/18 1100)   )Navigator Encounter Type: Telephone (05/28/18 1100) Telephone: Incoming Call;Patient Update;Symptom Mgt (05/28/18 1100)                                                  Time Spent with Patient: 15 (05/28/18 1100)

## 2018-05-31 ENCOUNTER — Inpatient Hospital Stay: Payer: Medicare Other

## 2018-05-31 ENCOUNTER — Other Ambulatory Visit: Payer: Self-pay | Admitting: *Deleted

## 2018-05-31 ENCOUNTER — Inpatient Hospital Stay (HOSPITAL_BASED_OUTPATIENT_CLINIC_OR_DEPARTMENT_OTHER): Payer: Medicare Other | Admitting: Hospice and Palliative Medicine

## 2018-05-31 ENCOUNTER — Encounter: Payer: Self-pay | Admitting: Hospice and Palliative Medicine

## 2018-05-31 VITALS — BP 118/74 | HR 74 | Temp 98.8°F | Resp 18 | Ht 72.0 in | Wt 128.8 lb

## 2018-05-31 DIAGNOSIS — E876 Hypokalemia: Secondary | ICD-10-CM

## 2018-05-31 DIAGNOSIS — R531 Weakness: Secondary | ICD-10-CM | POA: Diagnosis not present

## 2018-05-31 DIAGNOSIS — C259 Malignant neoplasm of pancreas, unspecified: Secondary | ICD-10-CM

## 2018-05-31 DIAGNOSIS — Z515 Encounter for palliative care: Secondary | ICD-10-CM | POA: Diagnosis not present

## 2018-05-31 DIAGNOSIS — C250 Malignant neoplasm of head of pancreas: Secondary | ICD-10-CM | POA: Diagnosis not present

## 2018-05-31 DIAGNOSIS — C779 Secondary and unspecified malignant neoplasm of lymph node, unspecified: Secondary | ICD-10-CM

## 2018-05-31 DIAGNOSIS — G4700 Insomnia, unspecified: Secondary | ICD-10-CM | POA: Diagnosis not present

## 2018-05-31 DIAGNOSIS — Z66 Do not resuscitate: Secondary | ICD-10-CM

## 2018-05-31 DIAGNOSIS — F17210 Nicotine dependence, cigarettes, uncomplicated: Secondary | ICD-10-CM

## 2018-05-31 DIAGNOSIS — Z79899 Other long term (current) drug therapy: Secondary | ICD-10-CM

## 2018-05-31 DIAGNOSIS — Z5111 Encounter for antineoplastic chemotherapy: Secondary | ICD-10-CM | POA: Diagnosis not present

## 2018-05-31 LAB — BASIC METABOLIC PANEL
ANION GAP: 8 (ref 5–15)
BUN: 9 mg/dL (ref 8–23)
CO2: 32 mmol/L (ref 22–32)
Calcium: 8.3 mg/dL — ABNORMAL LOW (ref 8.9–10.3)
Chloride: 86 mmol/L — ABNORMAL LOW (ref 98–111)
Creatinine, Ser: 0.44 mg/dL — ABNORMAL LOW (ref 0.61–1.24)
GFR calc Af Amer: >60 mL/min (ref >60)
Glucose, Bld: 149 mg/dL — ABNORMAL HIGH (ref 70–99)
Potassium: 3.2 mmol/L — ABNORMAL LOW (ref 3.5–5.1)
SODIUM: 126 mmol/L — AB (ref 135–145)

## 2018-05-31 LAB — MAGNESIUM: MAGNESIUM: 1.5 mg/dL — AB (ref 1.7–2.4)

## 2018-05-31 MED ORDER — POTASSIUM CHLORIDE 20 MEQ/15ML (10%) PO SOLN: 20.0000 meq | Freq: Every day | ORAL | Status: DC

## 2018-05-31 MED ORDER — LORAZEPAM 1 MG PO TABS: 1.0000 mg | ORAL_TABLET | Freq: Three times a day (TID) | ORAL | 0 refills | Status: DC | PRN

## 2018-05-31 MED ORDER — SODIUM CHLORIDE 0.9 % IV SOLN
Freq: Once | INTRAVENOUS | Status: AC
  Administered 2018-05-31: 10:00:00 via INTRAVENOUS
  Filled 2018-05-31: qty 250

## 2018-05-31 MED ORDER — POTASSIUM CHLORIDE 20 MEQ/15ML (10%) PO SOLN: 20.0000 meq | Freq: Every day | ORAL | 0 refills | Status: DC

## 2018-05-31 MED ORDER — HEPARIN SOD (PORK) LOCK FLUSH 100 UNIT/ML IV SOLN
500.0000 [IU] | Freq: Once | INTRAVENOUS | Status: AC
  Administered 2018-05-31: 500 [IU] via INTRAVENOUS
  Filled 2018-05-31: qty 5

## 2018-05-31 MED ORDER — SODIUM CHLORIDE 0.9 % IV SOLN
Freq: Once | INTRAVENOUS | Status: AC
  Administered 2018-05-31: 11:00:00 via INTRAVENOUS
  Filled 2018-05-31: qty 250

## 2018-05-31 MED ORDER — INSULIN LISPRO (1 UNIT DIAL) 100 UNIT/ML (KWIKPEN): PEN_INJECTOR | SUBCUTANEOUS | 11 refills | Status: DC

## 2018-05-31 NOTE — Progress Notes (Signed)
[place]  [phone] [phone]   Name: Bryan Hicks Date: 05/31/2018 MRN: [id number]  DOB: [birth year]  Patient Care Team: Sindy Guadeloupe, MD as PCP - General (Oncology) Clent Jacks, RN as Registered Nurse    REASON FOR CONSULTATION: Palliative Care consult requested for this 77 y.o. 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  PMH is also significant for COPD, diabetes, and hypertension.  She was recently hospitalized 05/03/2018 to 05/05/2018 for altered mental status likely secondary to hypoglycemia.,  Patient was also treated for fever of unknown origin and acute on chronic anemia requiring blood transfusion.  He was referred to the palliative care clinic for symptom management and further discussion of goals.   SOCIAL HISTORY:    Patient is married and lives at home with his wife and daughter.  Patient has a son that lives in [place].  Patient used to work as a Administrator.  ADVANCE DIRECTIVES:  Not on file  CODE STATUS: DNR  PAST MEDICAL HISTORY: Past Medical History:  Diagnosis Date  . COPD (chronic obstructive pulmonary disease) ([place])   . Diabetes mellitus without complication ([place])   . Hypertension   . Shortness of breath dyspnea     PAST SURGICAL HISTORY:  Past Surgical History:  Procedure Laterality Date  . COLON SURGERY     "twisted intestine"  . ERCP N/A 04/26/2018   Procedure: ENDOSCOPIC RETROGRADE CHOLANGIOPANCREATOGRAPHY (ERCP);  Surgeon: Lucilla Lame, MD;  Location: [facility] ENDOSCOPY;  Service: Endoscopy;  Laterality: N/A;  . HERNIA MESH REMOVAL Right   . INGUINAL HERNIA REPAIR Left 12/04/2014   Procedure: HERNIA REPAIR INGUINAL ADULT;  Surgeon: Rochel Brome, MD;  Location: [facility] ORS;  Service: General;  Laterality: Left;  . PORTA  CATH INSERTION N/A 05/16/2018   Procedure: PORTA CATH INSERTION;  Surgeon: Algernon Huxley, MD;  Location: [facility];  Service: Cardiovascular;  Laterality: N/A;    HEMATOLOGY/ONCOLOGY HISTORY:    Malignant neoplasm of pancreas ([place])   05/13/2018 Initial Diagnosis    Malignant neoplasm of pancreas ([place])    05/15/2018 -  Chemotherapy    The patient had gemcitabine (GEMZAR) 1,400 mg in sodium chloride 0.9 % 250 mL chemo infusion, 1,406 mg (100 % of original dose 800 mg/m2), Intravenous,  Once, 1 of 6 cycles Dose modification: 800 mg/m2 (original dose 800 mg/m2, Cycle 1, Reason: Other (see comments), Comment: abnormal LFTs) Administration: 1,400 mg (05/17/2018), 1,400 mg (05/24/2018)  for chemotherapy treatment.      ALLERGIES:  has No Known Allergies.  MEDICATIONS:  Current Outpatient Medications  Medication Sig Dispense Refill  . Fluticasone-Salmeterol (ADVAIR) 250-50 MCG/DOSE AEPB Inhale 1 puff into the lungs 2 (two) times daily.     . insulin aspart (NOVOLOG) 100 UNIT/ML injection 5 units subcutaneous prior to meals if sugars greater than 150 10 mL 11  . LANTUS SOLOSTAR 100 UNIT/ML Solostar Pen 6 units suncutanous injection daily 15 mL 12  . magnesium chloride (SLOW-MAG) 64 MG TBEC SR tablet Take 1 tablet by mouth daily.    Hicks Kitchen oxyCODONE (OXY IR/ROXICODONE) 5 MG immediate release tablet Take 1 tablet (5 mg total) by mouth every 8 (eight) hours as needed for severe pain. 90 tablet 0  . potassium chloride SA (K-DUR,KLOR-CON) 20 MEQ tablet Take 1 tablet (20 mEq total) by mouth daily. 30 tablet 0  .  vitamin B-12 (CYANOCOBALAMIN) 1000 MCG tablet Take 1,000 mcg by mouth daily.    Hicks Kitchen docusate sodium (COLACE) 100 MG capsule Take 1 capsule (100 mg total) by mouth daily as needed for mild constipation. (Patient not taking: Reported on 05/09/2018) 60 capsule 0  . insulin lispro (HUMALOG KWIKPEN) 100 UNIT/ML KwikPen Use 5 units at mealtime if blood glucose is > 150 15 mL 11  . levofloxacin  (LEVAQUIN) 500 MG tablet Take 1 tablet (500 mg total) by mouth daily. (Patient not taking: Reported on 05/17/2018) 5 tablet 0  . lidocaine-prilocaine (EMLA) cream Apply to affected area once (Patient not taking: Reported on 05/17/2018) 30 g 3  . LORazepam (ATIVAN) 1 MG tablet Take 1 tablet (1 mg total) by mouth every 8 (eight) hours as needed for anxiety or sleep. 30 tablet 0  . Melatonin 5 MG TABS Take 1 tablet (5 mg total) by mouth at bedtime. (Patient not taking: Reported on 05/09/2018) 30 tablet 0  . ondansetron (ZOFRAN) 8 MG tablet Take 1 tablet (8 mg total) by mouth 2 (two) times daily as needed (Nausea or vomiting). (Patient not taking: Reported on 05/17/2018) 30 tablet 1  . prochlorperazine (COMPAZINE) 10 MG tablet Take 1 tablet (10 mg total) by mouth every 6 (six) hours as needed (Nausea or vomiting). (Patient not taking: Reported on 05/17/2018) 30 tablet 1  . traZODone (DESYREL) 100 MG tablet Take 1 tablet (100 mg total) by mouth at bedtime. (Patient not taking: Reported on 05/09/2018) 30 tablet 0   No current facility-administered medications for this visit.    Facility-Administered Medications Ordered in Other Visits  Medication Dose Route Frequency Provider Last Rate Last Dose  . 0.9 %  sodium chloride infusion   Intravenous Once Borders, Vonna Kotyk R, NP      . sodium chloride flush (NS) 0.9 % injection 10 mL  10 mL Intracatheter PRN Sindy Guadeloupe, MD        VITAL SIGNS: BP 118/74 (BP Location: Left Arm, Patient Position: Sitting)   Pulse 74   Temp 98.8 F (37.1 C) (Tympanic)   Resp 18   Ht 6' (1.829 m)   Wt 128 lb 12.8 oz (58.4 kg)   SpO2 95%   BMI 17.47 kg/m  Filed Weights   05/31/18 0904  Weight: 128 lb 12.8 oz (58.4 kg)    Estimated body mass index is 17.47 kg/m as calculated from the following:   Height as of this encounter: 6' (1.829 m).   Weight as of this encounter: 128 lb 12.8 oz (58.4 kg).  LABS: CBC:    Component Value Date/Time   WBC 8.3 05/24/2018 0940     HGB 10.5 (L) 05/24/2018 0940   HGB 10.9 (L) 10/27/2012 0427   HCT 31.0 (L) 05/24/2018 0940   HCT 31.9 (L) 10/27/2012 0427   PLT 246 05/24/2018 0940   PLT 191 10/27/2012 0427   MCV 95.4 05/24/2018 0940   MCV 99 10/27/2012 0427   NEUTROABS 6.3 05/24/2018 0940   NEUTROABS 5.1 10/27/2012 0427   LYMPHSABS 1.3 05/24/2018 0940   LYMPHSABS 1.2 10/27/2012 0427   MONOABS 0.7 05/24/2018 0940   MONOABS 1.0 10/27/2012 0427   EOSABS 0.0 05/24/2018 0940   EOSABS 0.1 10/27/2012 0427   BASOSABS 0.0 05/24/2018 0940   BASOSABS 0.0 10/27/2012 0427   Comprehensive Metabolic Panel:    Component Value Date/Time   NA 127 (L) 05/24/2018 0940   NA 131 (L) 10/27/2012 0427   K 2.9 (L) 05/24/2018 0940  K 3.1 (L) 10/27/2012 0427   CL 89 (L) 05/24/2018 0940   CL 94 (L) 10/27/2012 0427   CO2 29 05/24/2018 0940   CO2 29 10/27/2012 0427   BUN 7 (L) 05/24/2018 0940   BUN 9 10/27/2012 0427   CREATININE 0.49 (L) 05/24/2018 0940   CREATININE 0.74 10/27/2012 0427   GLUCOSE 113 (H) 05/24/2018 0940   GLUCOSE 157 (H) 10/27/2012 0427   CALCIUM 8.5 (L) 05/24/2018 0940   CALCIUM 8.0 (L) 10/27/2012 0427   AST 23 05/24/2018 0940   AST 134 (H) 10/22/2012 0501   ALT 17 05/24/2018 0940   ALT 202 (H) 10/22/2012 0501   ALKPHOS 197 (H) 05/24/2018 0940   ALKPHOS 65 10/22/2012 0501   BILITOT 2.3 (H) 05/24/2018 0940   BILITOT 1.5 (H) 10/22/2012 0501   PROT 6.4 (L) 05/24/2018 0940   PROT 6.7 10/22/2012 0501   ALBUMIN 2.7 (L) 05/24/2018 0940   ALBUMIN 3.5 10/22/2012 0501    RADIOGRAPHIC STUDIES: US Venous Img Lower Bilateral  Result Date: 05/04/2018 CLINICAL DATA:  Swelling and fevers EXAM: BILATERAL LOWER EXTREMITY VENOUS DOPPLER ULTRASOUND TECHNIQUE: Gray-scale sonography with graded compression, as well as color Doppler and duplex ultrasound were performed to evaluate the lower extremity deep venous systems from the level of the common femoral vein and including the common femoral, femoral, profunda femoral,  popliteal and calf veins including the posterior tibial, peroneal and gastrocnemius veins when visible. The superficial great saphenous vein was also interrogated. Spectral Doppler was utilized to evaluate flow at rest and with distal augmentation maneuvers in the common femoral, femoral and popliteal veins. COMPARISON:  None. FINDINGS: RIGHT LOWER EXTREMITY Common Femoral Vein: No evidence of thrombus. Normal compressibility, respiratory phasicity and response to augmentation. Saphenofemoral Junction: No evidence of thrombus. Normal compressibility and flow on color Doppler imaging. Profunda Femoral Vein: No evidence of thrombus. Normal compressibility and flow on color Doppler imaging. Femoral Vein: No evidence of thrombus. Normal compressibility, respiratory phasicity and response to augmentation. Popliteal Vein: No evidence of thrombus. Normal compressibility, respiratory phasicity and response to augmentation. Calf Veins: No evidence of thrombus. Normal compressibility and flow on color Doppler imaging. Superficial Great Saphenous Vein: No evidence of thrombus. Normal compressibility. Venous Reflux:  None. Other Findings: Better visualized on the current exam is a popliteal cyst medially. LEFT LOWER EXTREMITY Common Femoral Vein: No evidence of thrombus. Normal compressibility, respiratory phasicity and response to augmentation. Saphenofemoral Junction: No evidence of thrombus. Normal compressibility and flow on color Doppler imaging. Profunda Femoral Vein: No evidence of thrombus. Normal compressibility and flow on color Doppler imaging. Femoral Vein: No evidence of thrombus. Normal compressibility, respiratory phasicity and response to augmentation. Popliteal Vein: No evidence of thrombus. Normal compressibility, respiratory phasicity and response to augmentation. Calf Veins: No evidence of thrombus. Normal compressibility and flow on color Doppler imaging. Superficial Great Saphenous Vein: No evidence of  thrombus. Normal compressibility. Venous Reflux:  None. Other Findings: Better seen on the current exam is a popliteal cyst medially. IMPRESSION: No evidence of deep venous thrombosis bilaterally. Better visualized on today's exam are bilateral popliteal cysts Electronically Signed   By: Inez Catalina M.D.   On: 05/04/2018 14:31   Dg Chest Portable 1 View  Result Date: 05/03/2018 CLINICAL DATA:  Altered mental status.  Weakness. EXAM: PORTABLE CHEST 1 VIEW COMPARISON:  CT 04/26/2018, CXR 10/26/2012 FINDINGS: Redemonstration of a nodular density projecting over the right mid lung laterally. Mild emphysematous hyperinflation of the lungs with chronic interstitial change. No alveolar confluence,  pulmonary edema, effusion or pneumothorax. Dextroconvex curvature of the mid to upper thoracic spine is redemonstrated. Heart and mediastinal contours are within normal limits with mild-to-moderate aortic atherosclerosis noted at the arch. No aneurysm is identified. No suspicious nor aggressive osseous lesions. IMPRESSION: 1. Emphysematous hyperinflation of the lungs with peripheral right midlung nodular density noted as documented on recent CT. 2. Chronic interstitial change of the lungs without alveolar confluence, edema, effusion or pneumothorax. Electronically Signed   By: Ashley Royalty M.D.   On: 05/03/2018 20:13    PERFORMANCE STATUS (ECOG) : 2 - Symptomatic, <50% confined to bed  Review of Systems As noted above. Otherwise, a complete review of systems is negative.  Physical Exam General: NAD, frail appearing, thin, in chair Cardiovascular: regular rate and rhythm Pulmonary: clear ant fields Abdomen: soft, nontender, + bowel sounds GU: no suprapubic tenderness Extremities: trace pedal edema Skin: no rashes Neurological: Weakness but otherwise nonfocal  IMPRESSION: I met with patient, wife, and daughter in the clinic today. Patient reports feeling better without any acute changes or concerns. He feels  pain is stable on oxycodone. He is typically only requiring 1 oxycodone at bedtime. Insomnia persists. However, patient and family provide conflicting reports on the severity. Patient is taking two lorazepam 0.80m tablets at bedtime and feels this does help initiate sleep. He says he is averaging about six hours of sleep each night. We discussed proper sleep hygiene practices. I encouraged him to avoid daytime napping. We discussed alternative medications for insomnia but family would like to continue lorazepam but switch to 191mtablets. Will prescribe.  Patient denies depression or significant anxiety. He feels he is coping well with his illness. However, his family say that he has been increasingly "grumpy." Patient not currently interested in an antidepressant. However, starting one in the future could be considered for moods/anxiety. Mirtazapine could be considered, which might help moods and have slight added benefit to insomnia and appetite.   Weight slightly increased today (up to 128lbs from 123 ls on 11/1). Patient feels like his oral intake is improved. I encouraged him to continue the oral supplements BID-TID and to focus on high-calorie and high-protein foods.   We again reviewed patient's wishes for advance care planning and end of life care. He says he had considered reversing his DNR. However, we talked through these issues and he confirms that he would not want to be resuscitated or be placed on life support. He verbalized concern that "DNR" meant that he would not be treated. I clarified that he could still receive treatment and hospitalization with a DNR order in place. His family verbalized agreement with these decisions.   I completed a MOST form today. The patient and family outlined their wishes for the following treatment decisions:  Cardiopulmonary Resuscitation: Do Not Attempt Resuscitation (DNR/No CPR)  Medical Interventions: Limited Additional Interventions: Use medical  treatment, IV fluids and cardiac monitoring as indicated, DO NOT USE intubation or mechanical ventilation. May consider use of less invasive airway support such as BiPAP or CPAP. Also provide comfort measures. Transfer to the hospital if indicated. Avoid intensive care.   Antibiotics: Antibiotics if indicated  IV Fluids: IV fluids if indicated  Feeding Tube: No feeding tube   Patient is taking novolog 5units at mealtime if CBG is greater than 150. Daughter says that patient's insurance will not cover Novolog but would cover Humalog. They ask for a prescription for Humalog. Spoke with patient's [facility] pharmacist to confirm the order/dosing.   PLAN: MOST  form completed as outlined above DNR  Lorazepam 76m PO q8h prn for anxiety/sleep, #30, no refills Humalog Kwikpen 5 units SQ at mealtime if blood glucose is greater than 150 IV fluids today CBC, CMET checked RTC in 2-3 weeks  Patient expressed understanding and was in agreement with this plan. He also understands that He can call clinic at any time with any questions, concerns, or complaints.   Time Total: 45 minutes  Visit consisted of counseling and education dealing with the complex and emotionally intense issues of symptom management and palliative care in the setting of serious and potentially life-threatening illness.Greater than 50%  of this time was spent counseling and coordinating care related to the above assessment and plan.  Signed by: JAltha Harm DGalien NP-C, [place](Work Cell)

## 2018-06-01 ENCOUNTER — Other Ambulatory Visit: Payer: Self-pay | Admitting: Oncology

## 2018-06-02 ENCOUNTER — Other Ambulatory Visit: Payer: Self-pay | Admitting: Oncology

## 2018-06-03 ENCOUNTER — Other Ambulatory Visit: Payer: Self-pay | Admitting: Oncology

## 2018-06-03 ENCOUNTER — Telehealth: Payer: Self-pay

## 2018-06-03 NOTE — Telephone Encounter (Signed)
Nutrition  Patient returned RD call for nutrition follow-up.    Reports that he is eating well.  "I eat all the time."  Reports that he is trying to drink 2-3 ensure per day.  Reports some nausea but controlled with medication.  Denies diarrhea or constipation.  Denies problems chewing or swallowing.  "I am going to eat barbecue, beans and mashed potatoes tonight for supper.    Reports that he has had company all afternoon.    Weight 128 lb 12.8 oz on 11/8 noted (increased).  Patient reports on home scale 124 lb today.   Intervention: Reviewed strategies to increase calories and protein.   Encouraged 350 calorie ensure enlive, boost plus or ensure plus 2-3 times per day vs glucerna (180 calories) Will provide another case for patient to pick up on Friday 11/15   Next visit: phone call in about 3-4 weeks  Kynsley Whitehouse B. Zenia Resides, [place], [place] Registered Dietitian [phone] (pager)

## 2018-06-03 NOTE — Telephone Encounter (Signed)
   Ref Range & Units 3 d ago  Potassium 3.5 - 5.1 mmol/L 3.2Low          

## 2018-06-03 NOTE — Telephone Encounter (Signed)
Nutrition  Called patient earlier today (1:30pm) for nutrition follow up and asked that RD call back later this pm as he had company visiting.  Reported that he is eating some better.  RD called back just now and left message on patient's voicemail.  Bryan Hicks B. Bryan Hicks, [place], [place] Registered Dietitian [phone] (pager)

## 2018-06-06 ENCOUNTER — Emergency Department: Payer: Medicare Other

## 2018-06-06 ENCOUNTER — Other Ambulatory Visit: Payer: Self-pay

## 2018-06-06 ENCOUNTER — Telehealth: Payer: Self-pay | Admitting: *Deleted

## 2018-06-06 ENCOUNTER — Emergency Department
Admission: EM | Admit: 2018-06-06 | Discharge: 2018-06-06 | Disposition: A | Discharge status: Home / Self Care | Payer: Medicare Other | Attending: Student in an Organized Health Care Education/Training Program | Admitting: Student in an Organized Health Care Education/Training Program

## 2018-06-06 ENCOUNTER — Encounter: Payer: Self-pay | Admitting: Emergency Medicine

## 2018-06-06 DIAGNOSIS — J449 Chronic obstructive pulmonary disease, unspecified: Secondary | ICD-10-CM | POA: Diagnosis not present

## 2018-06-06 DIAGNOSIS — C259 Malignant neoplasm of pancreas, unspecified: Secondary | ICD-10-CM | POA: Diagnosis not present

## 2018-06-06 DIAGNOSIS — E1122 Type 2 diabetes mellitus with diabetic chronic kidney disease: Secondary | ICD-10-CM | POA: Insufficient documentation

## 2018-06-06 DIAGNOSIS — I129 Hypertensive chronic kidney disease with stage 1 through stage 4 chronic kidney disease, or unspecified chronic kidney disease: Secondary | ICD-10-CM | POA: Insufficient documentation

## 2018-06-06 DIAGNOSIS — F17200 Nicotine dependence, unspecified, uncomplicated: Secondary | ICD-10-CM | POA: Insufficient documentation

## 2018-06-06 DIAGNOSIS — E1165 Type 2 diabetes mellitus with hyperglycemia: Secondary | ICD-10-CM | POA: Insufficient documentation

## 2018-06-06 DIAGNOSIS — N181 Chronic kidney disease, stage 1: Secondary | ICD-10-CM | POA: Diagnosis not present

## 2018-06-06 DIAGNOSIS — Z79899 Other long term (current) drug therapy: Secondary | ICD-10-CM | POA: Diagnosis not present

## 2018-06-06 DIAGNOSIS — R410 Disorientation, unspecified: Secondary | ICD-10-CM | POA: Insufficient documentation

## 2018-06-06 DIAGNOSIS — R739 Hyperglycemia, unspecified: Secondary | ICD-10-CM

## 2018-06-06 DIAGNOSIS — Z794 Long term (current) use of insulin: Secondary | ICD-10-CM | POA: Diagnosis not present

## 2018-06-06 LAB — BLOOD GAS, VENOUS
Acid-Base Excess: 9.7 mmol/L — ABNORMAL HIGH (ref 0.0–2.0)
BICARBONATE: 35.3 mmol/L — AB (ref 20.0–28.0)
FIO2: 0.21
O2 Saturation: 68.3 %
PH VEN: 7.44 — AB (ref 7.250–7.430)
Patient temperature: 37
pCO2, Ven: 52 mmHg (ref 44.0–60.0)
pO2, Ven: 34 mmHg (ref 32.0–45.0)

## 2018-06-06 LAB — CBC WITH DIFFERENTIAL/PLATELET
ABS IMMATURE GRANULOCYTES: 0.22 10*3/uL — AB (ref 0.00–0.07)
Basophils Absolute: 0 10*3/uL (ref 0.0–0.1)
Basophils Relative: 1 %
Eosinophils Absolute: 0 10*3/uL (ref 0.0–0.5)
Eosinophils Relative: 0 %
HEMATOCRIT: 29.1 % — AB (ref 39.0–52.0)
HEMOGLOBIN: 9.8 g/dL — AB (ref 13.0–17.0)
IMMATURE GRANULOCYTES: 3 %
LYMPHS PCT: 24 %
Lymphs Abs: 2.1 10*3/uL (ref 0.7–4.0)
MCH: 32 pg (ref 26.0–34.0)
MCHC: 33.7 g/dL (ref 30.0–36.0)
MCV: 95.1 fL (ref 80.0–100.0)
MONO ABS: 1.2 10*3/uL — AB (ref 0.1–1.0)
MONOS PCT: 14 %
NEUTROS ABS: 5 10*3/uL (ref 1.7–7.7)
Neutrophils Relative %: 58 %
PLATELETS: 695 10*3/uL — AB (ref 150–400)
RBC: 3.06 MIL/uL — ABNORMAL LOW (ref 4.22–5.81)
RDW: 14.7 % (ref 11.5–15.5)
WBC: 8.6 10*3/uL (ref 4.0–10.5)
nRBC: 0 % (ref 0.0–0.2)

## 2018-06-06 LAB — URINALYSIS, COMPLETE (UACMP) WITH MICROSCOPIC
BACTERIA UA: NONE SEEN
Bilirubin Urine: NEGATIVE
Glucose, UA: >=500 mg/dL — AB
Hgb urine dipstick: NEGATIVE
KETONES UR: NEGATIVE mg/dL
LEUKOCYTES UA: NEGATIVE
Nitrite: NEGATIVE
PH: 7 (ref 5.0–8.0)
PROTEIN: NEGATIVE mg/dL
Specific Gravity, Urine: 1.025 (ref 1.005–1.030)

## 2018-06-06 LAB — GLUCOSE, CAPILLARY
GLUCOSE-CAPILLARY: 334 mg/dL — AB (ref 70–99)
GLUCOSE-CAPILLARY: 153 mg/dL — AB (ref 70–99)
Glucose-Capillary: 438 mg/dL — ABNORMAL HIGH (ref 70–99)

## 2018-06-06 LAB — COMPREHENSIVE METABOLIC PANEL
ALT: 19 U/L (ref 0–44)
AST: 21 U/L (ref 15–41)
Albumin: 2.8 g/dL — ABNORMAL LOW (ref 3.5–5.0)
Alkaline Phosphatase: 181 U/L — ABNORMAL HIGH (ref 38–126)
Anion gap: 10 (ref 5–15)
BILIRUBIN TOTAL: 1.3 mg/dL — AB (ref 0.3–1.2)
BUN: 14 mg/dL (ref 8–23)
CHLORIDE: 85 mmol/L — AB (ref 98–111)
CO2: 31 mmol/L (ref 22–32)
CREATININE: 0.45 mg/dL — AB (ref 0.61–1.24)
Calcium: 8.5 mg/dL — ABNORMAL LOW (ref 8.9–10.3)
GFR calc Af Amer: >60 mL/min (ref >60)
Glucose, Bld: 416 mg/dL — ABNORMAL HIGH (ref 70–99)
Potassium: 4.2 mmol/L (ref 3.5–5.1)
Sodium: 126 mmol/L — ABNORMAL LOW (ref 135–145)
Total Protein: 6.6 g/dL (ref 6.5–8.1)

## 2018-06-06 LAB — LIPASE, BLOOD: LIPASE: 46 U/L (ref 11–51)

## 2018-06-06 LAB — AMMONIA: Ammonia: 21 umol/L (ref 9–35)

## 2018-06-06 MED ORDER — HEPARIN SOD (PORK) LOCK FLUSH 10 UNIT/ML IV SOLN
INTRAVENOUS | Status: AC
  Administered 2018-06-06: 21:00:00 via INTRAVENOUS
  Filled 2018-06-06: qty 1

## 2018-06-06 MED ORDER — GADOBUTROL 1 MMOL/ML IV SOLN
5.5000 mL | Freq: Once | INTRAVENOUS | Status: AC | PRN
  Administered 2018-06-06: 5.5 mL via INTRAVENOUS

## 2018-06-06 MED ORDER — INSULIN ASPART 100 UNIT/ML ~~LOC~~ SOLN
8.0000 [IU] | Freq: Once | SUBCUTANEOUS | Status: AC
  Administered 2018-06-06: 8 [IU] via INTRAVENOUS
  Filled 2018-06-06: qty 1

## 2018-06-06 MED ORDER — SODIUM CHLORIDE 0.9 % IV BOLUS
1000.0000 mL | Freq: Once | INTRAVENOUS | Status: AC
  Administered 2018-06-06: 1000 mL via INTRAVENOUS

## 2018-06-06 NOTE — ED Provider Notes (Signed)
[facility] Emergency Department Provider Note    First MD Initiated Contact with Patient 06/06/18 1539     (approximate)  I have reviewed the triage vital signs and the nursing notes.   HISTORY  Chief Complaint Hyperglycemia    HPI Bryan Hicks is a 77 y.o. male with a history of COPD as well as pancreatic cancer undergoing chemotherapy presents to the ER states his appetite is been good but he is always feeling hungry.  Denies any pain.  No shortness of breath.  Daughter at bedside with him took me aside and states that family knows that he is terminal and I have recommended palliative care but he and his wife are not ready for that.  Apparently becomes very frustrated when he is forgetful or more confused but this is becoming more of an issue over the past week or so.  Chemotherapy was 2 weeks ago.    Past Medical History:  Diagnosis Date  . COPD (chronic obstructive pulmonary disease) ([place])   . Diabetes mellitus without complication ([place])   . Hypertension   . Shortness of breath dyspnea    Family History  Problem Relation Age of Onset  . Colon cancer Mother   . Melanoma Father    Past Surgical History:  Procedure Laterality Date  . COLON SURGERY     "twisted intestine"  . ERCP N/A 04/26/2018   Procedure: ENDOSCOPIC RETROGRADE CHOLANGIOPANCREATOGRAPHY (ERCP);  Surgeon: Lucilla Lame, MD;  Location: [facility] END[facility]COPY;  Service: Endoscopy;  Laterality: N/A;  . HERNIA MESH REMOVAL Right   . INGUINAL HERNIA REPAIR Left 12/04/2014   Procedure: HERNIA REPAIR INGUINAL ADULT;  Surgeon: Rochel Brome, MD;  Location: [facility] ORS;  Service: General;  Laterality: Left;  . PORTA CATH INSERTION N/A 05/16/2018   Procedure: PORTA CATH INSERTION;  Surgeon: Algernon Huxley, MD;  Location: [place] CV LAB;  Service: Cardiovascular;  Laterality: N/A;   Patient Active Problem List   Diagnosis Date Noted  . Malignant neoplasm of pancreas ([place]) 05/13/2018  .  Hypoglycemia 05/03/2018  . Pancreatic mass 04/26/2018  . Protein-calorie malnutrition, severe 04/26/2018  . Neoplasm of digestive system   . Obstruction of bile duct   . Other specified diseases of biliary tract   . Mild protein-calorie malnutrition ([place]) 01/08/2018  . Pernicious anemia 10/07/2014  . COPD (chronic obstructive pulmonary disease) ([place]) 12/27/2013  . HTN (hypertension), benign 12/27/2013  . Hyperlipidemia associated with type 2 diabetes mellitus ([place]) 12/27/2013  . Type 2 diabetes mellitus with stage 1 chronic kidney disease, without long-term current use of insulin ([place]) 12/27/2013      Prior to Admission medications   Medication Sig Start Date End Date Taking? Authorizing Provider  Fluticasone-Salmeterol (ADVAIR) 250-50 MCG/DOSE AEPB Inhale 1 puff into the lungs 2 (two) times daily.    Yes [provider]  insulin aspart (NOVOLOG) 100 UNIT/ML injection 5 units subcutaneous prior to meals if sugars greater than 150 05/05/18  Yes Wieting, Richard, MD  LANTUS SOLOSTAR 100 UNIT/ML Solostar Pen 6 units suncutanous injection daily 05/05/18  Yes Wieting, Richard, MD  LORazepam (ATIVAN) 1 MG tablet Take 1 tablet (1 mg total) by mouth every 8 (eight) hours as needed for anxiety or sleep. 05/31/18  Yes Borders, Kirt Boys, NP  oxyCODONE (OXY IR/ROXICODONE) 5 MG immediate release tablet Take 1 tablet (5 mg total) by mouth every 8 (eight) hours as needed for severe pain. 05/20/18 06/19/18 Yes Sindy Guadeloupe, MD  potassium chloride 20 MEQ/15ML (  10%) SOLN TAKE 15 MLS BY MOUTH EVERY DAY FOR 4 DAYS Patient taking differently: Take 20 mEq by mouth daily.  06/03/18  Yes Sindy Guadeloupe, MD  vitamin B-12 (CYANOCOBALAMIN) 1000 MCG tablet Take 1,000 mcg by mouth daily.   Yes [provider]  docusate sodium (COLACE) 100 MG capsule Take 1 capsule (100 mg total) by mouth daily as needed for mild constipation. Patient not taking: Reported on 05/09/2018 05/05/18   Loletha Grayer, MD   insulin lispro (HUMALOG KWIKPEN) 100 UNIT/ML KwikPen Use 5 units at mealtime if blood glucose is > 150 05/31/18   Borders, Kirt Boys, NP  levofloxacin (LEVAQUIN) 500 MG tablet Take 1 tablet (500 mg total) by mouth daily. Patient not taking: Reported on 05/17/2018 05/05/18   Loletha Grayer, MD  lidocaine-prilocaine (EMLA) cream Apply to affected area once Patient not taking: Reported on 05/17/2018 05/13/18   Sindy Guadeloupe, MD  Melatonin 5 MG TABS Take 1 tablet (5 mg total) by mouth at bedtime. Patient not taking: Reported on 05/09/2018 05/05/18   Loletha Grayer, MD  ondansetron (ZOFRAN) 8 MG tablet Take 1 tablet (8 mg total) by mouth 2 (two) times daily as needed (Nausea or vomiting). Patient not taking: Reported on 05/17/2018 05/13/18   Sindy Guadeloupe, MD  potassium chloride SA (K-DUR,KLOR-CON) 20 MEQ tablet Take 1 tablet (20 mEq total) by mouth daily. 05/05/18   Loletha Grayer, MD  prochlorperazine (COMPAZINE) 10 MG tablet Take 1 tablet (10 mg total) by mouth every 6 (six) hours as needed (Nausea or vomiting). Patient not taking: Reported on 05/17/2018 05/13/18   Sindy Guadeloupe, MD  traZODone (DESYREL) 100 MG tablet Take 1 tablet (100 mg total) by mouth at bedtime. Patient not taking: Reported on 05/09/2018 05/05/18   Loletha Grayer, MD    Allergies Patient has no known allergies.    Social History Social History   Tobacco Use  . Smoking status: Current Every Day Smoker    Packs/day: 1.00    Years: 30.00    Pack years: 30.00  . Smokeless tobacco: Former Network engineer Use Topics  . Alcohol use: No  . Drug use: No    Review of Systems Patient denies headaches, rhinorrhea, blurry vision, numbness, shortness of breath, chest pain, edema, cough, abdominal pain, nausea, vomiting, diarrhea, dysuria, fevers, rashes or hallucinations unless otherwise stated above in HPI. ____________________________________________   PHYSICAL EXAM:  VITAL SIGNS: Vitals:   06/06/18 1830  06/06/18 2032  BP: 123/84 139/83  Pulse: 93   Resp: (!) 24 16  Temp:    SpO2: 97%     Constitutional: Alert and oriented but very frail and ill appearing.  Eyes: Conjunctivae are normal.  Head: Atraumatic. Nose: No congestion/rhinnorhea. Mouth/Throat: Mucous membranes are moist.   Neck: No stridor. Painless ROM.  Cardiovascular: Normal rate, regular rhythm. Grossly normal heart sounds.  Good peripheral circulation. Respiratory: Normal respiratory effort.  No retractions. Lungs CTAB. Gastrointestinal: Soft and nontender. No distention. No abdominal bruits. No CVA tenderness. Genitourinary:  Musculoskeletal: No lower extremity tenderness nor edema.  No joint effusions. Neurologic:  Normal speech and language. No gross focal neurologic deficits are appreciated. No facial droop Skin:  Skin is warm, dry and intact. No rash noted. Psychiatric: Mood and affect are normal. Speech and behavior are normal.  ____________________________________________   LABS (all labs ordered are listed, but only abnormal results are displayed)  Results for orders placed or performed during the hospital encounter of 06/06/18 (from the past 24 hour(s))  Glucose, capillary     Status: Abnormal   Collection Time: 06/06/18  3:35 PM  Result Value Ref Range   Glucose-Capillary 438 (H) 70 - 99 mg/dL  CBC with Differential/Platelet     Status: Abnormal   Collection Time: 06/06/18  3:45 PM  Result Value Ref Range   WBC 8.6 4.0 - 10.5 K/uL   RBC 3.06 (L) 4.22 - 5.81 MIL/uL   Hemoglobin 9.8 (L) 13.0 - 17.0 g/dL   HCT 29.1 (L) 39.0 - 52.0 %   MCV 95.1 80.0 - 100.0 fL   MCH 32.0 26.0 - 34.0 pg   MCHC 33.7 30.0 - 36.0 g/dL   RDW 14.7 11.5 - 15.5 %   Platelets 695 (H) 150 - 400 K/uL   nRBC 0.0 0.0 - 0.2 %   Neutrophils Relative % 58 %   Neutro Abs 5.0 1.7 - 7.7 K/uL   Lymphocytes Relative 24 %   Lymphs Abs 2.1 0.7 - 4.0 K/uL   Monocytes Relative 14 %   Monocytes Absolute 1.2 (H) 0.1 - 1.0 K/uL    Eosinophils Relative 0 %   Eosinophils Absolute 0.0 0.0 - 0.5 K/uL   Basophils Relative 1 %   Basophils Absolute 0.0 0.0 - 0.1 K/uL   WBC Morphology MORPHOLOGY UNREMARKABLE    Immature Granulocytes 3 %   Abs Immature Granulocytes 0.22 (H) 0.00 - 0.07 K/uL  Comprehensive metabolic panel     Status: Abnormal   Collection Time: 06/06/18  3:45 PM  Result Value Ref Range   Sodium 126 (L) 135 - 145 mmol/L   Potassium 4.2 3.5 - 5.1 mmol/L   Chloride 85 (L) 98 - 111 mmol/L   CO2 31 22 - 32 mmol/L   Glucose, Bld 416 (H) 70 - 99 mg/dL   BUN 14 8 - 23 mg/dL   Creatinine, Ser 0.45 (L) 0.61 - 1.24 mg/dL   Calcium 8.5 (L) 8.9 - 10.3 mg/dL   Total Protein 6.6 6.5 - 8.1 g/dL   Albumin 2.8 (L) 3.5 - 5.0 g/dL   AST 21 15 - 41 U/L   ALT 19 0 - 44 U/L   Alkaline Phosphatase 181 (H) 38 - 126 U/L   Total Bilirubin 1.3 (H) 0.3 - 1.2 mg/dL   GFR calc non Af Amer >60 >60 mL/min   GFR calc Af Amer >60 >60 mL/min   Anion gap 10 5 - 15  Lipase, blood     Status: None   Collection Time: 06/06/18  3:45 PM  Result Value Ref Range   Lipase 46 11 - 51 U/L  Ammonia     Status: None   Collection Time: 06/06/18  3:45 PM  Result Value Ref Range   Ammonia 21 9 - 35 umol/L  Blood gas, venous     Status: Abnormal   Collection Time: 06/06/18  3:47 PM  Result Value Ref Range   FIO2 0.21    pH, Ven 7.44 (H) 7.250 - 7.430   pCO2, Ven 52 44.0 - 60.0 mmHg   pO2, Ven 34.0 32.0 - 45.0 mmHg   Bicarbonate 35.3 (H) 20.0 - 28.0 mmol/L   Acid-Base Excess 9.7 (H) 0.0 - 2.0 mmol/L   O2 Saturation 68.3 %   Patient temperature 37.0    Collection site VEIN    Sample type VEIN   Urinalysis, Complete w Microscopic     Status: Abnormal   Collection Time: 06/06/18  5:09 PM  Result Value Ref Range   Color, Urine YELLOW (  A) YELLOW   APPearance CLEAR (A) CLEAR   Specific Gravity, Urine 1.025 1.005 - 1.030   pH 7.0 5.0 - 8.0   Glucose, UA >=500 (A) NEGATIVE mg/dL   Hgb urine dipstick NEGATIVE NEGATIVE   Bilirubin Urine  NEGATIVE NEGATIVE   Ketones, ur NEGATIVE NEGATIVE mg/dL   Protein, ur NEGATIVE NEGATIVE mg/dL   Nitrite NEGATIVE NEGATIVE   Leukocytes, UA NEGATIVE NEGATIVE   RBC / HPF 0-5 0 - 5 RBC/hpf   WBC, UA 0-5 0 - 5 WBC/hpf   Bacteria, UA NONE SEEN NONE SEEN   Squamous Epithelial / LPF 0-5 0 - 5  Glucose, capillary     Status: Abnormal   Collection Time: 06/06/18  5:16 PM  Result Value Ref Range   Glucose-Capillary 334 (H) 70 - 99 mg/dL  Glucose, capillary     Status: Abnormal   Collection Time: 06/06/18  6:41 PM  Result Value Ref Range   Glucose-Capillary 153 (H) 70 - 99 mg/dL   ____________________________________________   ____________________________________________  RADIOLOGY  I personally reviewed all radiographic images ordered to evaluate for the above acute complaints and reviewed radiology reports and findings.  These findings were personally discussed with the patient.  Please see medical record for radiology report.  ____________________________________________   PROCEDURES  Procedure(s) performed:  Procedures    Critical Care performed: no ____________________________________________   INITIAL IMPRESSION / ASSESSMENT AND PLAN / ED COURSE  Pertinent labs & imaging results that were available during my care of the patient were reviewed by me and considered in my medical decision making (see chart for details).   DDX: Dehydration, mass, sepsis, pna, uti, hypo/hyperglycemia, dka, drug effect, withdrawal, encephalitis   Bryan Hicks is a 77 y.o. who presents to the ED with symptoms as described above.  We have IV fluids.  Her blood work.  Is nontoxic-appearing but certainly frail given his underlying condition.  The patient will be placed on continuous pulse oximetry and telemetry for monitoring.  Laboratory evaluation will be sent to evaluate for the above complaints.     Clinical Course as of Jun 06 2304  Thu Jun 06, 2018  1545 Glucose, capillary(!) [PR]    1639 Blood work thus far is fairly reassuring.  Patient does have chronic hyponatremia.  Receiving IV fluids for this.  Ammonia level was normal.  Will order CT head to exclude edema or mass lesion.   [PR]  1854 Patients glucose improving.  He is requesting DC home.  I have encouraged him to stay for mri to exclude mets.   [PR]  2024 MRI brain does not show any evidence of metastasis or stroke.  Patient feels much improved after IV hydration.  Again he is requesting discharge home.  Again recommended hospitalization for IV fluids patient feels well and states that he is ready to go home to eat a "steak dinner ".  At this point I think that is a reasonable option given work-up thus far.   [PR]    Clinical Course User Index [PR] Merlyn Lot, MD     As part of my medical decision making, I reviewed the following data within the [place] notes reviewed and incorporated, Labs reviewed, notes from prior ED visits.   ____________________________________________   FINAL CLINICAL IMPRESSION(S) / ED DIAGNOSES  Final diagnoses:  Hyperglycemia  Confusion      NEW MEDICATIONS STARTED DURING THIS VISIT:  Discharge Medication List as of 06/06/2018  8:25 PM  Note:  This document was prepared using Dragon voice recognition software and may include unintentional dictation errors.    Merlyn Lot, MD 06/06/18 [phone]

## 2018-06-06 NOTE — Telephone Encounter (Signed)
I called ER to let them know to expect him

## 2018-06-06 NOTE — ED Notes (Signed)
Pt back to room at this time

## 2018-06-06 NOTE — ED Triage Notes (Signed)
Pt to ED from home with c/o "high" reading at home for glucose. PT hx of pancreatic cancer , last chemo x2wks ago. Per family pt confused. PT A&OX4. cbg 438 at this time

## 2018-06-06 NOTE — Discharge Instructions (Addendum)
Be sure to discuss additional insulin and blood sugar management with Dr. Janese Banks tomorrow.  Be sure to drink plenty of fluids.  Return to the ER for additional questions or concerns.

## 2018-06-06 NOTE — Telephone Encounter (Signed)
Bryan Hicks called to report that her fathers blood sugar is high, the actual reading on the machine is "high" he is confused and forgetful. I discussed with Burna Sis, RN and she was advised to take him to the ER for evaluation. She stated "That's what I thought", I will get him over there

## 2018-06-07 ENCOUNTER — Emergency Department
Admission: EM | Admit: 2018-06-07 | Discharge: 2018-06-07 | Disposition: A | Discharge status: Home / Self Care | Payer: Medicare Other | Attending: Emergency Medicine | Admitting: Emergency Medicine

## 2018-06-07 ENCOUNTER — Inpatient Hospital Stay: Payer: Medicare Other

## 2018-06-07 ENCOUNTER — Other Ambulatory Visit: Payer: Self-pay

## 2018-06-07 ENCOUNTER — Inpatient Hospital Stay (HOSPITAL_BASED_OUTPATIENT_CLINIC_OR_DEPARTMENT_OTHER): Payer: Medicare Other | Admitting: Oncology

## 2018-06-07 ENCOUNTER — Encounter: Payer: Self-pay | Admitting: Oncology

## 2018-06-07 ENCOUNTER — Encounter: Payer: Self-pay | Admitting: Emergency Medicine

## 2018-06-07 VITALS — BP 131/80 | HR 104 | Temp 97.5°F | Resp 18 | Ht 72.0 in | Wt 124.4 lb

## 2018-06-07 DIAGNOSIS — E876 Hypokalemia: Secondary | ICD-10-CM | POA: Diagnosis not present

## 2018-06-07 DIAGNOSIS — C259 Malignant neoplasm of pancreas, unspecified: Secondary | ICD-10-CM | POA: Diagnosis not present

## 2018-06-07 DIAGNOSIS — F17210 Nicotine dependence, cigarettes, uncomplicated: Secondary | ICD-10-CM

## 2018-06-07 DIAGNOSIS — C779 Secondary and unspecified malignant neoplasm of lymph node, unspecified: Secondary | ICD-10-CM

## 2018-06-07 DIAGNOSIS — J449 Chronic obstructive pulmonary disease, unspecified: Secondary | ICD-10-CM | POA: Insufficient documentation

## 2018-06-07 DIAGNOSIS — Z79899 Other long term (current) drug therapy: Secondary | ICD-10-CM | POA: Diagnosis not present

## 2018-06-07 DIAGNOSIS — E1122 Type 2 diabetes mellitus with diabetic chronic kidney disease: Secondary | ICD-10-CM | POA: Diagnosis not present

## 2018-06-07 DIAGNOSIS — R739 Hyperglycemia, unspecified: Secondary | ICD-10-CM

## 2018-06-07 DIAGNOSIS — Z66 Do not resuscitate: Secondary | ICD-10-CM

## 2018-06-07 DIAGNOSIS — C250 Malignant neoplasm of head of pancreas: Secondary | ICD-10-CM

## 2018-06-07 DIAGNOSIS — Z9221 Personal history of antineoplastic chemotherapy: Secondary | ICD-10-CM | POA: Insufficient documentation

## 2018-06-07 DIAGNOSIS — Z794 Long term (current) use of insulin: Secondary | ICD-10-CM | POA: Insufficient documentation

## 2018-06-07 DIAGNOSIS — E1165 Type 2 diabetes mellitus with hyperglycemia: Secondary | ICD-10-CM | POA: Diagnosis not present

## 2018-06-07 DIAGNOSIS — Z5111 Encounter for antineoplastic chemotherapy: Secondary | ICD-10-CM | POA: Diagnosis not present

## 2018-06-07 DIAGNOSIS — N181 Chronic kidney disease, stage 1: Secondary | ICD-10-CM | POA: Insufficient documentation

## 2018-06-07 LAB — CBC WITH DIFFERENTIAL/PLATELET
Abs Immature Granulocytes: 0.25 10*3/uL — ABNORMAL HIGH (ref 0.00–0.07)
BASOS PCT: 1 %
Basophils Absolute: 0.1 10*3/uL (ref 0.0–0.1)
EOS ABS: 0 10*3/uL (ref 0.0–0.5)
Eosinophils Relative: 0 %
HEMATOCRIT: 30.7 % — AB (ref 39.0–52.0)
Hemoglobin: 10.1 g/dL — ABNORMAL LOW (ref 13.0–17.0)
Immature Granulocytes: 3 %
Lymphocytes Relative: 21 %
Lymphs Abs: 1.9 10*3/uL (ref 0.7–4.0)
MCH: 31.5 pg (ref 26.0–34.0)
MCHC: 32.9 g/dL (ref 30.0–36.0)
MCV: 95.6 fL (ref 80.0–100.0)
MONO ABS: 1.2 10*3/uL — AB (ref 0.1–1.0)
MONOS PCT: 13 %
Neutro Abs: 5.9 10*3/uL (ref 1.7–7.7)
Neutrophils Relative %: 62 %
Platelets: 845 10*3/uL — ABNORMAL HIGH (ref 150–400)
RBC: 3.21 MIL/uL — ABNORMAL LOW (ref 4.22–5.81)
RDW: 14.9 % (ref 11.5–15.5)
WBC: 9.4 10*3/uL (ref 4.0–10.5)
nRBC: 0 % (ref 0.0–0.2)

## 2018-06-07 LAB — URINALYSIS, COMPLETE (UACMP) WITH MICROSCOPIC
BACTERIA UA: NONE SEEN
BILIRUBIN URINE: NEGATIVE
Glucose, UA: >=500 mg/dL — AB
HGB URINE DIPSTICK: NEGATIVE
Ketones, ur: NEGATIVE mg/dL
Leukocytes, UA: NEGATIVE
NITRITE: NEGATIVE
Protein, ur: NEGATIVE mg/dL
SPECIFIC GRAVITY, URINE: 1.024 (ref 1.005–1.030)
Squamous Epithelial / LPF: NONE SEEN (ref 0–5)
pH: 7 (ref 5.0–8.0)

## 2018-06-07 LAB — COMPREHENSIVE METABOLIC PANEL
ALK PHOS: 178 U/L — AB (ref 38–126)
ALT: 18 U/L (ref 0–44)
ANION GAP: 10 (ref 5–15)
AST: 25 U/L (ref 15–41)
Albumin: 2.7 g/dL — ABNORMAL LOW (ref 3.5–5.0)
BILIRUBIN TOTAL: 1.1 mg/dL (ref 0.3–1.2)
BUN: 18 mg/dL (ref 8–23)
CALCIUM: 8.5 mg/dL — AB (ref 8.9–10.3)
CO2: 28 mmol/L (ref 22–32)
Chloride: 86 mmol/L — ABNORMAL LOW (ref 98–111)
Creatinine, Ser: 0.76 mg/dL (ref 0.61–1.24)
GFR calc Af Amer: >60 mL/min (ref >60)
GLUCOSE: 510 mg/dL — AB (ref 70–99)
Potassium: 4.3 mmol/L (ref 3.5–5.1)
Sodium: 124 mmol/L — ABNORMAL LOW (ref 135–145)
TOTAL PROTEIN: 6.8 g/dL (ref 6.5–8.1)

## 2018-06-07 LAB — GLUCOSE, CAPILLARY
GLUCOSE-CAPILLARY: 321 mg/dL — AB (ref 70–99)
Glucose-Capillary: 509 mg/dL (ref 70–99)

## 2018-06-07 MED ORDER — INSULIN ASPART 100 UNIT/ML ~~LOC~~ SOLN
10.0000 [IU] | Freq: Once | SUBCUTANEOUS | Status: AC
  Administered 2018-06-07: 10 [IU] via INTRAVENOUS
  Filled 2018-06-07: qty 1

## 2018-06-07 MED ORDER — SODIUM CHLORIDE 0.9% FLUSH
10.0000 mL | INTRAVENOUS | Status: DC | PRN
  Administered 2018-06-07: 10 mL via INTRAVENOUS
  Filled 2018-06-07: qty 10

## 2018-06-07 MED ORDER — HEPARIN SOD (PORK) LOCK FLUSH 100 UNIT/ML IV SOLN
500.0000 [IU] | Freq: Once | INTRAVENOUS | Status: AC
  Administered 2018-06-07: 500 [IU] via INTRAVENOUS
  Filled 2018-06-07: qty 5

## 2018-06-07 MED ORDER — HEPARIN SOD (PORK) LOCK FLUSH 100 UNIT/ML IV SOLN
500.0000 [IU] | Freq: Once | INTRAVENOUS | Status: DC
  Filled 2018-06-07: qty 5

## 2018-06-07 MED ORDER — SODIUM CHLORIDE 0.9 % IV BOLUS: 1000.0000 mL | Freq: Once | INTRAVENOUS | Status: DC

## 2018-06-07 MED ORDER — SODIUM CHLORIDE 0.9 % IV BOLUS
1000.0000 mL | Freq: Once | INTRAVENOUS | Status: AC
  Administered 2018-06-07: 1000 mL via INTRAVENOUS

## 2018-06-07 NOTE — ED Notes (Addendum)
Pt sitting upright in bed, talking with family member, states that he was here last night for the same thing, states that his blood sugar keeps going up and is uncertain why. No distress noted, cont to monitor

## 2018-06-07 NOTE — ED Notes (Addendum)
FIRST NURSE NOTE: Pt arrived via wheelchair from the [facility] with reports of [facility] of [facility], pt due for treatment, but unable to receive treatment due to elevated blood sugar.  Pt dropped off to [facility] by family member.

## 2018-06-07 NOTE — ED Notes (Signed)
Bryan Hammans, RN from cancer center states pt sees Dr. Ouida Sills.  She reports patient may need new order for sliding scale insulin and referral to endocrinologist.

## 2018-06-07 NOTE — ED Provider Notes (Signed)
[facility] Emergency Department Provider Note  ____________________________________________  Time seen: Approximately 12:42 PM  I have reviewed the triage vital signs and the nursing notes.   HISTORY  Chief Complaint Hyperglycemia   HPI Bryan Hicks is a 77 y.o. male with a history of pancreatic cancer currently undergoing chemotherapy, diabetes on insulin, COPD and hypertension who presents from oncology clinic for elevated blood glucose.  Patient reports that his sugars have been elevated over the last 2 to 3 days.  Patient reports taking 6 units of long-acting insulin every morning and 5 units with every meal for sugars greater than 150.  He is not on a sliding scale.  He denies any recent illness, no chest pain or shortness of breath, no dizziness, no dysuria, no fever or chills, no URI symptoms, no vomiting or diarrhea.  Today he went for his chemotherapy and he was found to have blood glucose of 510 and was sent to the emergency room for further management.   Past Medical History:  Diagnosis Date  . COPD (chronic obstructive pulmonary disease) ([place])   . Diabetes mellitus without complication ([place])   . Hypertension   . Shortness of breath dyspnea     Patient Active Problem List   Diagnosis Date Noted  . Malignant neoplasm of pancreas ([place]) 05/13/2018  . Hypoglycemia 05/03/2018  . Pancreatic mass 04/26/2018  . Protein-calorie malnutrition, severe 04/26/2018  . Neoplasm of digestive system   . Obstruction of bile duct   . Other specified diseases of biliary tract   . Mild protein-calorie malnutrition ([place]) 01/08/2018  . Pernicious anemia 10/07/2014  . COPD (chronic obstructive pulmonary disease) ([place]) 12/27/2013  . HTN (hypertension), benign 12/27/2013  . Hyperlipidemia associated with type 2 diabetes mellitus ([place]) 12/27/2013  . Type 2 diabetes mellitus with stage 1 chronic kidney disease, without long-term current use of insulin ([place])  12/27/2013    Past Surgical History:  Procedure Laterality Date  . COLON SURGERY     "twisted intestine"  . ERCP N/A 04/26/2018   Procedure: ENDOSCOPIC RETROGRADE CHOLANGIOPANCREATOGRAPHY (ERCP);  Surgeon: Lucilla Lame, MD;  Location[facility] ENDOSCOPY;  Service: Endoscopy;  Laterality: N/A;  . HERNIA MESH REMOVAL Right   . INGUINAL HERNIA REPAIR Left 12/04/2014   Procedure: HERNIA REPAIR INGUINAL ADULT;  Surgeon: Rochel Brome, MD;  Location: [facility] ORS;  Service: General;  Laterality: Left;  . PORTA CATH INSERTION N/A 05/16/2018   Procedure: PORTA CATH INSERTION;  Surgeon: Algernon Huxley, MD;  Location: [facility] LAB;  Service: Cardiovascular;  Laterality: N/A;    Prior to Admission medications   Medication Sig Start Date End Date Taking? Authorizing Provider  Fluticasone-Salmeterol (ADVAIR) 250-50 MCG/DOSE AEPB Inhale 1 puff into the lungs 2 (two) times daily.    Yes [provider]  insulin lispro (HUMALOG KWIKPEN) 100 UNIT/ML KwikPen Use 5 units at mealtime if blood glucose is > 150 05/31/18  Yes Borders, Kirt Boys, NP  LANTUS SOLOSTAR 100 UNIT/ML Solostar Pen 6 units suncutanous injection daily 05/05/18  Yes Wieting, Richard, MD  LORazepam (ATIVAN) 1 MG tablet Take 1 tablet (1 mg total) by mouth every 8 (eight) hours as needed for anxiety or sleep. 05/31/18  Yes Borders, Kirt Boys, NP  oxyCODONE (OXY IR/ROXICODONE) 5 MG immediate release tablet Take 1 tablet (5 mg total) by mouth every 8 (eight) hours as needed for severe pain. 05/20/18 06/19/18 Yes Sindy Guadeloupe, MD  vitamin B-12 (CYANOCOBALAMIN) 1000 MCG tablet Take 1,000 mcg by mouth  daily.   Yes [provider]  docusate sodium (COLACE) 100 MG capsule Take 1 capsule (100 mg total) by mouth daily as needed for mild constipation. Patient not taking: Reported on 05/09/2018 05/05/18   Loletha Grayer, MD  insulin aspart (NOVOLOG) 100 UNIT/ML injection 5 units subcutaneous prior to meals if sugars greater than 150 Patient  not taking: Reported on 06/07/2018 05/05/18   Loletha Grayer, MD  levofloxacin (LEVAQUIN) 500 MG tablet Take 1 tablet (500 mg total) by mouth daily. Patient not taking: Reported on 05/17/2018 05/05/18   Loletha Grayer, MD  lidocaine-prilocaine (EMLA) cream Apply to affected area once Patient not taking: Reported on 05/17/2018 05/13/18   Sindy Guadeloupe, MD  Melatonin 5 MG TABS Take 1 tablet (5 mg total) by mouth at bedtime. Patient not taking: Reported on 05/09/2018 05/05/18   Loletha Grayer, MD  ondansetron (ZOFRAN) 8 MG tablet Take 1 tablet (8 mg total) by mouth 2 (two) times daily as needed (Nausea or vomiting). Patient not taking: Reported on 05/17/2018 05/13/18   Sindy Guadeloupe, MD  potassium chloride 20 MEQ/15ML (10%) SOLN TAKE 15 MLS BY MOUTH EVERY DAY FOR 4 DAYS Patient not taking: No sig reported 06/03/18   Sindy Guadeloupe, MD  potassium chloride SA (K-DUR,KLOR-CON) 20 MEQ tablet Take 1 tablet (20 mEq total) by mouth daily. 05/05/18   Loletha Grayer, MD  prochlorperazine (COMPAZINE) 10 MG tablet Take 1 tablet (10 mg total) by mouth every 6 (six) hours as needed (Nausea or vomiting). Patient not taking: Reported on 05/17/2018 05/13/18   Sindy Guadeloupe, MD  traZODone (DESYREL) 100 MG tablet Take 1 tablet (100 mg total) by mouth at bedtime. Patient not taking: Reported on 06/07/2018 05/05/18   Loletha Grayer, MD    Allergies Patient has no known allergies.  Family History  Problem Relation Age of Onset  . Colon cancer Mother   . Melanoma Father     Social History Social History   Tobacco Use  . Smoking status: Current Every Day Smoker    Packs/day: 1.00    Years: 30.00    Pack years: 30.00  . Smokeless tobacco: Former Network engineer Use Topics  . Alcohol use: No  . Drug use: No    Review of Systems  Constitutional: Negative for fever. Eyes: Negative for visual changes. ENT: Negative for sore throat. Neck: No neck pain  Cardiovascular: Negative for chest  pain. Respiratory: Negative for shortness of breath. Gastrointestinal: Negative for abdominal pain, vomiting or diarrhea. Genitourinary: Negative for dysuria. Musculoskeletal: Negative for back pain. Skin: Negative for rash. Neurological: Negative for headaches, weakness or numbness. Psych: No SI or HI  ____________________________________________   PHYSICAL EXAM:  VITAL SIGNS: ED Triage Vitals  Enc Vitals Group     BP 06/07/18 1141 127/72     Pulse Rate 06/07/18 1141 99     Resp 06/07/18 1141 18     Temp 06/07/18 1141 98 F (36.7 C)     Temp Source 06/07/18 1141 Oral     SpO2 06/07/18 1141 97 %     Weight 06/07/18 1145 124 lb 5.4 oz (56.4 kg)     Height --      Head Circumference --      Peak Flow --      Pain Score 06/07/18 1145 0     Pain Loc --      Pain Edu? --      Excl. in [place]? --     Constitutional: Alert  and oriented. Well appearing and in no apparent distress. HEENT:      Head: Normocephalic and atraumatic.         Eyes: Conjunctivae are normal. Sclera is non-icteric.       Mouth/Throat: Mucous membranes are moist.       Neck: Supple with no signs of meningismus. Cardiovascular: Regular rate and rhythm. No murmurs, gallops, or rubs. 2+ symmetrical distal pulses are present in all extremities. No JVD. Respiratory: Normal respiratory effort. Lungs are clear to auscultation bilaterally. No wheezes, crackles, or rhonchi.  Gastrointestinal: Soft, non tender, and non distended with positive bowel sounds. No rebound or guarding. Genitourinary: No CVA tenderness. Musculoskeletal: Nontender with normal range of motion in all extremities. No edema, cyanosis, or erythema of extremities. Neurologic: Normal speech and language. Face is symmetric. Moving all extremities. No gross focal neurologic deficits are appreciated. Skin: Skin is warm, dry and intact. No rash noted. Psychiatric: Mood and affect are normal. Speech and behavior are  normal.  ____________________________________________   LABS (all labs ordered are listed, but only abnormal results are displayed)  Labs Reviewed  GLUCOSE, CAPILLARY - Abnormal; Notable for the following components:      Result Value   Glucose-Capillary 509 (*)    All other components within normal limits  URINALYSIS, COMPLETE (UACMP) WITH MICROSCOPIC - Abnormal; Notable for the following components:   Color, Urine YELLOW (*)    APPearance CLEAR (*)    Glucose, UA >=500 (*)    All other components within normal limits  BLOOD GAS, VENOUS - Abnormal; Notable for the following components:   Bicarbonate 32.3 (*)    Acid-Base Excess 6.6 (*)    All other components within normal limits  GLUCOSE, CAPILLARY - Abnormal; Notable for the following components:   Glucose-Capillary 321 (*)    All other components within normal limits  CBG MONITORING, ED   ____________________________________________  EKG  none  ____________________________________________  RADIOLOGY  none  ____________________________________________   PROCEDURES  Procedure(s) performed: None Procedures Critical Care performed:  None ____________________________________________   INITIAL IMPRESSION / ASSESSMENT AND PLAN / ED COURSE  77 y.o. male with a history of pancreatic cancer currently undergoing chemotherapy, diabetes on insulin, COPD and hypertension who presents from oncology clinic for elevated blood glucose.  Patient is extremely well-appearing and in no distress, has normal vital signs, labs done at oncology clinic at 10 AM this morning showed a sugar of 510 with normal bicarb, no anion gap.  There are no signs of DKA.  Will give fluids and IV insulin.  Will start patient on a sliding scale for his insulin and refer to Endocrinology as an outpatient.  VBG was done here which shows a normal pH of 7.41.  Anticipate discharge home once sugars are well controlled.    _________________________ 1:57 PM on  06/07/2018 -----------------------------------------  Repeat fingerstick showing blood glucose of 321.  Patient is requesting to be discharged.  Patient was provided with a sliding scale for his short acting insulin.  Will not make any changes to the long-acting.  Discussed close follow-up with his primary care doctor for further management of his insulin regimen.   As part of my medical decision making, I reviewed the following data within the [place] notes reviewed and incorporated, Labs reviewed , Old chart reviewed, Notes from prior ED visits and [place] Controlled Substance Database    Pertinent labs & imaging results that were available during my care of the patient were reviewed  by me and considered in my medical decision making (see chart for details).    ____________________________________________   FINAL CLINICAL IMPRESSION(S) / ED DIAGNOSES  Final diagnoses:  Hyperglycemia      NEW MEDICATIONS STARTED DURING THIS VISIT:  ED Discharge Orders    None       Note:  This document was prepared using Dragon voice recognition software and may include unintentional dictation errors.    Rudene Re, MD 06/07/18 1357

## 2018-06-07 NOTE — ED Triage Notes (Signed)
Pt to ED from home c/o hyperglycemia and was supposed to have chemo treatment today but unable to d/t blood sugar of 510.  Pt denies pain, SOB, or other symptoms.  A&Ox4, speaking in complete and coherent sentences, chest rise even and unlabored.  CBG in triage 509.

## 2018-06-07 NOTE — Discharge Instructions (Signed)
Continue to take your long acting insulin (Lantus) as prescribed (6 units in the morning). For the short acting insulin (novolog) please follow the below sliding scale.  Blood glucose (BG) < 70: no insulin BG 70 - 120: 0 units  BG 121 - 150: 2 units  BG 151 - 200: 3 units  BG 201 - 250: 5 units  BG 251 - 300: 8 units  BG 301 - 350: 11 units  BG 351 - 400: 15 units   BG > 400 call your doctor or come to the emergency room

## 2018-06-08 LAB — BLOOD GAS, VENOUS
Acid-Base Excess: 6.6 mmol/L — ABNORMAL HIGH (ref 0.0–2.0)
Bicarbonate: 32.3 mmol/L — ABNORMAL HIGH (ref 20.0–28.0)
PATIENT TEMPERATURE: 37
PH VEN: 7.41 (ref 7.250–7.430)
pCO2, Ven: 51 mmHg (ref 44.0–60.0)

## 2018-06-08 LAB — CANCER ANTIGEN 19-9: CAN 19-9: 185 U/mL — AB (ref 0–35)

## 2018-06-10 NOTE — Progress Notes (Signed)
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 During his recent appointment with primary care doctor he was found to have jaundice. On admission patient was noted to have a total bilirubin of 16 along with alkaline phosphatase of 1507 and elevated AST and ALT respectively. CBC showed white count of 12.4 and H&H of 9.1/25 with an MCV of 103 and a platelet count of 362. CT abdomen showed 2.2 x 2.9 cm mass in the pancreatic head concerning for pancreatic neoplasm. Double duct sign with intra-and extrahepatic ductal dilatation and dilatation of the pancreatic duct. Peripancreatic fluid collection versus necrotic lesions including a dominant 4.1 x 3.8 cm due to the pancreatic head with mass-effect in the distal bile duct. Moderate ascites. CT chest showed solitary 1.2 cm right upper lobe nodule-solitary metastases versus primary lung cancer.Gastrohepatic ligament adenopathyCA-19-9was elevated at 1988  Dr. Bunnie Philips ERCP with stent placement and biopieswhich showed adenocarcinoma which appears to be invading the duodenum and clinically from the pancreas.  Cycle 1 of palliative single agent gemcitabine started on 05/17/2018  Interval history- he is requiring assistance with his adls. Denies any pain. Leg swelling is much improved.   ECOG PS- 2 Pain scale-  0 Opioid associated constipation- no  Review of systems- Review of Systems  Constitutional: Positive for malaise/fatigue and weight loss. Negative for chills and fever.  HENT: Negative for congestion, ear discharge and nosebleeds.   Eyes: Negative for blurred vision.  Respiratory: Negative for cough, hemoptysis, sputum production, shortness of breath and wheezing.   Cardiovascular: Negative for chest pain, palpitations, orthopnea and claudication.  Gastrointestinal: Negative for abdominal pain, blood in stool, constipation, diarrhea, heartburn, melena, nausea and vomiting.  Genitourinary: Negative for dysuria, flank pain, frequency, hematuria and urgency.  Musculoskeletal: Negative for back pain, joint pain and myalgias.  Skin: Negative for rash.  Neurological: Negative for dizziness, tingling, focal weakness, seizures, weakness and headaches.  Endo/Heme/Allergies: Does not bruise/bleed easily.  Psychiatric/Behavioral: Negative for depression and suicidal ideas. The patient does not have insomnia.       No Known Allergies   Past Medical History:  Diagnosis Date  . COPD (chronic obstructive pulmonary disease) ([place])   . Diabetes mellitus without complication ([place])   . Hypertension   . Shortness of breath dyspnea      Past Surgical History:  Procedure Laterality Date  . COLON SURGERY     "twisted intestine"  . ERCP N/A 04/26/2018   Procedure: ENDOSCOPIC RETROGRADE CHOLANGIOPANCREATOGRAPHY (ERCP);  Surgeon: Lucilla Lame, MD;  Location: [facility]COPY;  Service: Endoscopy;  Laterality: N/A;  . HERNIA MESH REMOVAL Right   . INGUINAL HERNIA REPAIR Left 12/04/2014   Procedure: HERNIA REPAIR INGUINAL ADULT;  Surgeon: Rochel Brome, MD;  Location: [facility] ORS;  Service: General;  Laterality: Left;  . PORTA CATH INSERTION N/A 05/16/2018   Procedure: PORTA CATH INSERTION;  Surgeon: Algernon Huxley, MD;  Location: [place] [facility];  Service: Cardiovascular;  Laterality: N/A;  Social  History   Socioeconomic History  . Marital status: Married    Spouse name: Not on file  . Number of children: Not on file  . Years of education: Not on file  . Highest education level: Not on file  Occupational History  . Not on file  Social Needs  . Financial resource strain: Not on file  . Food insecurity:    Worry: Patient refused    Inability: Patient refused  . Transportation needs:    Medical: Patient refused    Non-medical: Patient refused  Tobacco Use  . Smoking status: Current Every Day Smoker    Packs/day: 1.00    Years: 30.00    Pack years: 30.00  . Smokeless tobacco: Former Network engineer and Sexual Activity  . Alcohol use: No  . Drug use: No  . Sexual activity: Never  Lifestyle  . Physical activity:    Days per week: Patient refused    Minutes per session: Patient refused  . Stress: Not on file  Relationships  . Social connections:    Talks on phone: Patient refused    Gets together: Patient refused    Attends religious service: Patient refused    Active member of club or organization: Patient refused    Attends meetings of clubs or organizations: Patient refused    Relationship status: Patient refused  . Intimate partner violence:    Fear of current or ex partner: Patient refused    Emotionally abused: Patient refused    Physically abused: Patient refused    Forced sexual activity: Patient refused  Other Topics Concern  . Not on file  Social History Narrative  . Not on file    Family History  Problem Relation Age of Onset  . Colon cancer Mother   . Melanoma Father      Current Outpatient Medications:  .  Fluticasone-Salmeterol (ADVAIR) 250-50 MCG/DOSE AEPB, Inhale 1 puff into the lungs 2 (two) times daily. , Disp: , Rfl:  .  insulin aspart (NOVOLOG) 100 UNIT/ML injection, 5 units subcutaneous prior to meals if sugars greater than 150 (Patient not taking: Reported on 06/07/2018), Disp: 10 mL, Rfl: 11 .  insulin lispro (HUMALOG KWIKPEN) 100  UNIT/ML KwikPen, Use 5 units at mealtime if blood glucose is > 150, Disp: 15 mL, Rfl: 11 .  LANTUS SOLOSTAR 100 UNIT/ML Solostar Pen, 6 units suncutanous injection daily, Disp: 15 mL, Rfl: 12 .  LORazepam (ATIVAN) 1 MG tablet, Take 1 tablet (1 mg total) by mouth every 8 (eight) hours as needed for anxiety or sleep., Disp: 30 tablet, Rfl: 0 .  oxyCODONE (OXY IR/ROXICODONE) 5 MG immediate release tablet, Take 1 tablet (5 mg total) by mouth every 8 (eight) hours as needed for severe pain., Disp: 90 tablet, Rfl: 0 .  potassium chloride SA (K-DUR,KLOR-CON) 20 MEQ tablet, Take 1 tablet (20 mEq total) by mouth daily., Disp: 30 tablet, Rfl: 0 .  traZODone (DESYREL) 100 MG tablet, Take 1 tablet (100 mg total) by mouth at bedtime. (Patient not taking: Reported on 06/07/2018), Disp: 30 tablet, Rfl: 0 .  vitamin B-12 (CYANOCOBALAMIN) 1000 MCG tablet, Take 1,000 mcg by mouth daily., Disp: , Rfl:  .  docusate sodium (COLACE) 100 MG capsule, Take 1 capsule (100 mg total) by mouth daily as needed for mild constipation. (Patient not taking: Reported on 05/09/2018), Disp: 60 capsule, Rfl: 0 .  levofloxacin (LEVAQUIN) 500 MG tablet, Take 1 tablet (500 mg total) by mouth daily. (Patient not taking: Reported  on 05/17/2018), Disp: 5 tablet, Rfl: 0 .  lidocaine-prilocaine (EMLA) cream, Apply to affected area once (Patient not taking: Reported on 05/17/2018), Disp: 30 g, Rfl: 3 .  Melatonin 5 MG TABS, Take 1 tablet (5 mg total) by mouth at bedtime. (Patient not taking: Reported on 05/09/2018), Disp: 30 tablet, Rfl: 0 .  ondansetron (ZOFRAN) 8 MG tablet, Take 1 tablet (8 mg total) by mouth 2 (two) times daily as needed (Nausea or vomiting). (Patient not taking: Reported on 05/17/2018), Disp: 30 tablet, Rfl: 1 .  potassium chloride 20 MEQ/15ML (10%) SOLN, TAKE 15 MLS BY MOUTH EVERY DAY FOR 4 DAYS (Patient not taking: No sig reported), Disp: 60 mL, Rfl: 0 .  prochlorperazine (COMPAZINE) 10 MG tablet, Take 1 tablet (10 mg total)  by mouth every 6 (six) hours as needed (Nausea or vomiting). (Patient not taking: Reported on 05/17/2018), Disp: 30 tablet, Rfl: 1 No current facility-administered medications for this visit.   Facility-Administered Medications Ordered in Other Visits:  .  sodium chloride flush (NS) 0.9 % injection 10 mL, 10 mL, Intracatheter, PRN, Sindy Guadeloupe, MD  Physical exam:  Vitals:   06/07/18 1015  BP: 131/80  Pulse: (!) 104  Resp: 18  Temp: (!) 97.5 F (36.4 C)  SpO2: 97%  Weight: 124 lb 6.4 oz (56.4 kg)  Height: 6' (1.829 m)   Physical Exam  Constitutional: He is oriented to person, place, and time.  Thin frail man sitting in a wheelchair. Appears in no acute distress  HENT:  Head: Normocephalic and atraumatic.  Eyes: Pupils are equal, round, and reactive to light. EOM are normal.  Neck: Normal range of motion.  Cardiovascular: Normal rate, regular rhythm and normal heart sounds.  Pulmonary/Chest: Effort normal and breath sounds normal.  Abdominal: Soft. Bowel sounds are normal.  Musculoskeletal: He exhibits edema (trace but significantly improved).  Neurological: He is alert and oriented to person, place, and time.  Skin: Skin is warm and dry.     CMP Latest Ref Rng & Units 06/07/2018  Glucose 70 - 99 mg/dL 510(HH)  BUN 8 - 23 mg/dL 18  Creatinine 0.61 - 1.24 mg/dL 0.76  Sodium 135 - 145 mmol/L 124(L)  Potassium 3.5 - 5.1 mmol/L 4.3  Chloride 98 - 111 mmol/L 86(L)  CO2 22 - 32 mmol/L 28  Calcium 8.9 - 10.3 mg/dL 8.5(L)  Total Protein 6.5 - 8.1 g/dL 6.8  Total Bilirubin 0.3 - 1.2 mg/dL 1.1  Alkaline Phos 38 - 126 U/L 178(H)  AST 15 - 41 U/L 25  ALT 0 - 44 U/L 18   CBC Latest Ref Rng & Units 06/07/2018  WBC 4.0 - 10.5 K/uL 9.4  Hemoglobin 13.0 - 17.0 g/dL 10.1(L)  Hematocrit 39.0 - 52.0 % 30.7(L)  Platelets 150 - 400 K/uL 845(H)    No images are attached to the encounter.  Ct Head Wo Contrast  Result Date: 06/06/2018 CLINICAL DATA:  Hyperglycemia. Pancreatic  cancer. Chemotherapy 2 weeks ago. Confusion. EXAM: CT HEAD WITHOUT CONTRAST TECHNIQUE: Contiguous axial images were obtained from the base of the skull through the vertex without intravenous contrast. COMPARISON:  None. FINDINGS: Brain: The brainstem, cerebellum, cerebral peduncles, thalami, basal ganglia, basilar cisterns, and ventricular system appear within normal limits. No intracranial hemorrhage, mass lesion, or acute CVA. Periventricular white matter and corona radiata hypodensities favor chronic ischemic microvascular white matter disease. Vascular: Unremarkable Skull: Unremarkable Sinuses/Orbits: Unremarkable Other: No supplemental non-categorized findings. IMPRESSION: 1. No acute intracranial findings. 2. Periventricular white matter and  corona radiata hypodensities favor chronic ischemic microvascular white matter disease. Electronically Signed   By: Van Clines M.D.   On: 06/06/2018 17:07   Mr Brain W And Wo Contrast  Result Date: 06/06/2018 CLINICAL DATA:  Altered level of consciousness. History of pancreatic cancer. EXAM: MRI HEAD WITHOUT AND WITH CONTRAST TECHNIQUE: Multiplanar, multiecho pulse sequences of the brain and surrounding structures were obtained without and with intravenous contrast. CONTRAST:  5.5 mL Gadovist IV COMPARISON:  CT head 06/06/2018 FINDINGS: Brain: Moderate atrophy.  Moderate chronic white matter changes. Negative for acute infarct.  Negative for hemorrhage or mass. Normal enhancement.  Negative for metastatic disease. Vascular: Normal arterial flow voids Skull and upper cervical spine: Negative Sinuses/Orbits: Mild mucosal edema paranasal sinuses.  Normal orbit Other: None IMPRESSION: No acute intracranial abnormality. Atrophy and chronic microvascular ischemic changes in the white matter. Electronically Signed   By: Franchot Gallo M.D.   On: 06/06/2018 20:18   Dg Chest Portable 1 View  Result Date: 06/06/2018 CLINICAL DATA:  Altered mental status. History of  pancreatic cancer. EXAM: PORTABLE CHEST 1 VIEW COMPARISON:  05/03/2018 FINDINGS: Right jugular Port-A-Cath is present. Catheter tip in the SVC region. Negative for pneumothorax. Coarse lung markings with increased lucency. Findings are compatible with emphysema. Again noted is a pulmonary nodule in the lateral right chest. Heart and mediastinum are within normal limits. Atherosclerotic calcifications at the aortic arch. IMPRESSION: No acute cardiopulmonary disease. Emphysematous changes and re-demonstration of a right pulmonary nodule. Port-A-Cath position as described. Electronically Signed   By: Markus Daft M.D.   On: 06/06/2018 16:06     Assessment and plan- Patient is a 77 y.o. 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  he is here for on treatment assessment prior to cycle 2 day 1 of gemcitabine  Counts are ok to proceed with cycle 2 day 1 of gemcitabine today. His bilirubin has also normalized and CA 19-9 has reduced significantly.   However his blood sugar today is 510. He was just in the ER for the same reason yesterday. Unfortunately he cannot receive chemotheraopy due to his high blood sugars. I have again expalined to him that it would be his PCP managing his blood sugars and not me. He needs to follow up with hm closely and see if endocrinology referral would be required. He will be going to ER for hishigh blood sugars  He will go for cycle 2 day 8 of chemotherapy next week. I will see him back in 3 weeks for cycle 3 day 1 chemo in 2 weeks. Cbc/cmp each week   Visit Diagnosis 1. Malignant neoplasm of pancreas, unspecified location of malignancy [facility])      Dr. Randa Evens, MD, MPH [facility] [phone] 06/10/2018 8:04 AM

## 2018-06-14 ENCOUNTER — Inpatient Hospital Stay: Payer: Medicare Other

## 2018-06-14 VITALS — BP 125/81 | HR 89 | Temp 95.8°F | Resp 20

## 2018-06-14 DIAGNOSIS — Z5111 Encounter for antineoplastic chemotherapy: Secondary | ICD-10-CM | POA: Diagnosis not present

## 2018-06-14 DIAGNOSIS — C259 Malignant neoplasm of pancreas, unspecified: Secondary | ICD-10-CM

## 2018-06-14 LAB — CBC WITH DIFFERENTIAL/PLATELET
ABS IMMATURE GRANULOCYTES: 0.14 10*3/uL — AB (ref 0.00–0.07)
Basophils Absolute: 0.1 10*3/uL (ref 0.0–0.1)
Basophils Relative: 1 %
Eosinophils Absolute: 0 10*3/uL (ref 0.0–0.5)
Eosinophils Relative: 0 %
HCT: 33.8 % — ABNORMAL LOW (ref 39.0–52.0)
HEMOGLOBIN: 11.2 g/dL — AB (ref 13.0–17.0)
Immature Granulocytes: 1 %
LYMPHS PCT: 23 %
Lymphs Abs: 2.6 10*3/uL (ref 0.7–4.0)
MCH: 31.9 pg (ref 26.0–34.0)
MCHC: 33.1 g/dL (ref 30.0–36.0)
MCV: 96.3 fL (ref 80.0–100.0)
MONO ABS: 0.9 10*3/uL (ref 0.1–1.0)
MONOS PCT: 8 %
Neutro Abs: 7.7 10*3/uL (ref 1.7–7.7)
Neutrophils Relative %: 67 %
Platelets: 667 10*3/uL — ABNORMAL HIGH (ref 150–400)
RBC: 3.51 MIL/uL — AB (ref 4.22–5.81)
RDW: 15.4 % (ref 11.5–15.5)
WBC: 11.5 10*3/uL — ABNORMAL HIGH (ref 4.0–10.5)
nRBC: 0 % (ref 0.0–0.2)

## 2018-06-14 LAB — COMPREHENSIVE METABOLIC PANEL
ALBUMIN: 3.1 g/dL — AB (ref 3.5–5.0)
ALT: 18 U/L (ref 0–44)
AST: 30 U/L (ref 15–41)
Alkaline Phosphatase: 120 U/L (ref 38–126)
Anion gap: 7 (ref 5–15)
BUN: 11 mg/dL (ref 8–23)
CHLORIDE: 90 mmol/L — AB (ref 98–111)
CO2: 29 mmol/L (ref 22–32)
Calcium: 8.8 mg/dL — ABNORMAL LOW (ref 8.9–10.3)
Creatinine, Ser: 0.58 mg/dL — ABNORMAL LOW (ref 0.61–1.24)
GFR calc Af Amer: >60 mL/min (ref >60)
GFR calc non Af Amer: >60 mL/min (ref >60)
GLUCOSE: 205 mg/dL — AB (ref 70–99)
POTASSIUM: 3.3 mmol/L — AB (ref 3.5–5.1)
Sodium: 126 mmol/L — ABNORMAL LOW (ref 135–145)
Total Bilirubin: 1.2 mg/dL (ref 0.3–1.2)
Total Protein: 6.9 g/dL (ref 6.5–8.1)

## 2018-06-14 MED ORDER — SODIUM CHLORIDE 0.9 % IV SOLN
1400.0000 mg | Freq: Once | INTRAVENOUS | Status: AC
  Administered 2018-06-14: 1400 mg via INTRAVENOUS
  Filled 2018-06-14: qty 26.3

## 2018-06-14 MED ORDER — HEPARIN SOD (PORK) LOCK FLUSH 100 UNIT/ML IV SOLN
500.0000 [IU] | Freq: Once | INTRAVENOUS | Status: AC
  Administered 2018-06-14: 500 [IU] via INTRAVENOUS
  Filled 2018-06-14: qty 5

## 2018-06-14 MED ORDER — HEPARIN SOD (PORK) LOCK FLUSH 100 UNIT/ML IV SOLN: 500.0000 [IU] | Freq: Once | INTRAVENOUS | Status: DC | PRN

## 2018-06-14 MED ORDER — SODIUM CHLORIDE 0.9% FLUSH
10.0000 mL | INTRAVENOUS | Status: DC | PRN
  Administered 2018-06-14: 10 mL via INTRAVENOUS
  Filled 2018-06-14: qty 10

## 2018-06-14 MED ORDER — SODIUM CHLORIDE 0.9% FLUSH
10.0000 mL | INTRAVENOUS | Status: DC | PRN
  Filled 2018-06-14: qty 10

## 2018-06-14 MED ORDER — PROCHLORPERAZINE MALEATE 10 MG PO TABS
10.0000 mg | ORAL_TABLET | Freq: Once | ORAL | Status: AC
  Administered 2018-06-14: 10 mg via ORAL
  Filled 2018-06-14: qty 1

## 2018-06-14 MED ORDER — SODIUM CHLORIDE 0.9 % IV SOLN
Freq: Once | INTRAVENOUS | Status: AC
  Administered 2018-06-14: 14:00:00 via INTRAVENOUS
  Filled 2018-06-14: qty 250

## 2018-06-28 ENCOUNTER — Inpatient Hospital Stay: Payer: Medicare Other

## 2018-06-28 ENCOUNTER — Inpatient Hospital Stay: Payer: Medicare Other | Attending: Oncology | Admitting: Oncology

## 2018-06-28 ENCOUNTER — Other Ambulatory Visit: Payer: Self-pay

## 2018-06-28 VITALS — BP 108/66 | HR 100 | Temp 97.4°F | Resp 18 | Wt 123.7 lb

## 2018-06-28 DIAGNOSIS — C259 Malignant neoplasm of pancreas, unspecified: Secondary | ICD-10-CM

## 2018-06-28 DIAGNOSIS — C779 Secondary and unspecified malignant neoplasm of lymph node, unspecified: Secondary | ICD-10-CM | POA: Insufficient documentation

## 2018-06-28 DIAGNOSIS — F17210 Nicotine dependence, cigarettes, uncomplicated: Secondary | ICD-10-CM | POA: Diagnosis not present

## 2018-06-28 DIAGNOSIS — E871 Hypo-osmolality and hyponatremia: Secondary | ICD-10-CM | POA: Insufficient documentation

## 2018-06-28 DIAGNOSIS — E876 Hypokalemia: Secondary | ICD-10-CM | POA: Insufficient documentation

## 2018-06-28 DIAGNOSIS — Z79899 Other long term (current) drug therapy: Secondary | ICD-10-CM | POA: Insufficient documentation

## 2018-06-28 DIAGNOSIS — Z5111 Encounter for antineoplastic chemotherapy: Secondary | ICD-10-CM | POA: Diagnosis present

## 2018-06-28 DIAGNOSIS — C250 Malignant neoplasm of head of pancreas: Secondary | ICD-10-CM | POA: Diagnosis not present

## 2018-06-28 LAB — CBC WITH DIFFERENTIAL/PLATELET
Abs Immature Granulocytes: 0.04 10*3/uL (ref 0.00–0.07)
Basophils Absolute: 0.1 10*3/uL (ref 0.0–0.1)
Basophils Relative: 0 %
Eosinophils Absolute: 0 10*3/uL (ref 0.0–0.5)
Eosinophils Relative: 0 %
HCT: 33.2 % — ABNORMAL LOW (ref 39.0–52.0)
Hemoglobin: 11 g/dL — ABNORMAL LOW (ref 13.0–17.0)
Immature Granulocytes: 0 %
Lymphocytes Relative: 19 %
Lymphs Abs: 2.1 10*3/uL (ref 0.7–4.0)
MCH: 31.8 pg (ref 26.0–34.0)
MCHC: 33.1 g/dL (ref 30.0–36.0)
MCV: 96 fL (ref 80.0–100.0)
Monocytes Absolute: 1.4 10*3/uL — ABNORMAL HIGH (ref 0.1–1.0)
Monocytes Relative: 12 %
Neutro Abs: 7.7 10*3/uL (ref 1.7–7.7)
Neutrophils Relative %: 69 %
Platelets: 204 10*3/uL (ref 150–400)
RBC: 3.46 MIL/uL — ABNORMAL LOW (ref 4.22–5.81)
RDW: 15.3 % (ref 11.5–15.5)
WBC: 11.3 10*3/uL — ABNORMAL HIGH (ref 4.0–10.5)
nRBC: 0 % (ref 0.0–0.2)

## 2018-06-28 LAB — COMPREHENSIVE METABOLIC PANEL WITH GFR
ALT: 18 U/L (ref 0–44)
AST: 21 U/L (ref 15–41)
Albumin: 3.4 g/dL — ABNORMAL LOW (ref 3.5–5.0)
Alkaline Phosphatase: 95 U/L (ref 38–126)
Anion gap: 9 (ref 5–15)
BUN: 12 mg/dL (ref 8–23)
CO2: 27 mmol/L (ref 22–32)
Calcium: 8.8 mg/dL — ABNORMAL LOW (ref 8.9–10.3)
Chloride: 92 mmol/L — ABNORMAL LOW (ref 98–111)
Creatinine, Ser: 0.58 mg/dL — ABNORMAL LOW (ref 0.61–1.24)
GFR calc Af Amer: >60 mL/min
GFR calc non Af Amer: >60 mL/min
Glucose, Bld: 105 mg/dL — ABNORMAL HIGH (ref 70–99)
Potassium: 3.9 mmol/L (ref 3.5–5.1)
Sodium: 128 mmol/L — ABNORMAL LOW (ref 135–145)
Total Bilirubin: 0.9 mg/dL (ref 0.3–1.2)
Total Protein: 6.9 g/dL (ref 6.5–8.1)

## 2018-06-28 MED ORDER — HEPARIN SOD (PORK) LOCK FLUSH 100 UNIT/ML IV SOLN
500.0000 [IU] | Freq: Once | INTRAVENOUS | Status: AC
  Administered 2018-06-28: 500 [IU] via INTRAVENOUS
  Filled 2018-06-28: qty 5

## 2018-06-28 MED ORDER — PROCHLORPERAZINE MALEATE 10 MG PO TABS
10.0000 mg | ORAL_TABLET | Freq: Once | ORAL | Status: AC
  Administered 2018-06-28: 10 mg via ORAL
  Filled 2018-06-28: qty 1

## 2018-06-28 MED ORDER — LORAZEPAM 1 MG PO TABS: 1.0000 mg | ORAL_TABLET | Freq: Three times a day (TID) | ORAL | 0 refills | Status: DC | PRN

## 2018-06-28 MED ORDER — SODIUM CHLORIDE 0.9% FLUSH
10.0000 mL | INTRAVENOUS | Status: AC | PRN
  Administered 2018-06-28: 10 mL via INTRAVENOUS
  Filled 2018-06-28: qty 10

## 2018-06-28 MED ORDER — SODIUM CHLORIDE 0.9 % IV SOLN
Freq: Once | INTRAVENOUS | Status: AC
  Administered 2018-06-28: 14:00:00 via INTRAVENOUS
  Filled 2018-06-28: qty 250

## 2018-06-28 MED ORDER — SODIUM CHLORIDE 0.9 % IV SOLN
1400.0000 mg | Freq: Once | INTRAVENOUS | Status: AC
  Administered 2018-06-28: 1400 mg via INTRAVENOUS
  Filled 2018-06-28: qty 10.52

## 2018-06-28 MED ORDER — PROCHLORPERAZINE MALEATE 10 MG PO TABS: 10.0000 mg | ORAL_TABLET | Freq: Four times a day (QID) | ORAL | 1 refills | Status: DC | PRN

## 2018-06-28 NOTE — Progress Notes (Signed)
Here for follow up. Per pt " I feel good " ( then talks of nausea -taking compazine for w effect ( needs renewals of Compazine and Ativan-pended )

## 2018-07-01 ENCOUNTER — Encounter: Payer: Self-pay | Admitting: Oncology

## 2018-07-01 NOTE — Progress Notes (Signed)
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 During his recent appointment with primary care doctor he was found to have jaundice. On admission patient was noted to have a total bilirubin of 16 along with alkaline phosphatase of 1507 and elevated AST and ALT respectively. CBC showed white count of 12.4 and H&H of 9.1/25 with an MCV of 103 and a platelet count of 362. CT abdomen showed 2.2 x 2.9 cm mass in the pancreatic head concerning for pancreatic neoplasm. Double duct sign with intra-and extrahepatic ductal dilatation and dilatation of the pancreatic duct. Peripancreatic fluid collection versus necrotic lesions including a dominant 4.1 x 3.8 cm due to the pancreatic head with mass-effect in the distal bile duct. Moderate ascites. CT chest showed solitary 1.2 cm right upper lobe nodule-solitary metastases versus primary lung cancer.Gastrohepatic ligament adenopathyCA-19-9was elevated at 1988  Dr. Bunnie Philips ERCP with stent placement and biopieswhich showed adenocarcinoma which appears to be invading the duodenum and clinically from the pancreas.  Cycle 1 of palliative single agent gemcitabine started on 05/17/2018   Interval history- he reports doing well. Denies any pain. Weight and appetite is stable. He is able to do his chores around the house with  frequent periods of rest.   ECOG PS- 2 Pain scale- 0 Opioid associated constipation- no  Review of systems- Review of Systems  Constitutional: Positive for malaise/fatigue. Negative for chills, fever and weight loss.  HENT: Negative for congestion, ear discharge and nosebleeds.   Eyes: Negative for blurred vision.  Respiratory: Negative for cough, hemoptysis, sputum production, shortness of breath and wheezing.   Cardiovascular: Negative for chest pain, palpitations, orthopnea and claudication.  Gastrointestinal: Negative for abdominal pain, blood in stool, constipation, diarrhea, heartburn, melena, nausea and vomiting.  Genitourinary: Negative for dysuria, flank pain, frequency, hematuria and urgency.  Musculoskeletal: Negative for back pain, joint pain and myalgias.  Skin: Negative for rash.  Neurological: Negative for dizziness, tingling, focal weakness, seizures, weakness and headaches.  Endo/Heme/Allergies: Does not bruise/bleed easily.  Psychiatric/Behavioral: Negative for depression and suicidal ideas. The patient does not have insomnia.       No Known Allergies   Past Medical History:  Diagnosis Date  . COPD (chronic obstructive pulmonary disease) ([place])   . Diabetes mellitus without complication ([place])   . Hypertension   . Shortness of breath dyspnea      Past Surgical History:  Procedure Laterality Date  . COLON SURGERY     "twisted intestine"  . ERCP N/A 04/26/2018   Procedure: ENDOSCOPIC RETROGRADE CHOLANGIOPANCREATOGRAPHY (ERCP);  Surgeon: Lucilla Lame, MD;  Location: [facility] ENDOSCOPY;  Service: Endoscopy;  Laterality: N/A;  . HERNIA MESH REMOVAL Right   . INGUINAL HERNIA REPAIR Left 12/04/2014   Procedure: HERNIA REPAIR INGUINAL ADULT;  Surgeon: Rochel Brome, MD;  Location: [facility] ORS;  Service: General;  Laterality: Left;  . PORTA CATH INSERTION N/A 05/16/2018   Procedure: PORTA CATH INSERTION;  Surgeon: Algernon Huxley, MD;  Location:  [place] [facility];  Service:  Cardiovascular;  Laterality: N/A;    Social History   Socioeconomic History  . Marital status: Married    Spouse name: Not on file  . Number of children: Not on file  . Years of education: Not on file  . Highest education level: Not on file  Occupational History  . Not on file  Social Needs  . Financial resource strain: Not on file  . Food insecurity:    Worry: Patient refused    Inability: Patient refused  . Transportation needs:    Medical: Patient refused    Non-medical: Patient refused  Tobacco Use  . Smoking status: Current Every Day Smoker    Packs/day: 1.00    Years: 30.00    Pack years: 30.00  . Smokeless tobacco: Former Network engineer and Sexual Activity  . Alcohol use: No  . Drug use: No  . Sexual activity: Never  Lifestyle  . Physical activity:    Days per week: Patient refused    Minutes per session: Patient refused  . Stress: Not on file  Relationships  . Social connections:    Talks on phone: Patient refused    Gets together: Patient refused    Attends religious service: Patient refused    Active member of club or organization: Patient refused    Attends meetings of clubs or organizations: Patient refused    Relationship status: Patient refused  . Intimate partner violence:    Fear of current or ex partner: Patient refused    Emotionally abused: Patient refused    Physically abused: Patient refused    Forced sexual activity: Patient refused  Other Topics Concern  . Not on file  Social History Narrative  . Not on file    Family History  Problem Relation Age of Onset  . Colon cancer Mother   . Melanoma Father      Current Outpatient Medications:  .  Fluticasone-Salmeterol (ADVAIR) 250-50 MCG/DOSE AEPB, Inhale 1 puff into the lungs 2 (two) times daily. , Disp: , Rfl:  .  insulin aspart (NOVOLOG) 100 UNIT/ML injection, 5 units subcutaneous prior to meals if sugars greater than 150, Disp: 10 mL, Rfl: 11 .  insulin lispro (HUMALOG KWIKPEN) 100  UNIT/ML KwikPen, Use 5 units at mealtime if blood glucose is > 150, Disp: 15 mL, Rfl: 11 .  LANTUS SOLOSTAR 100 UNIT/ML Solostar Pen, 6 units suncutanous injection daily, Disp: 15 mL, Rfl: 12 .  LORazepam (ATIVAN) 1 MG tablet, Take 1 tablet (1 mg total) by mouth every 8 (eight) hours as needed for anxiety or sleep., Disp: 30 tablet, Rfl: 0 .  potassium chloride 20 MEQ/15ML (10%) SOLN, TAKE 15 MLS BY MOUTH EVERY DAY FOR 4 DAYS, Disp: 60 mL, Rfl: 0 .  potassium chloride SA (K-DUR,KLOR-CON) 20 MEQ tablet, Take 1 tablet (20 mEq total) by mouth daily. (Patient taking differently: Take 20 mEq by mouth daily. ), Disp: 30 tablet, Rfl: 0 .  prochlorperazine (COMPAZINE) 10 MG tablet, Take 1 tablet (10 mg total) by mouth every 6 (six) hours as needed (Nausea or vomiting)., Disp: 30 tablet, Rfl: 1 .  traZODone (DESYREL) 100 MG tablet, Take 1 tablet (100 mg total) by mouth at bedtime., Disp: 30 tablet, Rfl: 0 .  vitamin B-12 (CYANOCOBALAMIN) 1000 MCG tablet, Take 1,000 mcg by mouth daily., Disp: , Rfl:  .  docusate sodium (COLACE) 100 MG capsule, Take 1 capsule (100 mg total) by mouth daily as needed for mild constipation. (  Patient not taking: Reported on 05/09/2018), Disp: 60 capsule, Rfl: 0 .  lidocaine-prilocaine (EMLA) cream, Apply to affected area once (Patient not taking: Reported on 06/28/2018), Disp: 30 g, Rfl: 3 .  Melatonin 5 MG TABS, Take 1 tablet (5 mg total) by mouth at bedtime. (Patient not taking: Reported on 05/09/2018), Disp: 30 tablet, Rfl: 0 .  ondansetron (ZOFRAN) 8 MG tablet, Take 1 tablet (8 mg total) by mouth 2 (two) times daily as needed (Nausea or vomiting). (Patient not taking: Reported on 05/17/2018), Disp: 30 tablet, Rfl: 1 No current facility-administered medications for this visit.   Facility-Administered Medications Ordered in Other Visits:  .  sodium chloride flush (NS) 0.9 % injection 10 mL, 10 mL, Intracatheter, PRN, Sindy Guadeloupe, MD .  sodium chloride flush (NS) 0.9 % injection  10 mL, 10 mL, Intravenous, PRN, Sindy Guadeloupe, MD, 10 mL at 06/28/18 1246  Physical exam:  Vitals:   06/28/18 1316  BP: 108/66  Pulse: 100  Resp: 18  Temp: (!) 97.4 F (36.3 C)  TempSrc: Tympanic  Weight: 123 lb 11.2 oz (56.1 kg)   Physical Exam  Constitutional: He is oriented to person, place, and time.  He is thin. Appears in no acute distress  HENT:  Head: Normocephalic and atraumatic.  Eyes: Pupils are equal, round, and reactive to light. EOM are normal.  Neck: Normal range of motion.  Cardiovascular: Normal rate, regular rhythm and normal heart sounds.  Pulmonary/Chest: Effort normal and breath sounds normal.  Abdominal: Soft. Bowel sounds are normal.  Musculoskeletal: He exhibits no edema.  Neurological: He is alert and oriented to person, place, and time.  Skin: Skin is warm and dry.     CMP Latest Ref Rng & Units 06/28/2018  Glucose 70 - 99 mg/dL 105(H)  BUN 8 - 23 mg/dL 12  Creatinine 0.61 - 1.24 mg/dL 0.58(L)  Sodium 135 - 145 mmol/L 128(L)  Potassium 3.5 - 5.1 mmol/L 3.9  Chloride 98 - 111 mmol/L 92(L)  CO2 22 - 32 mmol/L 27  Calcium 8.9 - 10.3 mg/dL 8.8(L)  Total Protein 6.5 - 8.1 g/dL 6.9  Total Bilirubin 0.3 - 1.2 mg/dL 0.9  Alkaline Phos 38 - 126 U/L 95  AST 15 - 41 U/L 21  ALT 0 - 44 U/L 18   CBC Latest Ref Rng & Units 06/28/2018  WBC 4.0 - 10.5 K/uL 11.3(H)  Hemoglobin 13.0 - 17.0 g/dL 11.0(L)  Hematocrit 39.0 - 52.0 % 33.2(L)  Platelets 150 - 400 K/uL 204    No images are attached to the encounter.  Ct Head Wo Contrast  Result Date: 06/06/2018 CLINICAL DATA:  Hyperglycemia. Pancreatic cancer. Chemotherapy 2 weeks ago. Confusion. EXAM: CT HEAD WITHOUT CONTRAST TECHNIQUE: Contiguous axial images were obtained from the base of the skull through the vertex without intravenous contrast. COMPARISON:  None. FINDINGS: Brain: The brainstem, cerebellum, cerebral peduncles, thalami, basal ganglia, basilar cisterns, and ventricular system appear within  normal limits. No intracranial hemorrhage, mass lesion, or acute CVA. Periventricular white matter and corona radiata hypodensities favor chronic ischemic microvascular white matter disease. Vascular: Unremarkable Skull: Unremarkable Sinuses/Orbits: Unremarkable Other: No supplemental non-categorized findings. IMPRESSION: 1. No acute intracranial findings. 2. Periventricular white matter and corona radiata hypodensities favor chronic ischemic microvascular white matter disease. Electronically Signed   By: Van Clines M.D.   On: 06/06/2018 17:07   Mr Brain W And Wo Contrast  Result Date: 06/06/2018 CLINICAL DATA:  Altered level of consciousness. History of pancreatic cancer. EXAM: MRI  HEAD WITHOUT AND WITH CONTRAST TECHNIQUE: Multiplanar, multiecho pulse sequences of the brain and surrounding structures were obtained without and with intravenous contrast. CONTRAST:  5.5 mL Gadovist IV COMPARISON:  CT head 06/06/2018 FINDINGS: Brain: Moderate atrophy.  Moderate chronic white matter changes. Negative for acute infarct.  Negative for hemorrhage or mass. Normal enhancement.  Negative for metastatic disease. Vascular: Normal arterial flow voids Skull and upper cervical spine: Negative Sinuses/Orbits: Mild mucosal edema paranasal sinuses.  Normal orbit Other: None IMPRESSION: No acute intracranial abnormality. Atrophy and chronic microvascular ischemic changes in the white matter. Electronically Signed   By: Franchot Gallo M.D.   On: 06/06/2018 20:18   Dg Chest Portable 1 View  Result Date: 06/06/2018 CLINICAL DATA:  Altered mental status. History of pancreatic cancer. EXAM: PORTABLE CHEST 1 VIEW COMPARISON:  05/03/2018 FINDINGS: Right jugular Port-A-Cath is present. Catheter tip in the SVC region. Negative for pneumothorax. Coarse lung markings with increased lucency. Findings are compatible with emphysema. Again noted is a pulmonary nodule in the lateral right chest. Heart and mediastinum are within  normal limits. Atherosclerotic calcifications at the aortic arch. IMPRESSION: No acute cardiopulmonary disease. Emphysematous changes and re-demonstration of a right pulmonary nodule. Port-A-Cath position as described. Electronically Signed   By: Markus Daft M.D.   On: 06/06/2018 16:06     Assessment and plan- Patient is a 77 y.o. 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 He is here for on treatment assessment prior to cycle 3 day 1 of gemcitabine  Patient did not get cycle 2-day 15 of gemcitabine 1 week ago as it was Thanksgiving.  I will therefore consider today's dose of cycle 3-day 1 of gemcitabine and counts are okay to proceed with treatment today.  His blood sugars are better controlled.  I will be adding Abraxane at 100 mg/m with cycle 3-day 15 next week as his bilirubin has normalized.  Discussed risks and benefits of Abraxane including all but not limited to nausea vomiting, fatigue, risk of peripheral neuropathy.  Patient understands and agrees to proceed.  If patient has problems tolerating the combination regimen I will consider dropping Abraxane at that time.  Patient has chronic hyponatremia which is essentially stable.  Continue to monitor  Overall he seems to be responding to his treatment and CA-19-9 is coming down from 1988 to 185 3 weeks ago.  I will be doing repeat scans after 4 cycles.   Visit Diagnosis 1. Encounter for antineoplastic chemotherapy   2. Malignant neoplasm of pancreas, unspecified location of malignancy ([place])   3. Chronic hyponatremia      Dr. Randa Evens, MD, MPH [facility] at [facility] [phone] 07/01/2018 12:16 PM

## 2018-07-03 ENCOUNTER — Telehealth: Payer: Self-pay | Admitting: *Deleted

## 2018-07-03 NOTE — Telephone Encounter (Deleted)
Daughter called and asks that you return her call. She is wanting someone to write for his insulin needles

## 2018-07-03 NOTE — Telephone Encounter (Signed)
Daughter is going to call [facility] and ask that they resend prescription for four times a day use not daily. I had looked up on there system that prescription was sent in

## 2018-07-04 ENCOUNTER — Telehealth: Payer: Self-pay

## 2018-07-04 NOTE — Telephone Encounter (Signed)
Nutrition Follow-up:  Patient with stage IV pancreatic cancer followed by Dr. Janese Banks.    Called patient for nutrition follow-up this am.  Patient reports appetite is good.  "I am getting ready to go to breakfast in a little bit."  Planning to eat eggs and sausage.  Continues to drink oral nutrition supplement shakes.  Denies other nutrition impact symptoms at this time.    Reports has talked to PCP and DM medication has been adjusted for better control.   Medications: reviewed  Labs: glucose 105, Na 128  Anthropometrics:   Weight decreased to 123 lb 11.2 oz noted on 12/6.  Weight had been 128 lb 12.8 oz on 11/8.     NUTRITION DIAGNOSIS: Unintentional weight loss continues   INTERVENTION:  Encouraged patient to reach out to PCP if blood glucose to remain high for medication adjustment vs restricting food intake at this time.   Discussed importance of combining carbohydrate food with protein rich food.  Patient reports, " I am doing the best I can." Encouraged high calorie shake (350 calorie) at this time vs glucerna (180 calories) to promote weight gain at least 3 times per day.    MONITORING, EVALUATION, GOAL: Patient will consume adequate calories to prevent further weight loss   NEXT VISIT: as needed, patient to contact RD with questions or concerns  Latica Hohmann B. Zenia Resides, [place], [place] Registered Dietitian [phone] (pager)

## 2018-07-05 ENCOUNTER — Encounter: Payer: Self-pay | Admitting: Oncology

## 2018-07-05 ENCOUNTER — Inpatient Hospital Stay: Payer: Medicare Other

## 2018-07-05 ENCOUNTER — Other Ambulatory Visit: Payer: Self-pay | Admitting: Oncology

## 2018-07-05 ENCOUNTER — Inpatient Hospital Stay (HOSPITAL_BASED_OUTPATIENT_CLINIC_OR_DEPARTMENT_OTHER): Payer: Medicare Other | Admitting: Oncology

## 2018-07-05 VITALS — BP 102/62 | HR 88 | Temp 97.9°F | Resp 18 | Ht 72.0 in | Wt 126.6 lb

## 2018-07-05 DIAGNOSIS — E876 Hypokalemia: Secondary | ICD-10-CM

## 2018-07-05 DIAGNOSIS — Z5111 Encounter for antineoplastic chemotherapy: Secondary | ICD-10-CM | POA: Diagnosis not present

## 2018-07-05 DIAGNOSIS — E871 Hypo-osmolality and hyponatremia: Secondary | ICD-10-CM

## 2018-07-05 DIAGNOSIS — C250 Malignant neoplasm of head of pancreas: Secondary | ICD-10-CM

## 2018-07-05 DIAGNOSIS — F17210 Nicotine dependence, cigarettes, uncomplicated: Secondary | ICD-10-CM

## 2018-07-05 DIAGNOSIS — Z79899 Other long term (current) drug therapy: Secondary | ICD-10-CM

## 2018-07-05 DIAGNOSIS — C779 Secondary and unspecified malignant neoplasm of lymph node, unspecified: Secondary | ICD-10-CM | POA: Diagnosis not present

## 2018-07-05 DIAGNOSIS — C259 Malignant neoplasm of pancreas, unspecified: Secondary | ICD-10-CM

## 2018-07-05 LAB — CBC WITH DIFFERENTIAL/PLATELET
Abs Immature Granulocytes: 0.01 10*3/uL (ref 0.00–0.07)
BASOS ABS: 0 10*3/uL (ref 0.0–0.1)
Basophils Relative: 1 %
EOS ABS: 0.1 10*3/uL (ref 0.0–0.5)
EOS PCT: 1 %
HCT: 34.6 % — ABNORMAL LOW (ref 39.0–52.0)
Hemoglobin: 11.4 g/dL — ABNORMAL LOW (ref 13.0–17.0)
Immature Granulocytes: 0 %
Lymphocytes Relative: 32 %
Lymphs Abs: 1.7 10*3/uL (ref 0.7–4.0)
MCH: 31.6 pg (ref 26.0–34.0)
MCHC: 32.9 g/dL (ref 30.0–36.0)
MCV: 95.8 fL (ref 80.0–100.0)
Monocytes Absolute: 0.5 10*3/uL (ref 0.1–1.0)
Monocytes Relative: 10 %
NRBC: 0 % (ref 0.0–0.2)
Neutro Abs: 2.9 10*3/uL (ref 1.7–7.7)
Neutrophils Relative %: 56 %
Platelets: 239 10*3/uL (ref 150–400)
RBC: 3.61 MIL/uL — AB (ref 4.22–5.81)
RDW: 14.7 % (ref 11.5–15.5)
WBC: 5.2 10*3/uL (ref 4.0–10.5)

## 2018-07-05 LAB — COMPREHENSIVE METABOLIC PANEL
ALK PHOS: 91 U/L (ref 38–126)
ALT: 18 U/L (ref 0–44)
AST: 23 U/L (ref 15–41)
Albumin: 3.4 g/dL — ABNORMAL LOW (ref 3.5–5.0)
Anion gap: 9 (ref 5–15)
BILIRUBIN TOTAL: 0.7 mg/dL (ref 0.3–1.2)
BUN: 11 mg/dL (ref 8–23)
CALCIUM: 8.6 mg/dL — AB (ref 8.9–10.3)
CHLORIDE: 95 mmol/L — AB (ref 98–111)
CO2: 27 mmol/L (ref 22–32)
CREATININE: 0.53 mg/dL — AB (ref 0.61–1.24)
Glucose, Bld: 146 mg/dL — ABNORMAL HIGH (ref 70–99)
Potassium: 3.1 mmol/L — ABNORMAL LOW (ref 3.5–5.1)
Sodium: 131 mmol/L — ABNORMAL LOW (ref 135–145)
TOTAL PROTEIN: 6.8 g/dL (ref 6.5–8.1)

## 2018-07-05 MED ORDER — SODIUM CHLORIDE 0.9 % IV SOLN
Freq: Once | INTRAVENOUS | Status: AC
  Administered 2018-07-05: 11:00:00 via INTRAVENOUS
  Filled 2018-07-05: qty 1000

## 2018-07-05 MED ORDER — HEPARIN SOD (PORK) LOCK FLUSH 100 UNIT/ML IV SOLN
500.0000 [IU] | Freq: Once | INTRAVENOUS | Status: AC
  Administered 2018-07-05: 500 [IU] via INTRAVENOUS
  Filled 2018-07-05: qty 5

## 2018-07-05 MED ORDER — POTASSIUM CHLORIDE CRYS ER 20 MEQ PO TBCR: 20.0000 meq | EXTENDED_RELEASE_TABLET | Freq: Every day | ORAL | 0 refills | Status: DC

## 2018-07-05 MED ORDER — SODIUM CHLORIDE 0.9 % IV SOLN
1400.0000 mg | Freq: Once | INTRAVENOUS | Status: AC
  Administered 2018-07-05: 1400 mg via INTRAVENOUS
  Filled 2018-07-05: qty 36.82

## 2018-07-05 MED ORDER — SODIUM CHLORIDE 0.9% FLUSH
10.0000 mL | INTRAVENOUS | Status: DC | PRN
  Administered 2018-07-05: 10 mL via INTRAVENOUS
  Filled 2018-07-05: qty 10

## 2018-07-05 MED ORDER — HEPARIN SOD (PORK) LOCK FLUSH 100 UNIT/ML IV SOLN: 500.0000 [IU] | Freq: Once | INTRAVENOUS | Status: DC | PRN

## 2018-07-05 MED ORDER — PROCHLORPERAZINE MALEATE 10 MG PO TABS
10.0000 mg | ORAL_TABLET | Freq: Once | ORAL | Status: AC
  Administered 2018-07-05: 10 mg via ORAL
  Filled 2018-07-05: qty 1

## 2018-07-05 MED ORDER — PACLITAXEL PROTEIN-BOUND CHEMO INJECTION 100 MG
100.0000 mg/m2 | Freq: Once | INTRAVENOUS | Status: AC
  Administered 2018-07-05: 175 mg via INTRAVENOUS
  Filled 2018-07-05: qty 35

## 2018-07-05 MED ORDER — SODIUM CHLORIDE 0.9 % IV SOLN
Freq: Once | INTRAVENOUS | Status: AC
  Administered 2018-07-05: 11:00:00 via INTRAVENOUS
  Filled 2018-07-05: qty 250

## 2018-07-05 NOTE — Progress Notes (Signed)
No new changes noted today 

## 2018-07-05 NOTE — Telephone Encounter (Signed)
...     Ref Range & Units 08:53 (07/05/18) 7d ago (06/28/18) 3wk ago (06/14/18) 4wk ago (06/07/18) 4wk ago (06/06/18) 30mo ago (05/31/18) 3mo ago (05/24/18)  Potassium 3.5 - 5.1 mmol/L 3.1Low   3.9  3.3Low   4.3

## 2018-07-06 LAB — CANCER ANTIGEN 19-9: CAN 19-9: 213 U/mL — AB (ref 0–35)

## 2018-07-08 NOTE — Progress Notes (Signed)
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 During his recent appointment with primary care doctor he was found to have jaundice. On admission patient was noted to have a total bilirubin of 16 along with alkaline phosphatase of 1507 and elevated AST and ALT respectively. CBC showed white count of 12.4 and H&H of 9.1/25 with an MCV of 103 and a platelet count of 362. CT abdomen showed 2.2 x 2.9 cm mass in the pancreatic head concerning for pancreatic neoplasm. Double duct sign with intra-and extrahepatic ductal dilatation and dilatation of the pancreatic duct. Peripancreatic fluid collection versus necrotic lesions including a dominant 4.1 x 3.8 cm due to the pancreatic head with mass-effect in the distal bile duct. Moderate ascites. CT chest showed solitary 1.2 cm right upper lobe nodule-solitary metastases versus primary lung cancer.Gastrohepatic ligament adenopathyCA-19-9was elevated at 1988  Dr. Bunnie Philips ERCP with stent placement and biopieswhich showed adenocarcinoma which appears to be invading the duodenum and clinically from the pancreas.  Cycle 1 of palliative single agent gemcitabine started on 05/17/2018   Interval history-he is feeling well overall.  His appetite is good and he has not lost any further weight.  He is  independent of his ADLs.  Leg swelling has resolved.  Denies any abdominal pain  ECOG PS- 2 Pain scale- 0 Opioid associated constipation- no  Review of systems- Review of Systems  Constitutional: Positive for malaise/fatigue. Negative for chills, fever and weight loss.  HENT: Negative for congestion, ear discharge and nosebleeds.   Eyes: Negative for blurred vision.  Respiratory: Negative for cough, hemoptysis, sputum production, shortness of breath and wheezing.   Cardiovascular: Negative for chest pain, palpitations, orthopnea and claudication.  Gastrointestinal: Negative for abdominal pain, blood in stool, constipation, diarrhea, heartburn, melena, nausea and vomiting.  Genitourinary: Negative for dysuria, flank pain, frequency, hematuria and urgency.  Musculoskeletal: Negative for back pain, joint pain and myalgias.  Skin: Negative for rash.  Neurological: Negative for dizziness, tingling, focal weakness, seizures, weakness and headaches.  Endo/Heme/Allergies: Does not bruise/bleed easily.  Psychiatric/Behavioral: Negative for depression and suicidal ideas. The patient does not have insomnia.        No Known Allergies   Past Medical History:  Diagnosis Date  . COPD (chronic obstructive pulmonary disease) ([place])   . Diabetes mellitus without complication ([place])   . Hypertension   . Shortness of breath dyspnea      Past Surgical History:  Procedure Laterality Date  . COLON SURGERY     "twisted intestine"  . ERCP N/A 04/26/2018   Procedure: ENDOSCOPIC RETROGRADE CHOLANGIOPANCREATOGRAPHY (ERCP);  Surgeon: Lucilla Lame, MD;  Location: [facility] ENDOSCOPY;  Service: Endoscopy;  Laterality: N/A;  . HERNIA MESH REMOVAL Right   . INGUINAL HERNIA REPAIR Left 12/04/2014   Procedure: HERNIA REPAIR INGUINAL ADULT;  Surgeon: Rochel Brome, MD;  Location: [facility] ORS;  Service: General;  Laterality: Left;  . PORTA CATH INSERTION N/A 05/16/2018   Procedure: PORTA CATH INSERTION;  Surgeon: Algernon Huxley, MD;  Location: [place] CV LAB;  Service: Cardiovascular;  Laterality: N/A;    Social History   Socioeconomic History  . Marital status: Married    Spouse name: Not on file  . Number of children: Not on file  . Years of education: Not on file  . Highest education level: Not on file  Occupational History  . Not on file  Social Needs  . Financial resource strain: Not on file  . Food insecurity:    Worry: Patient refused    Inability: Patient refused  . Transportation needs:    Medical: Patient refused    Non-medical: Patient refused  Tobacco Use  . Smoking status: Current Every Day Smoker    Packs/day: 1.00    Years: 30.00    Pack years: 30.00  . Smokeless tobacco: Former Network engineer and Sexual Activity  . Alcohol use: No  . Drug use: No  . Sexual activity: Never  Lifestyle  . Physical activity:    Days per week: Patient refused    Minutes per session: Patient refused  . Stress: Not on file  Relationships  . Social connections:    Talks on phone: Patient refused    Gets together: Patient refused    Attends religious service: Patient refused    Active member of club or organization: Patient refused    Attends meetings of clubs or organizations: Patient refused    Relationship status: Patient refused  . Intimate partner violence:    Fear of current or ex partner: Patient refused    Emotionally abused: Patient refused    Physically abused: Patient refused    Forced sexual activity: Patient refused  Other Topics Concern  . Not on file  Social History Narrative  . Not on file    Family History  Problem Relation Age of Onset  . Colon cancer Mother   . Melanoma Father      Current Outpatient Medications:  .  Fluticasone-Salmeterol (ADVAIR) 250-50 MCG/DOSE AEPB, Inhale 1 puff into the lungs 2 (two) times daily. , Disp: , Rfl:  .  insulin aspart (NOVOLOG) 100 UNIT/ML injection, 5 units subcutaneous prior to meals if sugars greater than 150, Disp:  10 mL, Rfl: 11 .  insulin lispro (HUMALOG KWIKPEN) 100 UNIT/ML KwikPen, Use 5 units at mealtime if blood glucose is > 150, Disp: 15 mL, Rfl: 11 .  LANTUS SOLOSTAR 100 UNIT/ML Solostar Pen, 6 units suncutanous injection daily, Disp: 15 mL, Rfl: 12 .  LORazepam (ATIVAN) 1 MG tablet, Take 1 tablet (1 mg total) by mouth every 8 (eight) hours as needed for anxiety or sleep., Disp: 30 tablet, Rfl: 0 .  potassium chloride 20 MEQ/15ML (10%) SOLN, TAKE 15 MLS BY MOUTH EVERY DAY FOR 4 DAYS, Disp: 60 mL, Rfl: 0 .  potassium chloride SA (K-DUR,KLOR-CON) 20 MEQ tablet, Take 1 tablet (20 mEq total) by mouth daily., Disp: 30 tablet, Rfl: 0 .  traZODone (DESYREL) 100 MG tablet, Take 1 tablet (100 mg total) by mouth at bedtime., Disp: 30 tablet, Rfl: 0 .  vitamin B-12 (CYANOCOBALAMIN) 1000 MCG tablet, Take 1,000 mcg by mouth daily., Disp: , Rfl:  .  docusate sodium (COLACE) 100 MG capsule, Take 1 capsule (100 mg total) by mouth daily as needed for mild constipation. (Patient not taking: Reported on 05/09/2018), Disp: 60 capsule, Rfl: 0 .  lidocaine-prilocaine (EMLA) cream, Apply to affected area once (Patient not taking: Reported on 06/28/2018), Disp: 30 g, Rfl: 3 .  Melatonin 5 MG TABS, Take 1 tablet (5 mg total) by mouth at bedtime. (Patient not taking: Reported on 05/09/2018), Disp: 30 tablet, Rfl: 0 .  ondansetron (ZOFRAN) 8 MG tablet, Take 1 tablet (8 mg total) by mouth 2 (two) times daily as needed (Nausea or vomiting). (Patient not taking: Reported on 05/17/2018), Disp: 30 tablet, Rfl: 1 .  prochlorperazine (COMPAZINE) 10 MG tablet, Take 1 tablet (10 mg total) by mouth every 6 (six) hours as needed (Nausea or vomiting). (Patient not taking: Reported on 07/05/2018), Disp: 30 tablet, Rfl: 1 No current facility-administered medications for this visit.   Facility-Administered Medications Ordered in Other Visits:  .  sodium chloride flush (NS) 0.9 % injection 10 mL, 10 mL, Intracatheter, PRN, Sindy Guadeloupe, MD .   sodium chloride flush (NS) 0.9 % injection 10 mL, 10 mL, Intravenous, PRN, Sindy Guadeloupe, MD, 10 mL at 06/28/18 1246  Physical exam:  Vitals:   07/05/18 0918  BP: 102/62  Pulse: 88  Resp: 18  Temp: 97.9 F (36.6 C)  SpO2: 98%  Weight: 126 lb 9.6 oz (57.4 kg)  Height: 6' (1.829 m)   Physical Exam Constitutional:      Comments: Thin man sitting in a wheelchair.  Appears in no acute distress  HENT:     Head: Normocephalic and atraumatic.  Eyes:     Pupils: Pupils are equal, round, and reactive to light.  Neck:     Musculoskeletal: Normal range of motion.  Cardiovascular:     Rate and Rhythm: Normal rate and regular rhythm.     Heart sounds: Normal heart sounds.  Pulmonary:     Effort: Pulmonary effort is normal.     Breath sounds: Normal breath sounds.  Abdominal:     General: Bowel sounds are normal.     Palpations: Abdomen is soft.  Skin:    General: Skin is warm and dry.  Neurological:     Mental Status: He is alert and oriented to person, place, and time.      CMP Latest Ref Rng & Units 07/05/2018  Glucose 70 - 99 mg/dL 146(H)  BUN 8 - 23 mg/dL 11  Creatinine 0.61 - 1.24 mg/dL 0.53(L)  Sodium 135 - 145 mmol/L 131(L)  Potassium 3.5 - 5.1 mmol/L 3.1(L)  Chloride 98 - 111 mmol/L 95(L)  CO2 22 - 32 mmol/L 27  Calcium 8.9 - 10.3 mg/dL 8.6(L)  Total Protein 6.5 - 8.1 g/dL 6.8  Total Bilirubin 0.3 - 1.2 mg/dL 0.7  Alkaline Phos 38 - 126 U/L 91  AST 15 - 41 U/L 23  ALT 0 - 44 U/L 18   CBC Latest Ref Rng & Units 07/05/2018  WBC 4.0 - 10.5 K/uL 5.2  Hemoglobin 13.0 - 17.0 g/dL 11.4(L)  Hematocrit 39.0 - 52.0 % 34.6(L)  Platelets 150 - 400 K/uL 239      Assessment and plan- Patient is a 76 y.o. 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  Is here for on treatment assessment prior to cycle 3-day 8 of gemcitabine and Abraxane  I have been holding Abraxane until now since he had elevated bilirubin  at the time of presentation.  His bilirubin is now normalized now that we will be adding Abraxane to his gemcitabine regimen.  I will be considering this as his cycle 3-day 8.  Counts are okay to proceed with gemcitabine and Abraxane today.  He will not be receiving  chemotherapy next week CBC CMP and CA-19-9 for cycle 4-day 1 of gemcitabine and Abraxane.  And to get repeat scans after 4 cycles.  Overall his CA-19-9 has come down significantly getting response to treatment  Mild chronic hyponatremia: Stable  Blood sugars are better controlled at this time.  Hypokalemia: We will give him 40 mEq of IV potassium today   Visit Diagnosis 1. Encounter for antineoplastic chemotherapy   2. Malignant neoplasm of pancreas, unspecified location of malignancy ([place])   3. Hypokalemia   4. Hyponatremia      Dr. Randa Evens, MD, MPH [facility] [phone] 07/08/2018 8:36 AM

## 2018-07-11 ENCOUNTER — Other Ambulatory Visit: Payer: Self-pay | Admitting: Oncology

## 2018-07-11 DIAGNOSIS — C259 Malignant neoplasm of pancreas, unspecified: Secondary | ICD-10-CM

## 2018-07-19 ENCOUNTER — Inpatient Hospital Stay: Payer: Medicare Other

## 2018-07-19 ENCOUNTER — Inpatient Hospital Stay (HOSPITAL_BASED_OUTPATIENT_CLINIC_OR_DEPARTMENT_OTHER): Payer: Medicare Other | Admitting: Oncology

## 2018-07-19 ENCOUNTER — Encounter: Payer: Self-pay | Admitting: Oncology

## 2018-07-19 ENCOUNTER — Ambulatory Visit
Admission: RE | Admit: 2018-07-19 | Discharge: 2018-07-19 | Disposition: A | Discharge status: Home / Self Care | Payer: Medicare Other | Source: Ambulatory Visit | Attending: Oncology | Admitting: Oncology

## 2018-07-19 VITALS — BP 103/44 | HR 78 | Temp 97.1°F | Resp 18 | Ht 72.0 in | Wt 131.6 lb

## 2018-07-19 DIAGNOSIS — R05 Cough: Secondary | ICD-10-CM

## 2018-07-19 DIAGNOSIS — C259 Malignant neoplasm of pancreas, unspecified: Secondary | ICD-10-CM

## 2018-07-19 DIAGNOSIS — F17210 Nicotine dependence, cigarettes, uncomplicated: Secondary | ICD-10-CM

## 2018-07-19 DIAGNOSIS — Z79899 Other long term (current) drug therapy: Secondary | ICD-10-CM

## 2018-07-19 DIAGNOSIS — C779 Secondary and unspecified malignant neoplasm of lymph node, unspecified: Secondary | ICD-10-CM | POA: Diagnosis not present

## 2018-07-19 DIAGNOSIS — R059 Cough, unspecified: Secondary | ICD-10-CM

## 2018-07-19 DIAGNOSIS — C250 Malignant neoplasm of head of pancreas: Secondary | ICD-10-CM

## 2018-07-19 DIAGNOSIS — E871 Hypo-osmolality and hyponatremia: Secondary | ICD-10-CM

## 2018-07-19 DIAGNOSIS — E876 Hypokalemia: Secondary | ICD-10-CM | POA: Diagnosis not present

## 2018-07-19 DIAGNOSIS — Z5111 Encounter for antineoplastic chemotherapy: Secondary | ICD-10-CM

## 2018-07-19 LAB — CBC WITH DIFFERENTIAL/PLATELET
Abs Immature Granulocytes: 0.06 10*3/uL (ref 0.00–0.07)
Basophils Absolute: 0 10*3/uL (ref 0.0–0.1)
Basophils Relative: 0 %
EOS ABS: 0 10*3/uL (ref 0.0–0.5)
Eosinophils Relative: 0 %
HCT: 33.4 % — ABNORMAL LOW (ref 39.0–52.0)
Hemoglobin: 10.9 g/dL — ABNORMAL LOW (ref 13.0–17.0)
Immature Granulocytes: 1 %
LYMPHS ABS: 1.1 10*3/uL (ref 0.7–4.0)
Lymphocytes Relative: 9 %
MCH: 31.4 pg (ref 26.0–34.0)
MCHC: 32.6 g/dL (ref 30.0–36.0)
MCV: 96.3 fL (ref 80.0–100.0)
Monocytes Absolute: 1.3 10*3/uL — ABNORMAL HIGH (ref 0.1–1.0)
Monocytes Relative: 11 %
Neutro Abs: 9.1 10*3/uL — ABNORMAL HIGH (ref 1.7–7.7)
Neutrophils Relative %: 79 %
Platelets: 448 10*3/uL — ABNORMAL HIGH (ref 150–400)
RBC: 3.47 MIL/uL — ABNORMAL LOW (ref 4.22–5.81)
RDW: 15.6 % — ABNORMAL HIGH (ref 11.5–15.5)
WBC: 11.6 10*3/uL — ABNORMAL HIGH (ref 4.0–10.5)
nRBC: 0 % (ref 0.0–0.2)

## 2018-07-19 LAB — COMPREHENSIVE METABOLIC PANEL
ALT: 17 U/L (ref 0–44)
AST: 23 U/L (ref 15–41)
Albumin: 3.2 g/dL — ABNORMAL LOW (ref 3.5–5.0)
Alkaline Phosphatase: 94 U/L (ref 38–126)
Anion gap: 10 (ref 5–15)
BUN: 11 mg/dL (ref 8–23)
CO2: 26 mmol/L (ref 22–32)
CREATININE: 0.55 mg/dL — AB (ref 0.61–1.24)
Calcium: 8.8 mg/dL — ABNORMAL LOW (ref 8.9–10.3)
Chloride: 93 mmol/L — ABNORMAL LOW (ref 98–111)
GFR calc Af Amer: >60 mL/min (ref >60)
GFR calc non Af Amer: >60 mL/min (ref >60)
Glucose, Bld: 208 mg/dL — ABNORMAL HIGH (ref 70–99)
Potassium: 3.8 mmol/L (ref 3.5–5.1)
Sodium: 129 mmol/L — ABNORMAL LOW (ref 135–145)
Total Bilirubin: 0.8 mg/dL (ref 0.3–1.2)
Total Protein: 6.8 g/dL (ref 6.5–8.1)

## 2018-07-19 MED ORDER — HEPARIN SOD (PORK) LOCK FLUSH 100 UNIT/ML IV SOLN: 500.0000 [IU] | Freq: Once | INTRAVENOUS | Status: DC | PRN

## 2018-07-19 MED ORDER — PROCHLORPERAZINE MALEATE 10 MG PO TABS
10.0000 mg | ORAL_TABLET | Freq: Once | ORAL | Status: AC
  Administered 2018-07-19: 10 mg via ORAL
  Filled 2018-07-19: qty 1

## 2018-07-19 MED ORDER — PACLITAXEL PROTEIN-BOUND CHEMO INJECTION 100 MG
100.0000 mg/m2 | Freq: Once | INTRAVENOUS | Status: AC
  Administered 2018-07-19: 175 mg via INTRAVENOUS
  Filled 2018-07-19: qty 35

## 2018-07-19 MED ORDER — SODIUM CHLORIDE 0.9 % IV SOLN
Freq: Once | INTRAVENOUS | Status: AC
  Administered 2018-07-19: 10:00:00 via INTRAVENOUS
  Filled 2018-07-19: qty 250

## 2018-07-19 MED ORDER — SODIUM CHLORIDE 0.9% FLUSH
10.0000 mL | Freq: Once | INTRAVENOUS | Status: AC
  Administered 2018-07-19: 10 mL via INTRAVENOUS
  Filled 2018-07-19: qty 10

## 2018-07-19 MED ORDER — SODIUM CHLORIDE 0.9 % IV SOLN
1400.0000 mg | Freq: Once | INTRAVENOUS | Status: AC
  Administered 2018-07-19: 1400 mg via INTRAVENOUS
  Filled 2018-07-19: qty 26.3

## 2018-07-19 MED ORDER — HEPARIN SOD (PORK) LOCK FLUSH 100 UNIT/ML IV SOLN
500.0000 [IU] | Freq: Once | INTRAVENOUS | Status: AC
  Administered 2018-07-19: 500 [IU] via INTRAVENOUS
  Filled 2018-07-19: qty 5

## 2018-07-19 MED ORDER — SODIUM CHLORIDE 0.9 % IV SOLN
INTRAVENOUS | Status: DC
  Administered 2018-07-19: 10:00:00 via INTRAVENOUS
  Filled 2018-07-19 (×2): qty 250

## 2018-07-19 NOTE — Addendum Note (Signed)
Addended by: Luella Cook on: 07/19/2018 10:19 AM   Modules accepted: Orders

## 2018-07-19 NOTE — Progress Notes (Signed)
Hematology/Oncology Consult note [facility]  [phone] [phone]  Patient Care Team: Kirk Ruths, MD as PCP - General (Internal Medicine) Clent Jacks, RN as Registered Nurse   Name of the patient: Bryan Hicks  [id number]  1941/02/05  I 60 when his last TSH was normal and that we are fine with a TSH as well date of visit: 07/19/18  Diagnosis- Stage IV pancreatic cancerT2N1M1with lymph nodesmetsand possible lung metastases  Chief complaint/ Reason for visit-on treatment assessment prior to cycle 4-day 1 of gemcitabine and Abraxane  Heme/Onc history: patient is a 77 year old male who presented to the [facility] 10/3/2019with symptoms of generalized weakness and bilateral lower extremity swelling. During his recent appointment with primary care doctor he was found to have jaundice. On admission patient was noted to have a total bilirubin of 16 along with alkaline phosphatase of 1507 and elevated AST and ALT respectively. CBC showed white count of 12.4 and H&H of 9.1/25 with an MCV of 103 and a platelet count of 362. CT abdomen showed 2.2 x 2.9 cm mass in the pancreatic head concerning for pancreatic neoplasm. Double duct sign with intra-and extrahepatic ductal dilatation and dilatation of the pancreatic duct. Peripancreatic fluid collection versus necrotic lesions including a dominant 4.1 x 3.8 cm due to the pancreatic head with mass-effect in the distal bile duct. Moderate ascites. CT chest showed solitary 1.2 cm right upper lobe nodule-solitary metastases versus primary lung cancer.Gastrohepatic ligament adenopathyCA-19-9was elevated at 1988  Dr. Bunnie Philips ERCP with stent placement and biopieswhich showed adenocarcinoma which appears to be invading the duodenum and clinically from the pancreas.  Cycle 1 of palliative single agent gemcitabine started on 05/17/2018.  Abraxane was added with cycle 3-day 8 after  bilirubin normalized   Interval history-patient reports he slipped out of his bed and fell to the floor.  Did not hurt his head.  He has been having increasing cough over the last 1 week.  Minimal expectoration.  Denies any fever.  Denies any abdominal pain.  ECOG PS- 2 Pain scale- 0 Opioid associated constipation- no  Review of systems- Review of Systems  Constitutional: Positive for malaise/fatigue. Negative for chills, fever and weight loss.  HENT: Negative for congestion, ear discharge and nosebleeds.   Eyes: Negative for blurred vision.  Respiratory: Positive for cough. Negative for hemoptysis, sputum production, shortness of breath and wheezing.   Cardiovascular: Negative for chest pain, palpitations, orthopnea and claudication.  Gastrointestinal: Negative for abdominal pain, blood in stool, constipation, diarrhea, heartburn, melena, nausea and vomiting.  Genitourinary: Negative for dysuria, flank pain, frequency, hematuria and urgency.  Musculoskeletal: Negative for back pain, joint pain and myalgias.  Skin: Negative for rash.  Neurological: Negative for dizziness, tingling, focal weakness, seizures, weakness and headaches.  Endo/Heme/Allergies: Does not bruise/bleed easily.  Psychiatric/Behavioral: Negative for depression and suicidal ideas. The patient does not have insomnia.       No Known Allergies   Past Medical History:  Diagnosis Date  . COPD (chronic obstructive pulmonary disease) ([place])   . Diabetes mellitus without complication ([place])   . Hypertension   . Shortness of breath dyspnea      Past Surgical History:  Procedure Laterality Date  . COLON SURGERY     "twisted intestine"  . ERCP N/A 04/26/2018   Procedure: ENDOSCOPIC RETROGRADE CHOLANGIOPANCREATOGRAPHY (ERCP);  Surgeon: Lucilla Lame, MD;  Location: [facility] ENDOSCOPY;  Service: Endoscopy;  Laterality: N/A;  . HERNIA MESH REMOVAL Right   . INGUINAL HERNIA  REPAIR Left 12/04/2014   Procedure: HERNIA REPAIR  INGUINAL ADULT;  Surgeon: Rochel Brome, MD;  Location: [facility] ORS;  Service: General;  Laterality: Left;  . PORTA CATH INSERTION N/A 05/16/2018   Procedure: PORTA CATH INSERTION;  Surgeon: Algernon Huxley, MD;  Location: [place] [facility] LAB;  Service: Cardiovascular;  Laterality: N/A;    Social History   Socioeconomic History  . Marital status: Married    Spouse name: Not on file  . Number of children: Not on file  . Years of education: Not on file  . Highest education level: Not on file  Occupational History  . Not on file  Social Needs  . Financial resource strain: Not on file  . Food insecurity:    Worry: Patient refused    Inability: Patient refused  . Transportation needs:    Medical: Patient refused    Non-medical: Patient refused  Tobacco Use  . Smoking status: Current Every Day Smoker    Packs/day: 1.00    Years: 30.00    Pack years: 30.00  . Smokeless tobacco: Former Network engineer and Sexual Activity  . Alcohol use: No  . Drug use: No  . Sexual activity: Never  Lifestyle  . Physical activity:    Days per week: Patient refused    Minutes per session: Patient refused  . Stress: Not on file  Relationships  . Social connections:    Talks on phone: Patient refused    Gets together: Patient refused    Attends religious service: Patient refused    Active member of club or organization: Patient refused    Attends meetings of clubs or organizations: Patient refused    Relationship status: Patient refused  . Intimate partner violence:    Fear of current or ex partner: Patient refused    Emotionally abused: Patient refused    Physically abused: Patient refused    Forced sexual activity: Patient refused  Other Topics Concern  . Not on file  Social History Narrative  . Not on file    Family History  Problem Relation Age of Onset  . Colon cancer Mother   . Melanoma Father      Current Outpatient Medications:  .  docusate sodium (COLACE) 100 MG capsule, Take 1  capsule (100 mg total) by mouth daily as needed for mild constipation. (Patient not taking: Reported on 05/09/2018), Disp: 60 capsule, Rfl: 0 .  Fluticasone-Salmeterol (ADVAIR) 250-50 MCG/DOSE AEPB, Inhale 1 puff into the lungs 2 (two) times daily. , Disp: , Rfl:  .  insulin aspart (NOVOLOG) 100 UNIT/ML injection, 5 units subcutaneous prior to meals if sugars greater than 150, Disp: 10 mL, Rfl: 11 .  insulin lispro (HUMALOG KWIKPEN) 100 UNIT/ML KwikPen, Use 5 units at mealtime if blood glucose is > 150, Disp: 15 mL, Rfl: 11 .  LANTUS SOLOSTAR 100 UNIT/ML Solostar Pen, 6 units suncutanous injection daily, Disp: 15 mL, Rfl: 12 .  lidocaine-prilocaine (EMLA) cream, Apply to affected area once (Patient not taking: Reported on 06/28/2018), Disp: 30 g, Rfl: 3 .  LORazepam (ATIVAN) 1 MG tablet, Take 1 tablet (1 mg total) by mouth every 8 (eight) hours as needed for anxiety or sleep., Disp: 30 tablet, Rfl: 0 .  Melatonin 5 MG TABS, Take 1 tablet (5 mg total) by mouth at bedtime. (Patient not taking: Reported on 05/09/2018), Disp: 30 tablet, Rfl: 0 .  ondansetron (ZOFRAN) 8 MG tablet, Take 1 tablet (8 mg total) by mouth 2 (two) times daily as  needed (Nausea or vomiting). (Patient not taking: Reported on 05/17/2018), Disp: 30 tablet, Rfl: 1 .  potassium chloride 20 MEQ/15ML (10%) SOLN, TAKE 15 MLS BY MOUTH EVERY DAY FOR 4 DAYS, Disp: 60 mL, Rfl: 0 .  potassium chloride SA (K-DUR,KLOR-CON) 20 MEQ tablet, Take 1 tablet (20 mEq total) by mouth daily., Disp: 30 tablet, Rfl: 0 .  prochlorperazine (COMPAZINE) 10 MG tablet, TAKE 1 TABLET(10 MG) BY MOUTH EVERY 6 HOURS AS NEEDED FOR NAUSEA OR VOMITING, Disp: 30 tablet, Rfl: 1 .  traZODone (DESYREL) 100 MG tablet, Take 1 tablet (100 mg total) by mouth at bedtime., Disp: 30 tablet, Rfl: 0 .  vitamin B-12 (CYANOCOBALAMIN) 1000 MCG tablet, Take 1,000 mcg by mouth daily., Disp: , Rfl:  No current facility-administered medications for this visit.   Facility-Administered  Medications Ordered in Other Visits:  .  sodium chloride flush (NS) 0.9 % injection 10 mL, 10 mL, Intracatheter, PRN, Sindy Guadeloupe, MD .  sodium chloride flush (NS) 0.9 % injection 10 mL, 10 mL, Intravenous, PRN, Sindy Guadeloupe, MD, 10 mL at 06/28/18 1246  Physical exam:  Vitals:   07/19/18 0902 07/19/18 0904  BP:  (!) 103/44  Pulse: 78   Resp: 18   Temp: (!) 97.1 F (36.2 C)   TempSrc: Tympanic   SpO2: 94%   Weight: 131 lb 9.6 oz (59.7 kg)   Height: 6' (1.829 m)    Physical Exam Constitutional:      Comments: Thin man sitting in a wheelchair.  Appears in no acute distress  HENT:     Head: Normocephalic and atraumatic.  Eyes:     Pupils: Pupils are equal, round, and reactive to light.  Neck:     Musculoskeletal: Normal range of motion.  Cardiovascular:     Rate and Rhythm: Normal rate and regular rhythm.     Heart sounds: Normal heart sounds.  Pulmonary:     Effort: Pulmonary effort is normal.     Breath sounds: Normal breath sounds.  Abdominal:     General: Bowel sounds are normal.     Palpations: Abdomen is soft.  Musculoskeletal:     Right lower leg: No edema.     Left lower leg: No edema.  Skin:    General: Skin is warm and dry.  Neurological:     Mental Status: He is alert and oriented to person, place, and time.      CMP Latest Ref Rng & Units 07/19/2018  Glucose 70 - 99 mg/dL 208(H)  BUN 8 - 23 mg/dL 11  Creatinine 0.61 - 1.24 mg/dL 0.55(L)  Sodium 135 - 145 mmol/L 129(L)  Potassium 3.5 - 5.1 mmol/L 3.8  Chloride 98 - 111 mmol/L 93(L)  CO2 22 - 32 mmol/L 26  Calcium 8.9 - 10.3 mg/dL 8.8(L)  Total Protein 6.5 - 8.1 g/dL 6.8  Total Bilirubin 0.3 - 1.2 mg/dL 0.8  Alkaline Phos 38 - 126 U/L 94  AST 15 - 41 U/L 23  ALT 0 - 44 U/L 17   CBC Latest Ref Rng & Units 07/19/2018  WBC 4.0 - 10.5 K/uL 11.6(H)  Hemoglobin 13.0 - 17.0 g/dL 10.9(L)  Hematocrit 39.0 - 52.0 % 33.4(L)  Platelets 150 - 400 K/uL 448(H)      Assessment and plan- Patient is a 77  y.o. 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    He is here for on treatment assessment prior  to cycle 4-day 1 of gemcitabine and Abraxane  Counts okay to proceed with cycle 4-day 1 of gemcitabine and Abraxane today.  He will proceed for chemotherapy next week cycle 4-day 8 with CBC and CMP.  I will see him back in 3 weeks time with CBC CMP and CA-19-9 for cycle 5-day 1 of gemcitabine and Abraxane.  I will get interim CT chest abdomen and pelvis with contrast prior to cycle 5.  Hyponatremia: We will plan to give him 1 L of IV fluids today.  Cough: His lungs sound clear to auscultation.  He is not short of breath at rest.  I will get a chest x-ray today and if there is any concern for pneumonia I will start him on antibiotics at that time   Visit Diagnosis 1. Encounter for antineoplastic chemotherapy   2. Malignant neoplasm of pancreas, unspecified location of malignancy ([place])   3. Cough   4. Hyponatremia      Dr. Randa Evens, MD, MPH [facility] [phone] 07/19/2018 9:41 AM

## 2018-07-19 NOTE — Progress Notes (Signed)
The patient is wheezing today O2 stat 94% with cough with increase SOB. The patient also having swelling noted left eye. The patient also had a fall and has been hurting right side. Per daughter the patient has been having incontinence with bowel and bladder. Also has been sleeping all the time with out eating.

## 2018-07-26 ENCOUNTER — Inpatient Hospital Stay: Payer: Medicare Other | Attending: Oncology

## 2018-07-26 ENCOUNTER — Inpatient Hospital Stay: Payer: Medicare Other

## 2018-07-26 VITALS — BP 148/90 | HR 90 | Resp 18

## 2018-07-26 DIAGNOSIS — G4700 Insomnia, unspecified: Secondary | ICD-10-CM | POA: Insufficient documentation

## 2018-07-26 DIAGNOSIS — C259 Malignant neoplasm of pancreas, unspecified: Secondary | ICD-10-CM | POA: Insufficient documentation

## 2018-07-26 DIAGNOSIS — E1165 Type 2 diabetes mellitus with hyperglycemia: Secondary | ICD-10-CM | POA: Diagnosis not present

## 2018-07-26 DIAGNOSIS — Z515 Encounter for palliative care: Secondary | ICD-10-CM | POA: Diagnosis not present

## 2018-07-26 DIAGNOSIS — E871 Hypo-osmolality and hyponatremia: Secondary | ICD-10-CM

## 2018-07-26 LAB — CBC WITH DIFFERENTIAL/PLATELET
Abs Immature Granulocytes: 0.02 10*3/uL (ref 0.00–0.07)
Basophils Absolute: 0.1 10*3/uL (ref 0.0–0.1)
Basophils Relative: 1 %
Eosinophils Absolute: 0 10*3/uL (ref 0.0–0.5)
Eosinophils Relative: 0 %
HEMATOCRIT: 31.9 % — AB (ref 39.0–52.0)
Hemoglobin: 10.5 g/dL — ABNORMAL LOW (ref 13.0–17.0)
Immature Granulocytes: 0 %
Lymphocytes Relative: 22 %
Lymphs Abs: 1.2 10*3/uL (ref 0.7–4.0)
MCH: 31.4 pg (ref 26.0–34.0)
MCHC: 32.9 g/dL (ref 30.0–36.0)
MCV: 95.5 fL (ref 80.0–100.0)
MONO ABS: 0.5 10*3/uL (ref 0.1–1.0)
MONOS PCT: 10 %
NEUTROS ABS: 3.7 10*3/uL (ref 1.7–7.7)
NEUTROS PCT: 67 %
Platelets: 275 10*3/uL (ref 150–400)
RBC: 3.34 MIL/uL — ABNORMAL LOW (ref 4.22–5.81)
RDW: 15 % (ref 11.5–15.5)
WBC: 5.5 10*3/uL (ref 4.0–10.5)
nRBC: 0 % (ref 0.0–0.2)

## 2018-07-26 LAB — COMPREHENSIVE METABOLIC PANEL
ALT: 21 U/L (ref 0–44)
AST: 27 U/L (ref 15–41)
Albumin: 3.1 g/dL — ABNORMAL LOW (ref 3.5–5.0)
Alkaline Phosphatase: 101 U/L (ref 38–126)
Anion gap: 8 (ref 5–15)
BUN: 12 mg/dL (ref 8–23)
CO2: 28 mmol/L (ref 22–32)
Calcium: 8.7 mg/dL — ABNORMAL LOW (ref 8.9–10.3)
Chloride: 92 mmol/L — ABNORMAL LOW (ref 98–111)
Creatinine, Ser: 0.48 mg/dL — ABNORMAL LOW (ref 0.61–1.24)
GFR calc Af Amer: >60 mL/min (ref >60)
GFR calc non Af Amer: >60 mL/min (ref >60)
GLUCOSE: 223 mg/dL — AB (ref 70–99)
Potassium: 3.8 mmol/L (ref 3.5–5.1)
Sodium: 128 mmol/L — ABNORMAL LOW (ref 135–145)
Total Bilirubin: 0.6 mg/dL (ref 0.3–1.2)
Total Protein: 6.6 g/dL (ref 6.5–8.1)

## 2018-07-26 MED ORDER — SODIUM CHLORIDE 0.9 % IV SOLN
1400.0000 mg | Freq: Once | INTRAVENOUS | Status: AC
  Administered 2018-07-26: 1400 mg via INTRAVENOUS
  Filled 2018-07-26: qty 26.3

## 2018-07-26 MED ORDER — SODIUM CHLORIDE 0.9 % IV SOLN
Freq: Once | INTRAVENOUS | Status: AC
  Administered 2018-07-26: 10:00:00 via INTRAVENOUS
  Filled 2018-07-26: qty 250

## 2018-07-26 MED ORDER — HEPARIN SOD (PORK) LOCK FLUSH 100 UNIT/ML IV SOLN
500.0000 [IU] | Freq: Once | INTRAVENOUS | Status: AC
  Administered 2018-07-26: 500 [IU] via INTRAVENOUS

## 2018-07-26 MED ORDER — HEPARIN SOD (PORK) LOCK FLUSH 100 UNIT/ML IV SOLN
INTRAVENOUS | Status: AC
  Filled 2018-07-26: qty 5

## 2018-07-26 MED ORDER — SODIUM CHLORIDE 0.9 % IV SOLN
Freq: Once | INTRAVENOUS | Status: AC
  Administered 2018-07-26: 09:00:00 via INTRAVENOUS
  Filled 2018-07-26: qty 250

## 2018-07-26 MED ORDER — PACLITAXEL PROTEIN-BOUND CHEMO INJECTION 100 MG
100.0000 mg/m2 | Freq: Once | INTRAVENOUS | Status: AC
  Administered 2018-07-26: 175 mg via INTRAVENOUS
  Filled 2018-07-26: qty 35

## 2018-07-26 MED ORDER — PROCHLORPERAZINE MALEATE 10 MG PO TABS
10.0000 mg | ORAL_TABLET | Freq: Once | ORAL | Status: AC
  Administered 2018-07-26: 10 mg via ORAL
  Filled 2018-07-26: qty 1

## 2018-07-29 ENCOUNTER — Telehealth: Payer: Self-pay | Admitting: *Deleted

## 2018-07-29 NOTE — Telephone Encounter (Signed)
Per VO Dr Janese Banks, she would like for Sharion Dove, NP to see patient.

## 2018-07-29 NOTE — Telephone Encounter (Signed)
Patient's daughter called to report that the patient has been experiencing severe insomnia and increasing pain. She is willing to bring him in to symptom management.

## 2018-07-29 NOTE — Telephone Encounter (Signed)
Appointment offered for 2 PM today and Colletta Maryland stated she cannot bring him at that time because she has a hair appointment at 130, offer 3 PM appointment and she refused that as well and asked if she could bring him tomorrow, Appointment for [facility] tomorrow accepted

## 2018-07-30 ENCOUNTER — Other Ambulatory Visit: Payer: Self-pay | Admitting: Oncology

## 2018-07-30 ENCOUNTER — Other Ambulatory Visit: Payer: Self-pay | Admitting: *Deleted

## 2018-07-30 ENCOUNTER — Encounter: Payer: Self-pay | Admitting: Hospice and Palliative Medicine

## 2018-07-30 ENCOUNTER — Inpatient Hospital Stay (HOSPITAL_BASED_OUTPATIENT_CLINIC_OR_DEPARTMENT_OTHER): Payer: Medicare Other | Admitting: Hospice and Palliative Medicine

## 2018-07-30 VITALS — BP 153/95 | HR 100 | Temp 97.8°F | Resp 18 | Ht 72.0 in | Wt 124.6 lb

## 2018-07-30 DIAGNOSIS — Z515 Encounter for palliative care: Secondary | ICD-10-CM | POA: Diagnosis not present

## 2018-07-30 DIAGNOSIS — G4700 Insomnia, unspecified: Secondary | ICD-10-CM

## 2018-07-30 DIAGNOSIS — C259 Malignant neoplasm of pancreas, unspecified: Secondary | ICD-10-CM | POA: Diagnosis not present

## 2018-07-30 MED ORDER — LORAZEPAM 1 MG PO TABS: 1.0000 mg | ORAL_TABLET | Freq: Three times a day (TID) | ORAL | 0 refills | Status: AC | PRN

## 2018-07-30 MED ORDER — POTASSIUM CHLORIDE CRYS ER 20 MEQ PO TBCR: 20.0000 meq | EXTENDED_RELEASE_TABLET | Freq: Every day | ORAL | 0 refills | Status: AC

## 2018-07-30 NOTE — Progress Notes (Signed)
Pt feeling tired,, and occasional stomach pain, not sleeping good but for last 2 days took 2 pills at night of ativan. Wants refill of oxycodone

## 2018-07-30 NOTE — Progress Notes (Signed)
[place]  [phone] [phone]   Name: Bryan Hicks Date: 07/30/2018 MRN: [id number]  DOB: 16-Apr-1941  Patient Care Team: Kirk Ruths, MD as PCP - General (Internal Medicine) Clent Jacks, RN as Registered Nurse    REASON FOR CONSULTATION: Palliative Care consult requested for this 78 y.o. 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  PMH is also significant for COPD, diabetes, and hypertension.  She was recently hospitalized 05/03/2018 to 05/05/2018 for altered mental status likely secondary to hypoglycemia.,  Patient was also treated for fever of unknown origin and acute on chronic anemia requiring blood transfusion.  He was referred to the palliative care clinic for symptom management and further discussion of goals.   SOCIAL HISTORY:    Patient is married and lives at home with his wife and daughter.  Patient has a son that lives in [place].  Patient used to work as a Administrator.  ADVANCE DIRECTIVES:  Not on file  CODE STATUS: DNR  PAST MEDICAL HISTORY: Past Medical History:  Diagnosis Date  . COPD (chronic obstructive pulmonary disease) ([place])   . Diabetes mellitus without complication ([place])   . Hypertension   . Shortness of breath dyspnea     PAST SURGICAL HISTORY:  Past Surgical History:  Procedure Laterality Date  . COLON SURGERY     "twisted intestine"  . ERCP N/A 04/26/2018   Procedure: ENDOSCOPIC RETROGRADE CHOLANGIOPANCREATOGRAPHY (ERCP);  Surgeon: Lucilla Lame, MD;  Location: [facility] ENDOSCOPY;  Service: Endoscopy;  Laterality: N/A;  . HERNIA MESH REMOVAL Right   . INGUINAL HERNIA REPAIR Left 12/04/2014   Procedure: HERNIA REPAIR INGUINAL ADULT;  Surgeon: Rochel Brome, MD;  Location: [facility] ORS;  Service: General;  Laterality:  Left;  . PORTA CATH INSERTION N/A 05/16/2018   Procedure: PORTA CATH INSERTION;  Surgeon: Algernon Huxley, MD;  Location: [place] CV LAB;  Service: Cardiovascular;  Laterality: N/A;    HEMATOLOGY/ONCOLOGY HISTORY:    Malignant neoplasm of pancreas ([place])   05/13/2018 Initial Diagnosis    Malignant neoplasm of pancreas ([place])    05/17/2018 -  Chemotherapy    The patient had PACLitaxel-protein bound (ABRAXANE) chemo infusion 175 mg, 100 mg/m2 = 175 mg (100 % of original dose 100 mg/m2), Intravenous,  Once, 2 of 5 cycles Dose modification: 100 mg/m2 (original dose 100 mg/m2, Cycle 2) Administration: 175 mg (07/05/2018), 175 mg (07/19/2018), 175 mg (07/26/2018) gemcitabine (GEMZAR) 1,400 mg in sodium chloride 0.9 % 250 mL chemo infusion, 1,406 mg (100 % of original dose 800 mg/m2), Intravenous,  Once, 3 of 6 cycles Dose modification: 800 mg/m2 (original dose 800 mg/m2, Cycle 1, Reason: Other (see comments), Comment: abnormal LFTs) Administration: 1,400 mg (05/17/2018), 1,400 mg (05/24/2018), 1,400 mg (06/14/2018), 1,400 mg (06/28/2018), 1,400 mg (07/19/2018), 1,400 mg (07/05/2018), 1,400 mg (07/26/2018)  for chemotherapy treatment.      ALLERGIES:  has No Known Allergies.  MEDICATIONS:  Current Outpatient Medications  Medication Sig Dispense Refill  . Fluticasone-Salmeterol (ADVAIR) 250-50 MCG/DOSE AEPB Inhale 1 puff into the lungs 2 (two) times daily.     . insulin aspart (NOVOLOG) 100 UNIT/ML injection 5 units subcutaneous prior to meals if sugars greater than 150 10 mL 11  . LANTUS SOLOSTAR 100 UNIT/ML Solostar Pen 6 units suncutanous injection daily 15 mL 12  . lidocaine-prilocaine (EMLA) cream Apply to affected area  once 30 g 3  . LORazepam (ATIVAN) 1 MG tablet Take 1 tablet (1 mg total) by mouth every 8 (eight) hours as needed for anxiety or sleep. 30 tablet 0  . potassium chloride SA (K-DUR,KLOR-CON) 20 MEQ tablet Take 1 tablet (20 mEq total) by mouth daily. 30 tablet 0  .  prochlorperazine (COMPAZINE) 10 MG tablet TAKE 1 TABLET(10 MG) BY MOUTH EVERY 6 HOURS AS NEEDED FOR NAUSEA OR VOMITING 30 tablet 1  . vitamin B-12 (CYANOCOBALAMIN) 1000 MCG tablet Take 1,000 mcg by mouth daily.    Marland Kitchen docusate sodium (COLACE) 100 MG capsule Take 1 capsule (100 mg total) by mouth daily as needed for mild constipation. (Patient not taking: Reported on 05/09/2018) 60 capsule 0  . Melatonin 5 MG TABS Take 1 tablet (5 mg total) by mouth at bedtime. (Patient not taking: Reported on 05/09/2018) 30 tablet 0  . ondansetron (ZOFRAN) 8 MG tablet Take 1 tablet (8 mg total) by mouth 2 (two) times daily as needed (Nausea or vomiting). (Patient not taking: Reported on 05/17/2018) 30 tablet 1  . potassium chloride 20 MEQ/15ML (10%) SOLN TAKE 15 MLS BY MOUTH EVERY DAY FOR 4 DAYS 60 mL 0  . traZODone (DESYREL) 100 MG tablet Take 1 tablet (100 mg total) by mouth at bedtime. (Patient not taking: Reported on 07/30/2018) 30 tablet 0   No current facility-administered medications for this visit.    Facility-Administered Medications Ordered in Other Visits  Medication Dose Route Frequency Provider Last Rate Last Dose  . sodium chloride flush (NS) 0.9 % injection 10 mL  10 mL Intracatheter PRN Sindy Guadeloupe, MD      . sodium chloride flush (NS) 0.9 % injection 10 mL  10 mL Intravenous PRN Sindy Guadeloupe, MD   10 mL at 06/28/18 1246    VITAL SIGNS: BP (!) 153/95   Pulse 100   Temp 97.8 F (36.6 C) (Tympanic)   Resp 18   Ht 6' (1.829 m)   Wt 124 lb 9.6 oz (56.5 kg)   BMI 16.90 kg/m  Filed Weights   07/30/18 1426  Weight: 124 lb 9.6 oz (56.5 kg)    Estimated body mass index is 16.9 kg/m as calculated from the following:   Height as of this encounter: 6' (1.829 m).   Weight as of this encounter: 124 lb 9.6 oz (56.5 kg).  LABS: CBC:    Component Value Date/Time   WBC 5.5 07/26/2018 0831   HGB 10.5 (L) 07/26/2018 0831   HGB 10.9 (L) 10/27/2012 0427   HCT 31.9 (L) 07/26/2018 0831   HCT 31.9  (L) 10/27/2012 0427   PLT 275 07/26/2018 0831   PLT 191 10/27/2012 0427   MCV 95.5 07/26/2018 0831   MCV 99 10/27/2012 0427   NEUTROABS 3.7 07/26/2018 0831   NEUTROABS 5.1 10/27/2012 0427   LYMPHSABS 1.2 07/26/2018 0831   LYMPHSABS 1.2 10/27/2012 0427   MONOABS 0.5 07/26/2018 0831   MONOABS 1.0 10/27/2012 0427   EOSABS 0.0 07/26/2018 0831   EOSABS 0.1 10/27/2012 0427   BASOSABS 0.1 07/26/2018 0831   BASOSABS 0.0 10/27/2012 0427   Comprehensive Metabolic Panel:    Component Value Date/Time   NA 128 (L) 07/26/2018 0831   NA 131 (L) 10/27/2012 0427   K 3.8 07/26/2018 0831   K 3.1 (L) 10/27/2012 0427   CL 92 (L) 07/26/2018 0831   CL 94 (L) 10/27/2012 0427   CO2 28 07/26/2018 0831   CO2 29 10/27/2012 0427  BUN 12 07/26/2018 0831   BUN 9 10/27/2012 0427   CREATININE 0.48 (L) 07/26/2018 0831   CREATININE 0.74 10/27/2012 0427   GLUCOSE 223 (H) 07/26/2018 0831   GLUCOSE 157 (H) 10/27/2012 0427   CALCIUM 8.7 (L) 07/26/2018 0831   CALCIUM 8.0 (L) 10/27/2012 0427   AST 27 07/26/2018 0831   AST 134 (H) 10/22/2012 0501   ALT 21 07/26/2018 0831   ALT 202 (H) 10/22/2012 0501   ALKPHOS 101 07/26/2018 0831   ALKPHOS 65 10/22/2012 0501   BILITOT 0.6 07/26/2018 0831   BILITOT 1.5 (H) 10/22/2012 0501   PROT 6.6 07/26/2018 0831   PROT 6.7 10/22/2012 0501   ALBUMIN 3.1 (L) 07/26/2018 0831   ALBUMIN 3.5 10/22/2012 0501    RADIOGRAPHIC STUDIES: Dg Chest 2 View  Result Date: 07/19/2018 CLINICAL DATA:  Cough, congestion, wheezing EXAM: CHEST - 2 VIEW COMPARISON:  06/06/2018 FINDINGS: Pain right Port-A-Cath remains in place, unchanged. There is hyperinflation of the lungs compatible with COPD. Right pulmonary nodule again noted, stable. No confluent airspace opacities or effusions. No acute bony abnormality. IMPRESSION: COPD.  Stable right mid lung pulmonary nodule.  No active disease. Electronically Signed   By: Rolm Baptise M.D.   On: 07/19/2018 13:15    PERFORMANCE STATUS (ECOG) : 2 -  Symptomatic, <50% confined to bed  Review of Systems As noted above. Otherwise, a complete review of systems is negative.  Physical Exam General: NAD, frail appearing, thin, in chair Cardiovascular: regular rate and rhythm Pulmonary: clear ant fields Abdomen: soft, nontender, + bowel sounds GU: no suprapubic tenderness Extremities: trace pedal edema Skin: no rashes Neurological: Weakness but otherwise nonfocal  IMPRESSION: I met with patient and stepdaughter in the clinic today. Patient was an add on due to insomnia.   We discussed his insomnia at length. He says he does not have any problems initiating sleep but finds it difficult to stay asleep more than a few hours.  However, his report frequently conflicted with history relayed by his stepdaughter.  It appears that patient does not have a consistent sleep pattern.  Some days he goes to bed at 7 PM and wakes by 11 PM and then other nights he will go to bed at 11 PM and wake sometime in the middle of the night.  He sleeps in a bed and there is a TV on until midnight or 1 AM.  He also has to wake frequently during the night to urinate.  Daughter says that when he wakes he often will go and smoke and then eat food and drink large amounts of milk.  Reportedly, patient has been relatively noncompliant with his insulin.  Patient admits to checking and sometimes once a day and other x4 times a day.  He says that his blood sugars are sometimes running in excess of 400.  I encouraged him to make an appointment ASAP with Dr. Ouida Sills, his PCP.  Patient is on Lantus and has sliding scale NovoLog ordered but likely would benefit from adjustment.  I suspect that his poorly controlled diabetes might actually be contributing to his insomnia due to polyuria and nocturnal eating. Additionally, I think that his smoking is likely contributing to disruption in his sleep-wake cycle.   Patient was counseled at length today about appropriate sleep hygeine. He  was instructed to avoid smoking within two hours of bedtime (quitting altogether would be better), avoiding use of TV during bedtime hours, avoiding daytime naps, and adhering to a sleep pattern.  Patient says he recently doubled his dose of lorazepam to 2 mg at bedtime, which she has been taking the past couple of nights.  I explained that I felt that 2 mg of lorazepam was too strong a dose, particularly given his comorbidities such as advanced age and COPD.  Additionally, BZDs are not ideal for chronic management of insomnia. We talked about alternative regimens such as initiation of an atypical antipsychotic or melatonin receptor agonist but patient agreed to continue the 84m of lorazepam and try the nonpharmacologic measures discussed above.    Patient denies depression or significant anxiety. We have previously discussed an antidepressant and patient refused. However, starting one in the future could be considered for moods/anxiety. Mirtazapine could be considered, which might help moods and have slight added benefit to insomnia and appetite.   PLAN: Lorazepam 1771mPO q8h prn for anxiety/sleep, #30, no refills Oxycodone [id number] prn for pain (he has at home) Nonpharmacologic strategies for insomnia Patient to follow-up with PCP regarding diabetes management RTC in 2-3 weeks  Patient expressed understanding and was in agreement with this plan. He also understands that He can call clinic at any time with any questions, concerns, or complaints.   Time Total: 30 minutes  Visit consisted of counseling and education dealing with the complex and emotionally intense issues of symptom management and palliative care in the setting of serious and potentially life-threatening illness.Greater than 50%  of this time was spent counseling and coordinating care related to the above assessment and plan.  Signed by: JosAltha HarmhD, DNP, NP-C, [phone] Cell)

## 2018-07-31 NOTE — Telephone Encounter (Signed)
Prescription sent yesterday already

## 2018-07-31 NOTE — Telephone Encounter (Signed)
unspe...   Ref Range & Units 5d ago (07/26/18) 12d ago (07/19/18) 3wk ago (07/05/18) 34mo ago (06/28/18) 49mo ago (06/14/18) 53mo ago (06/07/18) 56mo ago (06/06/18)  Potassium 3.5 - 5.1 mmol/L 3.8  3.8  3.1Low   3.9

## 2018-08-04 ENCOUNTER — Other Ambulatory Visit: Payer: Self-pay

## 2018-08-04 ENCOUNTER — Emergency Department: Payer: Medicare Other

## 2018-08-04 ENCOUNTER — Inpatient Hospital Stay
Admission: EM | Admit: 2018-08-04 | Discharge: 2018-08-07 | DRG: 871 | Disposition: A | Discharge status: Home / Self Care | Payer: Medicare Other | Attending: Internal Medicine | Admitting: Internal Medicine

## 2018-08-04 DIAGNOSIS — Z515 Encounter for palliative care: Secondary | ICD-10-CM | POA: Diagnosis not present

## 2018-08-04 DIAGNOSIS — I129 Hypertensive chronic kidney disease with stage 1 through stage 4 chronic kidney disease, or unspecified chronic kidney disease: Secondary | ICD-10-CM | POA: Diagnosis present

## 2018-08-04 DIAGNOSIS — Z79899 Other long term (current) drug therapy: Secondary | ICD-10-CM

## 2018-08-04 DIAGNOSIS — R4182 Altered mental status, unspecified: Secondary | ICD-10-CM

## 2018-08-04 DIAGNOSIS — A419 Sepsis, unspecified organism: Secondary | ICD-10-CM | POA: Diagnosis not present

## 2018-08-04 DIAGNOSIS — R6520 Severe sepsis without septic shock: Secondary | ICD-10-CM | POA: Diagnosis present

## 2018-08-04 DIAGNOSIS — E1101 Type 2 diabetes mellitus with hyperosmolarity with coma: Secondary | ICD-10-CM

## 2018-08-04 DIAGNOSIS — F17210 Nicotine dependence, cigarettes, uncomplicated: Secondary | ICD-10-CM | POA: Diagnosis present

## 2018-08-04 DIAGNOSIS — R739 Hyperglycemia, unspecified: Secondary | ICD-10-CM | POA: Diagnosis present

## 2018-08-04 DIAGNOSIS — E872 Acidosis: Secondary | ICD-10-CM | POA: Diagnosis present

## 2018-08-04 DIAGNOSIS — G9341 Metabolic encephalopathy: Secondary | ICD-10-CM | POA: Diagnosis present

## 2018-08-04 DIAGNOSIS — Z66 Do not resuscitate: Secondary | ICD-10-CM | POA: Diagnosis present

## 2018-08-04 DIAGNOSIS — E785 Hyperlipidemia, unspecified: Secondary | ICD-10-CM | POA: Diagnosis present

## 2018-08-04 DIAGNOSIS — C259 Malignant neoplasm of pancreas, unspecified: Secondary | ICD-10-CM | POA: Diagnosis present

## 2018-08-04 DIAGNOSIS — J449 Chronic obstructive pulmonary disease, unspecified: Secondary | ICD-10-CM | POA: Diagnosis present

## 2018-08-04 DIAGNOSIS — Z9221 Personal history of antineoplastic chemotherapy: Secondary | ICD-10-CM

## 2018-08-04 DIAGNOSIS — Z808 Family history of malignant neoplasm of other organs or systems: Secondary | ICD-10-CM

## 2018-08-04 DIAGNOSIS — E1169 Type 2 diabetes mellitus with other specified complication: Secondary | ICD-10-CM | POA: Diagnosis present

## 2018-08-04 DIAGNOSIS — Z800 Family history of malignant neoplasm of digestive organs: Secondary | ICD-10-CM

## 2018-08-04 DIAGNOSIS — G9340 Encephalopathy, unspecified: Secondary | ICD-10-CM | POA: Diagnosis present

## 2018-08-04 DIAGNOSIS — Z9114 Patient's other noncompliance with medication regimen: Secondary | ICD-10-CM

## 2018-08-04 DIAGNOSIS — Z794 Long term (current) use of insulin: Secondary | ICD-10-CM

## 2018-08-04 DIAGNOSIS — I10 Essential (primary) hypertension: Secondary | ICD-10-CM | POA: Diagnosis present

## 2018-08-04 DIAGNOSIS — C7800 Secondary malignant neoplasm of unspecified lung: Secondary | ICD-10-CM | POA: Diagnosis present

## 2018-08-04 DIAGNOSIS — C799 Secondary malignant neoplasm of unspecified site: Secondary | ICD-10-CM

## 2018-08-04 DIAGNOSIS — D631 Anemia in chronic kidney disease: Secondary | ICD-10-CM | POA: Diagnosis present

## 2018-08-04 DIAGNOSIS — N181 Chronic kidney disease, stage 1: Secondary | ICD-10-CM | POA: Diagnosis present

## 2018-08-04 DIAGNOSIS — E1165 Type 2 diabetes mellitus with hyperglycemia: Secondary | ICD-10-CM | POA: Diagnosis present

## 2018-08-04 DIAGNOSIS — E1122 Type 2 diabetes mellitus with diabetic chronic kidney disease: Secondary | ICD-10-CM | POA: Diagnosis present

## 2018-08-04 HISTORY — DX: Malignant (primary) neoplasm, unspecified: C80.1

## 2018-08-04 LAB — CBC WITH DIFFERENTIAL/PLATELET
Abs Immature Granulocytes: 0.2 10*3/uL — ABNORMAL HIGH (ref 0.00–0.07)
Basophils Absolute: 0.1 10*3/uL (ref 0.0–0.1)
Basophils Relative: 1 %
EOS ABS: 0 10*3/uL (ref 0.0–0.5)
EOS PCT: 0 %
HCT: 36.8 % — ABNORMAL LOW (ref 39.0–52.0)
Hemoglobin: 12 g/dL — ABNORMAL LOW (ref 13.0–17.0)
Immature Granulocytes: 2 %
Lymphocytes Relative: 19 %
Lymphs Abs: 2.1 10*3/uL (ref 0.7–4.0)
MCH: 31.3 pg (ref 26.0–34.0)
MCHC: 32.6 g/dL (ref 30.0–36.0)
MCV: 96.1 fL (ref 80.0–100.0)
Monocytes Absolute: 2.1 10*3/uL — ABNORMAL HIGH (ref 0.1–1.0)
Monocytes Relative: 19 %
Neutro Abs: 6.4 10*3/uL (ref 1.7–7.7)
Neutrophils Relative %: 59 %
Platelets: 363 10*3/uL (ref 150–400)
RBC: 3.83 MIL/uL — ABNORMAL LOW (ref 4.22–5.81)
RDW: 15.2 % (ref 11.5–15.5)
WBC: 10.8 10*3/uL — ABNORMAL HIGH (ref 4.0–10.5)
nRBC: 0 % (ref 0.0–0.2)

## 2018-08-04 LAB — URINALYSIS, COMPLETE (UACMP) WITH MICROSCOPIC
Bacteria, UA: NONE SEEN
Bilirubin Urine: NEGATIVE
Glucose, UA: >=500 mg/dL — AB
Hgb urine dipstick: NEGATIVE
Ketones, ur: NEGATIVE mg/dL
Leukocytes, UA: NEGATIVE
Nitrite: NEGATIVE
PROTEIN: NEGATIVE mg/dL
Specific Gravity, Urine: 1.022 (ref 1.005–1.030)
pH: 7 (ref 5.0–8.0)

## 2018-08-04 LAB — CG4 I-STAT (LACTIC ACID): Lactic Acid, Venous: 3.46 mmol/L (ref 0.5–1.9)

## 2018-08-04 LAB — BASIC METABOLIC PANEL
Anion gap: 15 (ref 5–15)
BUN: 16 mg/dL (ref 8–23)
CALCIUM: 9 mg/dL (ref 8.9–10.3)
CO2: 27 mmol/L (ref 22–32)
Chloride: 83 mmol/L — ABNORMAL LOW (ref 98–111)
Creatinine, Ser: 0.71 mg/dL (ref 0.61–1.24)
GFR calc Af Amer: >60 mL/min (ref >60)
GFR calc non Af Amer: >60 mL/min (ref >60)
Glucose, Bld: 797 mg/dL (ref 70–99)
Potassium: 4.1 mmol/L (ref 3.5–5.1)
Sodium: 125 mmol/L — ABNORMAL LOW (ref 135–145)

## 2018-08-04 LAB — HEPATIC FUNCTION PANEL
ALT: 24 U/L (ref 0–44)
AST: 25 U/L (ref 15–41)
Albumin: 3.3 g/dL — ABNORMAL LOW (ref 3.5–5.0)
Alkaline Phosphatase: 174 U/L — ABNORMAL HIGH (ref 38–126)
Bilirubin, Direct: 0.2 mg/dL (ref 0.0–0.2)
Indirect Bilirubin: 0.4 mg/dL (ref 0.3–0.9)
Total Bilirubin: 0.6 mg/dL (ref 0.3–1.2)
Total Protein: 7.4 g/dL (ref 6.5–8.1)

## 2018-08-04 LAB — BLOOD GAS, VENOUS
Acid-Base Excess: 5.8 mmol/L — ABNORMAL HIGH (ref 0.0–2.0)
Bicarbonate: 29.8 mmol/L — ABNORMAL HIGH (ref 20.0–28.0)
O2 Saturation: 87.9 %
Patient temperature: 37
pCO2, Ven: 40 mmHg — ABNORMAL LOW (ref 44.0–60.0)
pH, Ven: 7.48 — ABNORMAL HIGH (ref 7.250–7.430)
pO2, Ven: 50 mmHg — ABNORMAL HIGH (ref 32.0–45.0)

## 2018-08-04 LAB — GLUCOSE, CAPILLARY
GLUCOSE-CAPILLARY: 597 mg/dL — AB (ref 70–99)
Glucose-Capillary: >600 mg/dL (ref 70–99)

## 2018-08-04 LAB — ETHANOL: Alcohol, Ethyl (B): <10 mg/dL (ref <10)

## 2018-08-04 LAB — LIPASE, BLOOD: LIPASE: 45 U/L (ref 11–51)

## 2018-08-04 LAB — ACETAMINOPHEN LEVEL

## 2018-08-04 LAB — SALICYLATE LEVEL: Salicylate Lvl: <7 mg/dL (ref 2.8–30.0)

## 2018-08-04 MED ORDER — SODIUM CHLORIDE 0.9 % IV BOLUS
1000.0000 mL | Freq: Once | INTRAVENOUS | Status: AC
  Administered 2018-08-04: 1000 mL via INTRAVENOUS

## 2018-08-04 MED ORDER — LORAZEPAM 2 MG/ML IJ SOLN
INTRAMUSCULAR | Status: AC
  Administered 2018-08-04: 1 mg via INTRAVENOUS
  Filled 2018-08-04: qty 1

## 2018-08-04 MED ORDER — LORAZEPAM 2 MG/ML IJ SOLN
1.0000 mg | Freq: Once | INTRAMUSCULAR | Status: AC
  Administered 2018-08-04: 1 mg via INTRAVENOUS

## 2018-08-04 MED ORDER — LORAZEPAM 2 MG/ML IJ SOLN
INTRAMUSCULAR | Status: AC
  Filled 2018-08-04: qty 1

## 2018-08-04 MED ORDER — HALOPERIDOL LACTATE 5 MG/ML IJ SOLN
5.0000 mg | Freq: Once | INTRAMUSCULAR | Status: AC
  Administered 2018-08-04: 5 mg via INTRAVENOUS

## 2018-08-04 MED ORDER — IOPAMIDOL (ISOVUE-300) INJECTION 61%
100.0000 mL | Freq: Once | INTRAVENOUS | Status: AC | PRN
  Administered 2018-08-04: 100 mL via INTRAVENOUS

## 2018-08-04 MED ORDER — HALOPERIDOL LACTATE 5 MG/ML IJ SOLN
INTRAMUSCULAR | Status: AC
  Filled 2018-08-04: qty 1

## 2018-08-04 MED ORDER — INSULIN ASPART 100 UNIT/ML ~~LOC~~ SOLN
8.0000 [IU] | Freq: Once | SUBCUTANEOUS | Status: AC
  Administered 2018-08-04: 8 [IU] via INTRAVENOUS
  Filled 2018-08-04: qty 1

## 2018-08-04 MED ORDER — HALOPERIDOL LACTATE 5 MG/ML IJ SOLN
5.0000 mg | Freq: Once | INTRAMUSCULAR | Status: DC
  Filled 2018-08-04: qty 1

## 2018-08-04 NOTE — ED Notes (Signed)
Pt is unable to be still or rationalize movement, not following directions

## 2018-08-04 NOTE — ED Notes (Signed)
Bonnie RN to sit with pt.

## 2018-08-04 NOTE — ED Provider Notes (Signed)
[facility] Emergency Department Provider Note   ____________________________________________   First MD Initiated Contact with Patient 08/04/18 2016     (approximate)  I have reviewed the triage vital signs and the nursing notes.   HISTORY  Chief Complaint Hyperglycemia and Altered Mental Status Chief complaint is altered mental status and high blood sugar history limited by altered mental status HPI Bryan Hicks is a 78 y.o. 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  Patient getting palliative care.  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  He had not episode of altered mental status felt likely to hypoglycemia.  Had a fever of unknown origin. Family comes in and reports that he is not been taking his blood sugars regularly he is not been taking his blood sugar medicine regularly either.  Today he seemed to get very confused at 630 or 7:00.  Sounded like he fell also.  He has not been confused up until just them.      Past Medical History:  Diagnosis Date  . Cancer ([place])   . COPD (chronic obstructive pulmonary disease) ([place])   . Diabetes mellitus without complication ([place])   . Hypertension   . Shortness of breath dyspnea     Patient Active Problem List   Diagnosis Date Noted  . Hyperglycemia 08/05/2018  . Acute encephalopathy 08/05/2018  . Sepsis ([place]) 08/05/2018  . Malignant neoplasm of pancreas ([place]) 05/13/2018  . Hypoglycemia 05/03/2018  . Pancreatic mass 04/26/2018  . Protein-calorie malnutrition, severe 04/26/2018  . Neoplasm of digestive system   . Obstruction of bile duct   . Other specified diseases of biliary tract   . Mild protein-calorie malnutrition ([place]) 01/08/2018  . Pernicious anemia 10/07/2014  . COPD (chronic obstructive pulmonary disease) ([place]) 12/27/2013  . HTN (hypertension), benign 12/27/2013  . Hyperlipidemia associated with type 2 diabetes mellitus ([place])  12/27/2013  . Type 2 diabetes mellitus with stage 1 chronic kidney disease, without long-term current use of insulin ([place]) 12/27/2013    Past Surgical History:  Procedure Laterality Date  . COLON SURGERY     "twisted intestine"  . ERCP N/A 04/26/2018   Procedure: ENDOSCOPIC RETROGRADE CHOLANGIOPANCREATOGRAPHY (ERCP);  Surgeon: Lucilla Lame, MD;  Location: [facility] ENDOSCOPY;  Service: Endoscopy;  Laterality: N/A;  . HERNIA MESH REMOVAL Right   . INGUINAL HERNIA REPAIR Left 12/04/2014   Procedure: HERNIA REPAIR INGUINAL ADULT;  Surgeon: Rochel Brome, MD;  Location: [facility] ORS;  Service: General;  Laterality: Left;  . PORTA CATH INSERTION N/A 05/16/2018   Procedure: PORTA CATH INSERTION;  Surgeon: Algernon Huxley, MD;  Location: [facility];  Service: Cardiovascular;  Laterality: N/A;    Prior to Admission medications   Medication Sig Start Date End Date Taking? Authorizing Provider  docusate sodium (COLACE) 100 MG capsule Take 1 capsule (100 mg total) by mouth daily as needed for mild constipation. Patient not taking: Reported on 05/09/2018 05/05/18   Loletha Grayer, MD  Fluticasone-Salmeterol (ADVAIR) 250-50 MCG/DOSE AEPB Inhale 1 puff into the lungs 2 (two) times daily.     [provider]  insulin aspart (NOVOLOG) 100 UNIT/ML injection 5 units subcutaneous prior to meals if sugars greater than 150 05/05/18   Wieting, Richard, MD  LANTUS SOLOSTAR 100 UNIT/ML Solostar Pen 6 units suncutanous injection daily 05/05/18   Loletha Grayer, MD  lidocaine-prilocaine (EMLA) cream Apply to affected area once 05/13/18   Sindy Guadeloupe, MD  LORazepam (  ATIVAN) 1 MG tablet Take 1 tablet (1 mg total) by mouth every 8 (eight) hours as needed for anxiety or sleep. 07/30/18   Borders, Kirt Boys, NP  Melatonin 5 MG TABS Take 1 tablet (5 mg total) by mouth at bedtime. Patient not taking: Reported on 05/09/2018 05/05/18   Loletha Grayer, MD  ondansetron (ZOFRAN) 8 MG tablet Take 1 tablet (8 mg  total) by mouth 2 (two) times daily as needed (Nausea or vomiting). Patient not taking: Reported on 05/17/2018 05/13/18   Sindy Guadeloupe, MD  potassium chloride 20 MEQ/15ML (10%) SOLN TAKE 15 MLS BY MOUTH EVERY DAY FOR 4 DAYS 06/03/18   Sindy Guadeloupe, MD  potassium chloride SA (K-DUR,KLOR-CON) 20 MEQ tablet Take 1 tablet (20 mEq total) by mouth daily. 07/30/18   Sindy Guadeloupe, MD  prochlorperazine (COMPAZINE) 10 MG tablet TAKE 1 TABLET(10 MG) BY MOUTH EVERY 6 HOURS AS NEEDED FOR NAUSEA OR VOMITING 07/11/18   Sindy Guadeloupe, MD  traZODone (DESYREL) 100 MG tablet Take 1 tablet (100 mg total) by mouth at bedtime. Patient not taking: Reported on 07/30/2018 05/05/18   Loletha Grayer, MD  vitamin B-12 (CYANOCOBALAMIN) 1000 MCG tablet Take 1,000 mcg by mouth daily.    [provider]    Allergies Patient has no known allergies.  Family History  Problem Relation Age of Onset  . Colon cancer Mother   . Melanoma Father     Social History Social History   Tobacco Use  . Smoking status: Current Every Day Smoker    Packs/day: 1.00    Years: 30.00    Pack years: 30.00  . Smokeless tobacco: Former Network engineer Use Topics  . Alcohol use: No  . Drug use: No    Review of Systems  Unable to obtain ____________________________________________   PHYSICAL EXAM:  VITAL SIGNS: ED Triage Vitals  Enc Vitals Group     BP      Pulse      Resp      Temp      Temp src      SpO2      Weight      Height      Head Circumference      Peak Flow      Pain Score      Pain Loc      Pain Edu?      Excl. in [place]?     Constitutional: Alert alert but confused does not appear to be in distress Eyes: Conjunctivae are normal. Head: Atraumatic. Nose: No congestion/rhinnorhea. Mouth/Throat: Mucous membranes are moist.  Oropharynx non-erythematous. Neck: No stridor. Cardiovascular: Normal rate, regular rhythm. Grossly normal heart sounds.  Good peripheral circulation. Respiratory: Normal  respiratory effort.  No retractions. Lungs CTAB. Gastrointestinal: Soft and nontender. No distention. No abdominal bruits. No CVA tenderness. Musculoskeletal: No lower extremity tenderness nor edema.   Neurologic:No gross focal neurologic deficits are appreciated.  Patient confused really does not follow commands he is moving all extremities equally and well speech is clear but really does not make sense Skin:  Skin is warm, dry and intact. No rash noted.   ____________________________________________   LABS (all labs ordered are listed, but only abnormal results are displayed)  Labs Reviewed  BASIC METABOLIC PANEL - Abnormal; Notable for the following components:      Result Value   Sodium 125 (*)    Chloride 83 (*)    Glucose, Bld 797 (*)    All other  components within normal limits  HEPATIC FUNCTION PANEL - Abnormal; Notable for the following components:   Albumin 3.3 (*)    Alkaline Phosphatase 174 (*)    All other components within normal limits  ACETAMINOPHEN LEVEL - Abnormal; Notable for the following components:   Acetaminophen (Tylenol), Serum <10 (*)    All other components within normal limits  CBC WITH DIFFERENTIAL/PLATELET - Abnormal; Notable for the following components:   WBC 10.8 (*)    RBC 3.83 (*)    Hemoglobin 12.0 (*)    HCT 36.8 (*)    Monocytes Absolute 2.1 (*)    Abs Immature Granulocytes 0.20 (*)    All other components within normal limits  BLOOD GAS, VENOUS - Abnormal; Notable for the following components:   pH, Ven 7.48 (*)    pCO2, Ven 40 (*)    pO2, Ven 50.0 (*)    Bicarbonate 29.8 (*)    Acid-Base Excess 5.8 (*)    All other components within normal limits  GLUCOSE, CAPILLARY - Abnormal; Notable for the following components:   Glucose-Capillary >600 (*)    All other components within normal limits  URINALYSIS, COMPLETE (UACMP) WITH MICROSCOPIC - Abnormal; Notable for the following components:   Color, Urine COLORLESS (*)    APPearance CLEAR  (*)    Glucose, UA >=500 (*)    All other components within normal limits  GLUCOSE, CAPILLARY - Abnormal; Notable for the following components:   Glucose-Capillary 597 (*)    All other components within normal limits  BASIC METABOLIC PANEL - Abnormal; Notable for the following components:   Sodium 129 (*)    Chloride 91 (*)    Glucose, Bld 464 (*)    Creatinine, Ser 0.50 (*)    Calcium 8.5 (*)    All other components within normal limits  LACTIC ACID, PLASMA - Abnormal; Notable for the following components:   Lactic Acid, Venous 2.5 (*)    All other components within normal limits  LACTIC ACID, PLASMA - Abnormal; Notable for the following components:   Lactic Acid, Venous 2.9 (*)    All other components within normal limits  BASIC METABOLIC PANEL - Abnormal; Notable for the following components:   Sodium 131 (*)    Potassium 3.0 (*)    Chloride 93 (*)    Glucose, Bld 328 (*)    Creatinine, Ser 0.48 (*)    Calcium 8.3 (*)    All other components within normal limits  CBC - Abnormal; Notable for the following components:   RBC 3.18 (*)    Hemoglobin 9.9 (*)    HCT 30.3 (*)    All other components within normal limits  GLUCOSE, CAPILLARY - Abnormal; Notable for the following components:   Glucose-Capillary 477 (*)    All other components within normal limits  GLUCOSE, CAPILLARY - Abnormal; Notable for the following components:   Glucose-Capillary 417 (*)    All other components within normal limits  GLUCOSE, CAPILLARY - Abnormal; Notable for the following components:   Glucose-Capillary 365 (*)    All other components within normal limits  GLUCOSE, CAPILLARY - Abnormal; Notable for the following components:   Glucose-Capillary 166 (*)    All other components within normal limits  GLUCOSE, CAPILLARY - Abnormal; Notable for the following components:   Glucose-Capillary 104 (*)    All other components within normal limits  CG4 I-STAT (LACTIC ACID) - Abnormal; Notable for the  following components:   Lactic Acid, Venous 3.46 (*)  All other components within normal limits  CULTURE, BLOOD (ROUTINE X 2)  CULTURE, BLOOD (ROUTINE X 2)  ETHANOL  SALICYLATE LEVEL  LIPASE, BLOOD  INFLUENZA PANEL BY PCR (TYPE A & B)  CBG MONITORING, ED  CBG MONITORING, ED   ____________________________________________  EKG  EKG read interpreted by me shows sinus tach at 124 with frequent PVCs no obvious acute ST-T wave changes but the baseline is very irregular and is difficult to tell ____________________________________________  RADIOLOGY  ED MD interpretation:   Official radiology report(s): Ct Head Wo Contrast  Result Date: 08/04/2018 CLINICAL DATA:  Altered mental status. Elevated glucose. EXAM: CT HEAD WITHOUT CONTRAST TECHNIQUE: Contiguous axial images were obtained from the base of the skull through the vertex without intravenous contrast. COMPARISON:  MRI brain 06/06/2018. CT head 06/06/2018. FINDINGS: Brain: Diffuse cerebral atrophy. Low-attenuation changes in the deep white matter consistent small vessel ischemia. No mass-effect or midline shift. No abnormal extra-axial fluid collections. Gray-white matter junctions are distinct. Basal cisterns are not effaced. No acute intracranial hemorrhage. No significant changes since previous study. Vascular: Mild intracranial arterial vascular calcifications. Skull: Calvarium appears intact. Sinuses/Orbits: Mucosal thickening in the paranasal sinuses. No acute air-fluid levels. Mastoid air cells are clear. Other: Mild nasal bone deformities, probably old. IMPRESSION: No acute intracranial abnormalities. Chronic atrophy and small vessel ischemic changes. Electronically Signed   By: Lucienne Capers M.D.   On: 08/04/2018 23:42   Ct Abdomen Pelvis W Contrast  Result Date: 08/04/2018 CLINICAL DATA:  Abdominal pain, hyperglycemia. Stage IV pancreatic adenocarcinoma. EXAM: CT ABDOMEN AND PELVIS WITH CONTRAST TECHNIQUE: Multidetector CT  imaging of the abdomen and pelvis was performed using the standard protocol following bolus administration of intravenous contrast. CONTRAST:  175mL ISOVUE-300 IOPAMIDOL (ISOVUE-300) INJECTION 61% COMPARISON:  04/25/2018 FINDINGS: Motion degraded images. Lower chest: Lung bases are clear. Hepatobiliary: No focal hepatic lesions. Gallbladder is unremarkable. Moderate intrahepatic and extrahepatic ductal dilatation. Indwelling common duct stent. Pancreas: 3.2 x 4.0 cm mass in the pancreatic head (series 2/image 34), increased, corresponding to the patient's known pancreatic adenocarcinoma. Associated atrophy of the pancreatic body/tail with associated ductal dilatation (series 2/image 28). Suspected adjacent necrotic peripancreatic adenopathy, measuring up to 1.4 cm (series 2/image 28), poorly evaluated. Spleen: Within normal limits. Adrenals/Urinary Tract: 1.7 cm left adrenal nodule (series 2/image 25), previously 2.7 cm. Right adrenal gland is within normal limits. Subcentimeter renal cysts bilaterally.  No hydronephrosis. Bladder is mildly thick-walled although underdistended. Stomach/Bowel: Stomach is within normal limits. No evidence of bowel obstruction. Appendix is not discretely visualized. Mild to moderate rectal stool burden with mild fecal impaction. Vascular/Lymphatic: No evidence of abdominal aortic aneurysm. Atherosclerotic calcifications of the abdominal aorta and branch vessels. Small para-aortic nodes, including an 8 mm short axis left para-aortic node (series 2/image 33), mildly increased. Reproductive: Prostate is grossly unremarkable. Other: No abdominopelvic ascites. No frank peritoneal nodularity. Prior right lateral ventral hernia mesh repair. Musculoskeletal: Grade 1 spondylolisthesis at L5-S1. Very mild degenerative changes of the lower thoracic spine. IMPRESSION: 4.0 cm mass in the pancreatic head, corresponding to the patient's known pancreatic adenocarcinoma, mildly increased. Suspected 1.4  cm short axis necrotic peripancreatic node. Small para-aortic nodes measuring up to 8 mm short axis. 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 Moderate intrahepatic and extrahepatic ductal dilatation. Indwelling common duct stent. Electronically Signed   By: Julian Hy M.D.   On: 08/04/2018 23:50   Dg Chest Port 1 View  Result Date: 08/04/2018 CLINICAL DATA:  Altered mental status EXAM: PORTABLE CHEST 1 VIEW  COMPARISON:  07/19/2018 FINDINGS: Power port type central venous catheter with tip over the low SVC region. No pneumothorax. Probable emphysematous changes in the lungs. No focal consolidation. 1 cm nodular opacity in the right mid lung is unchanged since previous study. Prominence of the right hilum could represent lymphadenopathy. This is increased. Left lung is clear. No blunting of costophrenic angles. Calcification of the aorta. Thoracic scoliosis convex towards the right. IMPRESSION: 1. Emphysematous changes in the lungs. No evidence of active pulmonary disease. 2. Stable 1 cm nodular opacity in the right mid lung. 3. Increasing prominence of right hilum may indicate lymphadenopathy. Electronically Signed   By: Lucienne Capers M.D.   On: 08/04/2018 22:30    ____________________________________________   PROCEDURES  Procedure(s) performed:   Procedures  Critical Care performed: Medical care time 45 minutes.  This includes supervising the administration of his Ativan to sedate him for CT scan.  Also discussion of him with the hospitalist and review of his old medical records and speaking with his family.  ____________________________________________   INITIAL IMPRESSION / ASSESSMENT AND PLAN / ED COURSE  Patient with hyperglycemic hyperosmolar nonketotic coma.  He is very confused.  CT does not show any acute pathology.  Sugar is coming down with fluids and insulin.  We will get him in the hospital to complete his treatment.          ____________________________________________   FINAL CLINICAL IMPRESSION(S) / ED DIAGNOSES  Final diagnoses:  HHNC (hyperglycemic hyperosmolar nonketotic coma) ([place])  Encephalopathy  Metastatic cancer [facility])     ED Discharge Orders    None       Note:  This document was prepared using Dragon voice recognition software and may include unintentional dictation errors.    Nena Polio, MD 08/05/18 1540

## 2018-08-04 NOTE — ED Notes (Signed)
Pt returned from [place] with this RN and Maralyn Sago.

## 2018-08-04 NOTE — ED Notes (Addendum)
Pt still agitated, diaper and bedding changed  No swallow reflex noted

## 2018-08-04 NOTE — ED Notes (Signed)
Pt remains very agitated and frigidity, Dr. Cinda Quest informed. Informed CT and xray that scans can't be performed at this time.

## 2018-08-04 NOTE — ED Notes (Signed)
Date and time results received: 08/04/18 2135  Test: glucose Critical Value: 797  Name of Provider Notified: Dr. Cinda Quest

## 2018-08-04 NOTE — ED Notes (Signed)
Family at bedside. 

## 2018-08-04 NOTE — ED Notes (Signed)
Patient transported to CT 

## 2018-08-04 NOTE — ED Notes (Signed)
Pt arrives [facility] from home for AMS and hyperglycemia. Pt fell today, unsure of LOC. Family states that pt hasn't been taking his insulin x few days. Pt was very agitated with EMS, unable to get an IV. Upon arrival pt does NOT follow commands, does not talk much. Eyes reactive to light. EMS states CBG read HIGH. Hx pancreatic CA, possibly lung CA as well. Pt coughing. Only words pt speaks are to request water. When give pt water pt doesn't drink it. Feels hot to touch. Squirming around in bed, unable to keep pt still. Multiple staff members having to hold pt on stretcher d/t trying to sit up and get off stretcher.

## 2018-08-05 ENCOUNTER — Telehealth: Payer: Self-pay | Admitting: *Deleted

## 2018-08-05 ENCOUNTER — Other Ambulatory Visit: Payer: Self-pay

## 2018-08-05 DIAGNOSIS — G9340 Encephalopathy, unspecified: Secondary | ICD-10-CM | POA: Diagnosis not present

## 2018-08-05 DIAGNOSIS — Z79899 Other long term (current) drug therapy: Secondary | ICD-10-CM | POA: Diagnosis not present

## 2018-08-05 DIAGNOSIS — Z515 Encounter for palliative care: Secondary | ICD-10-CM

## 2018-08-05 DIAGNOSIS — N181 Chronic kidney disease, stage 1: Secondary | ICD-10-CM | POA: Diagnosis present

## 2018-08-05 DIAGNOSIS — Z794 Long term (current) use of insulin: Secondary | ICD-10-CM | POA: Diagnosis not present

## 2018-08-05 DIAGNOSIS — E1165 Type 2 diabetes mellitus with hyperglycemia: Secondary | ICD-10-CM | POA: Diagnosis present

## 2018-08-05 DIAGNOSIS — I129 Hypertensive chronic kidney disease with stage 1 through stage 4 chronic kidney disease, or unspecified chronic kidney disease: Secondary | ICD-10-CM | POA: Diagnosis present

## 2018-08-05 DIAGNOSIS — R739 Hyperglycemia, unspecified: Secondary | ICD-10-CM | POA: Diagnosis present

## 2018-08-05 DIAGNOSIS — J449 Chronic obstructive pulmonary disease, unspecified: Secondary | ICD-10-CM | POA: Diagnosis present

## 2018-08-05 DIAGNOSIS — E1122 Type 2 diabetes mellitus with diabetic chronic kidney disease: Secondary | ICD-10-CM | POA: Diagnosis present

## 2018-08-05 DIAGNOSIS — C7800 Secondary malignant neoplasm of unspecified lung: Secondary | ICD-10-CM | POA: Diagnosis present

## 2018-08-05 DIAGNOSIS — Z808 Family history of malignant neoplasm of other organs or systems: Secondary | ICD-10-CM | POA: Diagnosis not present

## 2018-08-05 DIAGNOSIS — Z9114 Patient's other noncompliance with medication regimen: Secondary | ICD-10-CM | POA: Diagnosis not present

## 2018-08-05 DIAGNOSIS — C259 Malignant neoplasm of pancreas, unspecified: Secondary | ICD-10-CM | POA: Diagnosis present

## 2018-08-05 DIAGNOSIS — A419 Sepsis, unspecified organism: Secondary | ICD-10-CM | POA: Diagnosis present

## 2018-08-05 DIAGNOSIS — E785 Hyperlipidemia, unspecified: Secondary | ICD-10-CM | POA: Diagnosis present

## 2018-08-05 DIAGNOSIS — G9341 Metabolic encephalopathy: Secondary | ICD-10-CM | POA: Diagnosis present

## 2018-08-05 DIAGNOSIS — Z800 Family history of malignant neoplasm of digestive organs: Secondary | ICD-10-CM | POA: Diagnosis not present

## 2018-08-05 DIAGNOSIS — Z66 Do not resuscitate: Secondary | ICD-10-CM | POA: Diagnosis present

## 2018-08-05 DIAGNOSIS — R6520 Severe sepsis without septic shock: Secondary | ICD-10-CM | POA: Diagnosis present

## 2018-08-05 DIAGNOSIS — F17210 Nicotine dependence, cigarettes, uncomplicated: Secondary | ICD-10-CM | POA: Diagnosis present

## 2018-08-05 DIAGNOSIS — D631 Anemia in chronic kidney disease: Secondary | ICD-10-CM | POA: Diagnosis present

## 2018-08-05 DIAGNOSIS — E872 Acidosis: Secondary | ICD-10-CM | POA: Diagnosis present

## 2018-08-05 DIAGNOSIS — Z9221 Personal history of antineoplastic chemotherapy: Secondary | ICD-10-CM | POA: Diagnosis not present

## 2018-08-05 DIAGNOSIS — R4182 Altered mental status, unspecified: Secondary | ICD-10-CM | POA: Diagnosis present

## 2018-08-05 LAB — BASIC METABOLIC PANEL
Anion gap: 9 (ref 5–15)
Anion gap: 9 (ref 5–15)
BUN: 13 mg/dL (ref 8–23)
BUN: 10 mg/dL (ref 8–23)
CO2: 29 mmol/L (ref 22–32)
CO2: 29 mmol/L (ref 22–32)
Calcium: 8.5 mg/dL — ABNORMAL LOW (ref 8.9–10.3)
Calcium: 8.3 mg/dL — ABNORMAL LOW (ref 8.9–10.3)
Chloride: 91 mmol/L — ABNORMAL LOW (ref 98–111)
Chloride: 93 mmol/L — ABNORMAL LOW (ref 98–111)
Creatinine, Ser: 0.5 mg/dL — ABNORMAL LOW (ref 0.61–1.24)
Creatinine, Ser: 0.48 mg/dL — ABNORMAL LOW (ref 0.61–1.24)
GFR calc Af Amer: >60 mL/min (ref >60)
GFR calc Af Amer: >60 mL/min (ref >60)
Glucose, Bld: 464 mg/dL — ABNORMAL HIGH (ref 70–99)
Glucose, Bld: 328 mg/dL — ABNORMAL HIGH (ref 70–99)
POTASSIUM: 3 mmol/L — AB (ref 3.5–5.1)
Potassium: 3.5 mmol/L (ref 3.5–5.1)
Sodium: 129 mmol/L — ABNORMAL LOW (ref 135–145)
Sodium: 131 mmol/L — ABNORMAL LOW (ref 135–145)

## 2018-08-05 LAB — CBC
HCT: 30.3 % — ABNORMAL LOW (ref 39.0–52.0)
HEMOGLOBIN: 9.9 g/dL — AB (ref 13.0–17.0)
MCH: 31.1 pg (ref 26.0–34.0)
MCHC: 32.7 g/dL (ref 30.0–36.0)
MCV: 95.3 fL (ref 80.0–100.0)
Platelets: 327 10*3/uL (ref 150–400)
RBC: 3.18 MIL/uL — ABNORMAL LOW (ref 4.22–5.81)
RDW: 14.7 % (ref 11.5–15.5)
WBC: 9.8 10*3/uL (ref 4.0–10.5)
nRBC: 0 % (ref 0.0–0.2)

## 2018-08-05 LAB — GLUCOSE, CAPILLARY
GLUCOSE-CAPILLARY: 477 mg/dL — AB (ref 70–99)
Glucose-Capillary: 417 mg/dL — ABNORMAL HIGH (ref 70–99)
Glucose-Capillary: 365 mg/dL — ABNORMAL HIGH (ref 70–99)
Glucose-Capillary: 166 mg/dL — ABNORMAL HIGH (ref 70–99)
Glucose-Capillary: 104 mg/dL — ABNORMAL HIGH (ref 70–99)

## 2018-08-05 LAB — LACTIC ACID, PLASMA
Lactic Acid, Venous: 2.5 mmol/L (ref 0.5–1.9)
Lactic Acid, Venous: 2.9 mmol/L (ref 0.5–1.9)

## 2018-08-05 LAB — INFLUENZA PANEL BY PCR (TYPE A & B)
Influenza A By PCR: NEGATIVE
Influenza B By PCR: NEGATIVE

## 2018-08-05 MED ORDER — GLYCOPYRROLATE 1 MG PO TABS
1.0000 mg | ORAL_TABLET | ORAL | Status: DC | PRN
  Filled 2018-08-05: qty 1

## 2018-08-05 MED ORDER — LORAZEPAM 2 MG/ML IJ SOLN
1.0000 mg | Freq: Three times a day (TID) | INTRAMUSCULAR | Status: DC | PRN
  Administered 2018-08-05: 1 mg via INTRAVENOUS
  Filled 2018-08-05: qty 1

## 2018-08-05 MED ORDER — VANCOMYCIN HCL IN DEXTROSE 750-5 MG/150ML-% IV SOLN
750.0000 mg | Freq: Two times a day (BID) | INTRAVENOUS | Status: DC
  Filled 2018-08-05: qty 150

## 2018-08-05 MED ORDER — SODIUM CHLORIDE 0.9 % IV BOLUS
1000.0000 mL | Freq: Once | INTRAVENOUS | Status: AC
  Administered 2018-08-05: 1000 mL via INTRAVENOUS

## 2018-08-05 MED ORDER — SODIUM CHLORIDE 0.9 % IV SOLN
INTRAVENOUS | Status: DC
  Administered 2018-08-05: 04:00:00 via INTRAVENOUS

## 2018-08-05 MED ORDER — LORAZEPAM 2 MG/ML IJ SOLN: 1.0000 mg | INTRAMUSCULAR | Status: DC | PRN

## 2018-08-05 MED ORDER — VANCOMYCIN HCL 10 G IV SOLR
1500.0000 mg | Freq: Once | INTRAVENOUS | Status: DC
  Administered 2018-08-05: 11:00:00 1500 mg via INTRAVENOUS
  Filled 2018-08-05: qty 1500

## 2018-08-05 MED ORDER — LORAZEPAM 1 MG PO TABS: 1.0000 mg | ORAL_TABLET | Freq: Three times a day (TID) | ORAL | Status: DC | PRN

## 2018-08-05 MED ORDER — ACETAMINOPHEN 650 MG RE SUPP
650.0000 mg | Freq: Four times a day (QID) | RECTAL | Status: DC | PRN
  Administered 2018-08-05: 650 mg via RECTAL
  Filled 2018-08-05: qty 1

## 2018-08-05 MED ORDER — LORAZEPAM 1 MG PO TABS: 1.0000 mg | ORAL_TABLET | ORAL | Status: DC | PRN

## 2018-08-05 MED ORDER — ONDANSETRON HCL 4 MG/2ML IJ SOLN: 4.0000 mg | Freq: Four times a day (QID) | INTRAMUSCULAR | Status: DC | PRN

## 2018-08-05 MED ORDER — INSULIN GLARGINE 100 UNIT/ML ~~LOC~~ SOLN
6.0000 [IU] | Freq: Every day | SUBCUTANEOUS | Status: DC
  Filled 2018-08-05: qty 0.06

## 2018-08-05 MED ORDER — INSULIN ASPART 100 UNIT/ML ~~LOC~~ SOLN
12.0000 [IU] | Freq: Once | SUBCUTANEOUS | Status: AC
  Administered 2018-08-05: 04:00:00 12 [IU] via SUBCUTANEOUS
  Filled 2018-08-05: qty 1

## 2018-08-05 MED ORDER — ORAL CARE MOUTH RINSE
15.0000 mL | Freq: Two times a day (BID) | OROMUCOSAL | Status: DC
  Administered 2018-08-05 (×2): 15 mL via OROMUCOSAL

## 2018-08-05 MED ORDER — HALOPERIDOL LACTATE 2 MG/ML PO CONC
0.5000 mg | ORAL | Status: DC | PRN
  Filled 2018-08-05: qty 0.3

## 2018-08-05 MED ORDER — GLYCOPYRROLATE 0.2 MG/ML IJ SOLN
0.2000 mg | INTRAMUSCULAR | Status: DC | PRN
  Filled 2018-08-05: qty 1

## 2018-08-05 MED ORDER — ACETAMINOPHEN 325 MG PO TABS: 650.0000 mg | ORAL_TABLET | Freq: Four times a day (QID) | ORAL | Status: DC | PRN

## 2018-08-05 MED ORDER — SODIUM CHLORIDE 0.9 % IV SOLN
INTRAVENOUS | Status: DC
  Administered 2018-08-05: 10:00:00 via INTRAVENOUS

## 2018-08-05 MED ORDER — ONDANSETRON HCL 4 MG PO TABS: 4.0000 mg | ORAL_TABLET | Freq: Four times a day (QID) | ORAL | Status: DC | PRN

## 2018-08-05 MED ORDER — MORPHINE SULFATE (CONCENTRATE) 10 MG/0.5ML PO SOLN: 5.0000 mg | ORAL | Status: DC | PRN

## 2018-08-05 MED ORDER — MOMETASONE FURO-FORMOTEROL FUM 200-5 MCG/ACT IN AERO
2.0000 | INHALATION_SPRAY | Freq: Two times a day (BID) | RESPIRATORY_TRACT | Status: DC
  Filled 2018-08-05: qty 8.8

## 2018-08-05 MED ORDER — INSULIN ASPART 100 UNIT/ML ~~LOC~~ SOLN
0.0000 [IU] | Freq: Three times a day (TID) | SUBCUTANEOUS | Status: DC
  Administered 2018-08-05: 2 [IU] via SUBCUTANEOUS
  Filled 2018-08-05: qty 1

## 2018-08-05 MED ORDER — HALOPERIDOL 0.5 MG PO TABS
0.5000 mg | ORAL_TABLET | ORAL | Status: DC | PRN
  Filled 2018-08-05: qty 1

## 2018-08-05 MED ORDER — HALOPERIDOL LACTATE 5 MG/ML IJ SOLN: 0.5000 mg | INTRAMUSCULAR | Status: DC | PRN

## 2018-08-05 MED ORDER — ENOXAPARIN SODIUM 40 MG/0.4ML ~~LOC~~ SOLN: 40.0000 mg | SUBCUTANEOUS | Status: DC

## 2018-08-05 MED ORDER — MORPHINE SULFATE (PF) 2 MG/ML IV SOLN
1.0000 mg | INTRAVENOUS | Status: DC | PRN
  Administered 2018-08-05: 11:00:00 1 mg via INTRAVENOUS
  Filled 2018-08-05: qty 1

## 2018-08-05 MED ORDER — MORPHINE SULFATE (PF) 4 MG/ML IV SOLN
4.0000 mg | INTRAVENOUS | Status: DC | PRN
  Administered 2018-08-05: 4 mg via INTRAVENOUS
  Filled 2018-08-05: qty 1

## 2018-08-05 MED ORDER — LORAZEPAM 2 MG/ML IJ SOLN
1.0000 mg | INTRAMUSCULAR | Status: DC | PRN
  Administered 2018-08-05 – 2018-08-07 (×3): 1 mg via INTRAVENOUS
  Filled 2018-08-05 (×3): qty 1

## 2018-08-05 MED ORDER — SODIUM CHLORIDE 0.9 % IV SOLN
2.0000 g | Freq: Three times a day (TID) | INTRAVENOUS | Status: DC
  Filled 2018-08-05 (×3): qty 2

## 2018-08-05 MED ORDER — POTASSIUM CHLORIDE 10 MEQ/100ML IV SOLN
10.0000 meq | INTRAVENOUS | Status: DC
  Administered 2018-08-05 (×2): 10 meq via INTRAVENOUS
  Filled 2018-08-05 (×2): qty 100

## 2018-08-05 MED ORDER — ONDANSETRON 4 MG PO TBDP
4.0000 mg | ORAL_TABLET | Freq: Four times a day (QID) | ORAL | Status: DC | PRN
  Filled 2018-08-05: qty 1

## 2018-08-05 MED ORDER — INSULIN ASPART 100 UNIT/ML ~~LOC~~ SOLN: 0.0000 [IU] | Freq: Every day | SUBCUTANEOUS | Status: DC

## 2018-08-05 MED ORDER — ACETAMINOPHEN 650 MG RE SUPP: 650.0000 mg | Freq: Four times a day (QID) | RECTAL | Status: DC | PRN

## 2018-08-05 MED ORDER — LORAZEPAM 2 MG/ML PO CONC: 1.0000 mg | ORAL | Status: DC | PRN

## 2018-08-05 NOTE — Progress Notes (Addendum)
[facility] NAME: Demetrice Combes    MR#:  [id number]  DATE OF BIRTH:  03-18-1941  SUBJECTIVE:  CHIEF COMPLAINT:   Chief Complaint  Patient presents with  . Hyperglycemia  . Altered Mental Status   Confused and restless.  Trying to pull on IV and trying to get out of bed. Febrile up to 102.5, tachycardic  REVIEW OF SYSTEMS:    Review of Systems  Unable to perform ROS: Mental status change    DRUG ALLERGIES:  No Known Allergies  VITALS:  Blood pressure 128/76, pulse (!) 111, temperature 100 F (37.8 C), temperature source Axillary, resp. rate (!) 24, height 6' (1.829 m), weight 63.7 kg, SpO2 98 %.  PHYSICAL EXAMINATION:   Physical Exam  GENERAL:  78 y.o.-year-old patient lying in the bed .  Restless EYES: Pupils equal, round, reactive to light and accommodation. No scleral icterus. Extraocular muscles intact.  HEENT: Head atraumatic, normocephalic. Oropharynx and nasopharynx clear.  NECK:  Supple, no jugular venous distention. No thyroid enlargement, no tenderness.  LUNGS: Normal breath sounds bilaterally, no wheezing, rales, rhonchi. No use of accessory muscles of respiration.  CARDIOVASCULAR: S1, S2 normal. No murmurs, rubs, or gallops.  ABDOMEN: Soft, nontender, nondistended. Bowel sounds present. No organomegaly or mass.  EXTREMITIES: No cyanosis, clubbing or edema b/l.   Diffuse muscle wasting NEUROLOGIC: Following some commands.  Moving all 4 extremities. PSYCHIATRIC: The patient is drowsy.  Confused  LABORATORY PANEL:   CBC Recent Labs  Lab 08/05/18 0503  WBC 9.8  HGB 9.9*  HCT 30.3*  PLT 327   ------------------------------------------------------------------------------------------------------------------ Chemistries  Recent Labs  Lab 08/04/18 2025  08/05/18 0503  NA 125*   < > 131*  K 4.1   < > 3.0*  CL 83*   < > 93*  CO2 27   < > 29  GLUCOSE 797*   < > 328*  BUN 16   < > 10  CREATININE 0.71    < > 0.48*  CALCIUM 9.0   < > 8.3*  AST 25  --   --   ALT 24  --   --   ALKPHOS 174*  --   --   BILITOT 0.6  --   --    < > = values in this interval not displayed.   ------------------------------------------------------------------------------------------------------------------  Cardiac Enzymes No results for input(s): TROPONINI in the last 168 hours. ------------------------------------------------------------------------------------------------------------------  RADIOLOGY:  Ct Head Wo Contrast  Result Date: 08/04/2018 CLINICAL DATA:  Altered mental status. Elevated glucose. EXAM: CT HEAD WITHOUT CONTRAST TECHNIQUE: Contiguous axial images were obtained from the base of the skull through the vertex without intravenous contrast. COMPARISON:  MRI brain 06/06/2018. CT head 06/06/2018. FINDINGS: Brain: Diffuse cerebral atrophy. Low-attenuation changes in the deep white matter consistent small vessel ischemia. No mass-effect or midline shift. No abnormal extra-axial fluid collections. Gray-white matter junctions are distinct. Basal cisterns are not effaced. No acute intracranial hemorrhage. No significant changes since previous study. Vascular: Mild intracranial arterial vascular calcifications. Skull: Calvarium appears intact. Sinuses/Orbits: Mucosal thickening in the paranasal sinuses. No acute air-fluid levels. Mastoid air cells are clear. Other: Mild nasal bone deformities, probably old. IMPRESSION: No acute intracranial abnormalities. Chronic atrophy and small vessel ischemic changes. Electronically Signed   By: Lucienne Capers M.D.   On: 08/04/2018 23:42   Ct Abdomen Pelvis W Contrast  Result Date: 08/04/2018 CLINICAL DATA:  Abdominal pain, hyperglycemia. Stage IV pancreatic adenocarcinoma. EXAM: CT ABDOMEN AND  PELVIS WITH CONTRAST TECHNIQUE: Multidetector CT imaging of the abdomen and pelvis was performed using the standard protocol following bolus administration of intravenous contrast.  CONTRAST:  168mL ISOVUE-300 IOPAMIDOL (ISOVUE-300) INJECTION 61% COMPARISON:  04/25/2018 FINDINGS: Motion degraded images. Lower chest: Lung bases are clear. Hepatobiliary: No focal hepatic lesions. Gallbladder is unremarkable. Moderate intrahepatic and extrahepatic ductal dilatation. Indwelling common duct stent. Pancreas: 3.2 x 4.0 cm mass in the pancreatic head (series 2/image 34), increased, corresponding to the patient's known pancreatic adenocarcinoma. Associated atrophy of the pancreatic body/tail with associated ductal dilatation (series 2/image 28). Suspected adjacent necrotic peripancreatic adenopathy, measuring up to 1.4 cm (series 2/image 28), poorly evaluated. Spleen: Within normal limits. Adrenals/Urinary Tract: 1.7 cm left adrenal nodule (series 2/image 25), previously 2.7 cm. Right adrenal gland is within normal limits. Subcentimeter renal cysts bilaterally.  No hydronephrosis. Bladder is mildly thick-walled although underdistended. Stomach/Bowel: Stomach is within normal limits. No evidence of bowel obstruction. Appendix is not discretely visualized. Mild to moderate rectal stool burden with mild fecal impaction. Vascular/Lymphatic: No evidence of abdominal aortic aneurysm. Atherosclerotic calcifications of the abdominal aorta and branch vessels. Small para-aortic nodes, including an 8 mm short axis left para-aortic node (series 2/image 33), mildly increased. Reproductive: Prostate is grossly unremarkable. Other: No abdominopelvic ascites. No frank peritoneal nodularity. Prior right lateral ventral hernia mesh repair. Musculoskeletal: Grade 1 spondylolisthesis at L5-S1. Very mild degenerative changes of the lower thoracic spine. IMPRESSION: 4.0 cm mass in the pancreatic head, corresponding to the patient's known pancreatic adenocarcinoma, mildly increased. Suspected 1.4 cm short axis necrotic peripancreatic node. Small para-aortic nodes measuring up to 8 mm short axis. 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 Moderate intrahepatic and extrahepatic ductal dilatation. Indwelling common duct stent. Electronically Signed   By: Julian Hy M.D.   On: 08/04/2018 23:50   Dg Chest Port 1 View  Result Date: 08/04/2018 CLINICAL DATA:  Altered mental status EXAM: PORTABLE CHEST 1 VIEW COMPARISON:  07/19/2018 FINDINGS: Power port type central venous catheter with tip over the low SVC region. No pneumothorax. Probable emphysematous changes in the lungs. No focal consolidation. 1 cm nodular opacity in the right mid lung is unchanged since previous study. Prominence of the right hilum could represent lymphadenopathy. This is increased. Left lung is clear. No blunting of costophrenic angles. Calcification of the aorta. Thoracic scoliosis convex towards the right. IMPRESSION: 1. Emphysematous changes in the lungs. No evidence of active pulmonary disease. 2. Stable 1 cm nodular opacity in the right mid lung. 3. Increasing prominence of right hilum may indicate lymphadenopathy. Electronically Signed   By: Lucienne Capers M.D.   On: 08/04/2018 22:30   ASSESSMENT AND PLAN:   *Severe sepsis.  Fever, tachycardia, lactic acidosis.  Altered mental status. We will bolus normal saline stat.  Check blood cultures.  Start broad-spectrum antibiotics with vancomycin and cefepime. Urinalysis negative.  Chest x-ray clear.  He does have a cough.  With prevalence of influenza I will check flu PCR. No large skin ulcers.  No neck rigidity. CT scan of the abdomen and pelvis has already been done.  No signs of infection.  Does show pancreatic cancer.    *Acute metabolic encephalopathy likely due to sepsis. CT scan of the head showed nothing acute. Fall precautions.   *Diabetes mellitus uncontrolled with Hyperglycemia Has been noncompliant with home insulin dosing. We will start Lantus 6 units daily that he takes at home. Sliding scale insulin   * COPD  -continue home meds   * HTN ,  benign -home dose  antihypertensives   * Hyperlipidemia associated with type 2 diabetes mellitus  -Home dose antilipid   * Malignant neoplasm of pancreas  Outpatient follow-up with oncology  All the records are reviewed and case discussed with Care Management/Social Workerr. Management plans discussed with the patient, family and they are in agreement.  CODE STATUS: DNR/DNI  DVT Prophylaxis: SCDs  TOTAL CRITICAL CARE TIME TAKING CARE OF THIS PATIENT: 45 minutes.   POSSIBLE D/C IN 2-3 DAYS, DEPENDING ON CLINICAL CONDITION.  Leia Alf Zamiah Tollett M.D on 08/05/2018 at 12:05 PM  Between 7am to 6pm - Pager - [phone]  After 6pm go to [email or web address] - password [email or web address] [facility]  Office  [phone]  CC: Primary care physician; Kirk Ruths, MD  Note: This dictation was prepared with Dragon dictation along with smaller phrase technology. Any transcriptional errors that result from this process are unintentional.

## 2018-08-05 NOTE — H&P (Signed)
[facility] NAME: Bryan Hicks    MR#:  [id number]  DATE OF BIRTH:  05-30-1941  DATE OF ADMISSION:  08/04/2018  PRIMARY CARE PHYSICIAN: Kirk Ruths, MD   REQUESTING/REFERRING PHYSICIAN: Cinda Quest, MD  CHIEF COMPLAINT:   Chief Complaint  Patient presents with  . Hyperglycemia  . Altered Mental Status    HISTORY OF PRESENT ILLNESS:  Bryan Hicks  is a 78 y.o. male who presents with chief complaint as above.  Patient presents to the ED with altered mental status.  Per family's report he has not been taking his insulin.  He has a history of metastatic pancreatic cancer.  He is significantly hyperglycemic on evaluation today.  He required multiple doses of Haldol and Ativan due to agitation.  He is unable to provide any more information to his history.  Hospitalist were called for admission  PAST MEDICAL HISTORY:   Past Medical History:  Diagnosis Date  . Cancer ([place])   . COPD (chronic obstructive pulmonary disease) ([place])   . Diabetes mellitus without complication ([place])   . Hypertension   . Shortness of breath dyspnea      PAST SURGICAL HISTORY:   Past Surgical History:  Procedure Laterality Date  . COLON SURGERY     "twisted intestine"  . ERCP N/A 04/26/2018   Procedure: ENDOSCOPIC RETROGRADE CHOLANGIOPANCREATOGRAPHY (ERCP);  Surgeon: Lucilla Lame, MD;  Location[facility] END[facility]COPY;  Service: Endoscopy;  Laterality: N/A;  . HERNIA MESH REMOVAL Right   . INGUINAL HERNIA REPAIR Left 12/04/2014   Procedure: HERNIA REPAIR INGUINAL ADULT;  Surgeon: Rochel Brome, MD;  Location: [facility] ORS;  Service: General;  Laterality: Left;  . PORTA CATH INSERTION N/A 05/16/2018   Procedure: PORTA CATH INSERTION;  Surgeon: Algernon Huxley, MD;  Location: [facility];  Service: Cardiovascular;  Laterality: N/A;     SOCIAL HISTORY:   Social History   Tobacco Use  . Smoking status: Current Every Day Smoker    Packs/day:  1.00    Years: 30.00    Pack years: 30.00  . Smokeless tobacco: Former Network engineer Use Topics  . Alcohol use: No     FAMILY HISTORY:   Family History  Problem Relation Age of Onset  . Colon cancer Mother   . Melanoma Father      DRUG ALLERGIES:  No Known Allergies  MEDICATIONS AT HOME:   Prior to Admission medications   Medication Sig Start Date End Date Taking? Authorizing Provider  docusate sodium (COLACE) 100 MG capsule Take 1 capsule (100 mg total) by mouth daily as needed for mild constipation. Patient not taking: Reported on 05/09/2018 05/05/18   Loletha Grayer, MD  Fluticasone-Salmeterol (ADVAIR) 250-50 MCG/DOSE AEPB Inhale 1 puff into the lungs 2 (two) times daily.     [provider]  insulin aspart (NOVOLOG) 100 UNIT/ML injection 5 units subcutaneous prior to meals if sugars greater than 150 05/05/18   Wieting, Richard, MD  LANTUS SOLOSTAR 100 UNIT/ML Solostar Pen 6 units suncutanous injection daily 05/05/18   Loletha Grayer, MD  lidocaine-prilocaine (EMLA) cream Apply to affected area once 05/13/18   Sindy Guadeloupe, MD  LORazepam (ATIVAN) 1 MG tablet Take 1 tablet (1 mg total) by mouth every 8 (eight) hours as needed for anxiety or sleep. 07/30/18   Borders, Kirt Boys, NP  Melatonin 5 MG TABS Take 1 tablet (5 mg total) by mouth at bedtime. Patient not taking: Reported on 05/09/2018  05/05/18   Loletha Grayer, MD  ondansetron (ZOFRAN) 8 MG tablet Take 1 tablet (8 mg total) by mouth 2 (two) times daily as needed (Nausea or vomiting). Patient not taking: Reported on 05/17/2018 05/13/18   Sindy Guadeloupe, MD  potassium chloride 20 MEQ/15ML (10%) SOLN TAKE 15 MLS BY MOUTH EVERY DAY FOR 4 DAYS 06/03/18   Sindy Guadeloupe, MD  potassium chloride SA (K-DUR,KLOR-CON) 20 MEQ tablet Take 1 tablet (20 mEq total) by mouth daily. 07/30/18   Sindy Guadeloupe, MD  prochlorperazine (COMPAZINE) 10 MG tablet TAKE 1 TABLET(10 MG) BY MOUTH EVERY 6 HOURS AS NEEDED FOR NAUSEA OR  VOMITING 07/11/18   Sindy Guadeloupe, MD  traZODone (DESYREL) 100 MG tablet Take 1 tablet (100 mg total) by mouth at bedtime. Patient not taking: Reported on 07/30/2018 05/05/18   Loletha Grayer, MD  vitamin B-12 (CYANOCOBALAMIN) 1000 MCG tablet Take 1,000 mcg by mouth daily.    [provider]    REVIEW OF SYSTEMS:  Review of Systems  Unable to perform ROS: Acuity of condition     VITAL SIGNS:   Vitals:   08/04/18 2215 08/04/18 2228 08/04/18 2230 08/05/18 0011  BP: (!) 147/105 139/76 139/76 133/78  Pulse: (!) 115 (!) 102 (!) 101 88  Resp: (!) 25 16 (!) 23 (!) 27  Temp:    98.9 F (37.2 C)  TempSrc:    Axillary  SpO2: 96% 96% 95% 97%  Weight:      Height:       Wt Readings from Last 3 Encounters:  08/04/18 63.7 kg  07/30/18 56.5 kg  07/19/18 59.7 kg    PHYSICAL EXAMINATION:  Physical Exam  Vitals reviewed. Constitutional: He appears well-developed and well-nourished.  HENT:  Head: Normocephalic and atraumatic.  Mouth/Throat: Oropharynx is clear and moist.  Eyes: Pupils are equal, round, and reactive to light. Conjunctivae and EOM are normal. No scleral icterus.  Neck: Normal range of motion. Neck supple. No JVD present. No thyromegaly present.  Cardiovascular: Normal rate, regular rhythm and intact distal pulses. Exam reveals no gallop and no friction rub.  No murmur heard. Respiratory: Effort normal and breath sounds normal. No respiratory distress. He has no wheezes. He has no rales.  GI: Soft. Bowel sounds are normal. He exhibits no distension. There is no abdominal tenderness.  Musculoskeletal: Normal range of motion.        General: No edema.     Comments: No arthritis, no gout  Lymphadenopathy:    He has no cervical adenopathy.  Neurological:  Unable to assess due to patient condition  Skin: Skin is warm and dry. No rash noted. No erythema.  Psychiatric:  Unable to assess due to patient condition    LABORATORY PANEL:   CBC Recent Labs  Lab  08/04/18 2025  WBC 10.8*  HGB 12.0*  HCT 36.8*  PLT 363   ------------------------------------------------------------------------------------------------------------------  Chemistries  Recent Labs  Lab 08/04/18 2025  NA 125*  K 4.1  CL 83*  CO2 27  GLUCOSE 797*  BUN 16  CREATININE 0.71  CALCIUM 9.0  AST 25  ALT 24  ALKPHOS 174*  BILITOT 0.6   ------------------------------------------------------------------------------------------------------------------  Cardiac Enzymes No results for input(s): TROPONINI in the last 168 hours. ------------------------------------------------------------------------------------------------------------------  RADIOLOGY:  Ct Head Wo Contrast  Result Date: 08/04/2018 CLINICAL DATA:  Altered mental status. Elevated glucose. EXAM: CT HEAD WITHOUT CONTRAST TECHNIQUE: Contiguous axial images were obtained from the base of the skull through the vertex without intravenous  contrast. COMPARISON:  MRI brain 06/06/2018. CT head 06/06/2018. FINDINGS: Brain: Diffuse cerebral atrophy. Low-attenuation changes in the deep white matter consistent small vessel ischemia. No mass-effect or midline shift. No abnormal extra-axial fluid collections. Gray-white matter junctions are distinct. Basal cisterns are not effaced. No acute intracranial hemorrhage. No significant changes since previous study. Vascular: Mild intracranial arterial vascular calcifications. Skull: Calvarium appears intact. Sinuses/Orbits: Mucosal thickening in the paranasal sinuses. No acute air-fluid levels. Mastoid air cells are clear. Other: Mild nasal bone deformities, probably old. IMPRESSION: No acute intracranial abnormalities. Chronic atrophy and small vessel ischemic changes. Electronically Signed   By: Lucienne Capers M.D.   On: 08/04/2018 23:42   Ct Abdomen Pelvis W Contrast  Result Date: 08/04/2018 CLINICAL DATA:  Abdominal pain, hyperglycemia. Stage IV pancreatic adenocarcinoma.  EXAM: CT ABDOMEN AND PELVIS WITH CONTRAST TECHNIQUE: Multidetector CT imaging of the abdomen and pelvis was performed using the standard protocol following bolus administration of intravenous contrast. CONTRAST:  161mL ISOVUE-300 IOPAMIDOL (ISOVUE-300) INJECTION 61% COMPARISON:  04/25/2018 FINDINGS: Motion degraded images. Lower chest: Lung bases are clear. Hepatobiliary: No focal hepatic lesions. Gallbladder is unremarkable. Moderate intrahepatic and extrahepatic ductal dilatation. Indwelling common duct stent. Pancreas: 3.2 x 4.0 cm mass in the pancreatic head (series 2/image 34), increased, corresponding to the patient's known pancreatic adenocarcinoma. Associated atrophy of the pancreatic body/tail with associated ductal dilatation (series 2/image 28). Suspected adjacent necrotic peripancreatic adenopathy, measuring up to 1.4 cm (series 2/image 28), poorly evaluated. Spleen: Within normal limits. Adrenals/Urinary Tract: 1.7 cm left adrenal nodule (series 2/image 25), previously 2.7 cm. Right adrenal gland is within normal limits. Subcentimeter renal cysts bilaterally.  No hydronephrosis. Bladder is mildly thick-walled although underdistended. Stomach/Bowel: Stomach is within normal limits. No evidence of bowel obstruction. Appendix is not discretely visualized. Mild to moderate rectal stool burden with mild fecal impaction. Vascular/Lymphatic: No evidence of abdominal aortic aneurysm. Atherosclerotic calcifications of the abdominal aorta and branch vessels. Small para-aortic nodes, including an 8 mm short axis left para-aortic node (series 2/image 33), mildly increased. Reproductive: Prostate is grossly unremarkable. Other: No abdominopelvic ascites. No frank peritoneal nodularity. Prior right lateral ventral hernia mesh repair. Musculoskeletal: Grade 1 spondylolisthesis at L5-S1. Very mild degenerative changes of the lower thoracic spine. IMPRESSION: 4.0 cm mass in the pancreatic head, corresponding to the  patient's known pancreatic adenocarcinoma, mildly increased. Suspected 1.4 cm short axis necrotic peripancreatic node. Small para-aortic nodes measuring up to 8 mm short axis. 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 Moderate intrahepatic and extrahepatic ductal dilatation. Indwelling common duct stent. Electronically Signed   By: Julian Hy M.D.   On: 08/04/2018 23:50   Dg Chest Port 1 View  Result Date: 08/04/2018 CLINICAL DATA:  Altered mental status EXAM: PORTABLE CHEST 1 VIEW COMPARISON:  07/19/2018 FINDINGS: Power port type central venous catheter with tip over the low SVC region. No pneumothorax. Probable emphysematous changes in the lungs. No focal consolidation. 1 cm nodular opacity in the right mid lung is unchanged since previous study. Prominence of the right hilum could represent lymphadenopathy. This is increased. Left lung is clear. No blunting of costophrenic angles. Calcification of the aorta. Thoracic scoliosis convex towards the right. IMPRESSION: 1. Emphysematous changes in the lungs. No evidence of active pulmonary disease. 2. Stable 1 cm nodular opacity in the right mid lung. 3. Increasing prominence of right hilum may indicate lymphadenopathy. Electronically Signed   By: Lucienne Capers M.D.   On: 08/04/2018 22:30    EKG:   Orders placed or performed  during the hospital encounter of 08/04/18  . ED EKG  . ED EKG    IMPRESSION AND PLAN:  Principal Problem:   Acute encephalopathy -possibly due to his significantly elevated glucose.  CT head was unremarkable for acute pathology.  Other lab work-up in the ED is largely within normal limits except for his elevated glucose. Active Problems:   Hyperglycemia -patient received 8 units of insulin in the ED and his glucose dropped about 200 points.  He seems to be particularly sensitive.  We will admit him with sliding scale coverage, and monitor his glucose levels closely.  I suspect his confusion may clear once his glucose  levels are better controlled   COPD (chronic obstructive pulmonary disease) ([place]) -continue home meds   HTN (hypertension), benign -home dose antihypertensives   Hyperlipidemia associated with type 2 diabetes mellitus ([place]) -Home dose antilipid   Type 2 diabetes mellitus with stage 1 chronic kidney disease, without long-term current use of insulin ([place]) -treatment as above   Malignant neoplasm of pancreas ([place]) -unclear if this is playing a role in his current status or not, treatment as above for now  Chart review performed and case discussed with ED provider. Labs, imaging and/or ECG reviewed by provider and discussed with patient/family. Management plans discussed with the patient and/or family.  DVT PROPHYLAXIS: SubQ lovenox   GI PROPHYLAXIS:  None  ADMISSION STATUS: Observation  CODE STATUS: DNR Code Status History    Date Active Date Inactive Code Status Order ID Comments User Context   05/04/2018 1330 05/05/2018 1804 DNR [id number]  Loletha Grayer, MD Inpatient   05/04/2018 0101 05/04/2018 1330 Full Code [id number]  Amelia Jo, MD Inpatient   04/26/2018 0336 04/27/2018 1825 Full Code [id number]  Lance Coon, MD Inpatient    Questions for Most Recent Historical Code Status (Order [id number])    Question Answer Comment   In the event of cardiac or respiratory ARREST Do not call a "code blue"    In the event of cardiac or respiratory ARREST Do not perform Intubation, CPR, defibrillation or ACLS    In the event of cardiac or respiratory ARREST Use medication by any route, position, wound care, and other measures to relive pain and suffering. May use oxygen, suction and manual treatment of airway obstruction as needed for comfort.    Comments nurse may pronounce         Advance Directive Documentation     Most Recent Value  Type of Advance Directive  -- [MOST form ]  Pre-existing out of facility DNR order (yellow form or pink MOST form)  -  "MOST" Form in Place?  -       TOTAL TIME TAKING CARE OF THIS PATIENT: 40 minutes.   Rowena Moilanen Kemp 08/05/2018, 12:41 AM  [employer]  [phone]  CC: Primary care physician; Kirk Ruths, MD  Note:  This document was prepared using Dragon voice recognition software and may include unintentional dictation errors.

## 2018-08-05 NOTE — Progress Notes (Signed)
Patient's blood sugar 417, 02 sats at 86%. Applied 2L 02, sats up to 94%. Notified Dr. Marcille Blanco stated he would put orders in.

## 2018-08-05 NOTE — Progress Notes (Signed)
Nutrition Brief Note  Patient identified to be seen for Malnutrition Screening Tool (MST). Chart reviewed. Patient now transitioning to comfort care.   No nutrition interventions warranted at this time. Please consult RD as needed.   Belford Pascucci, MS, RD, LDN Office: [phone] Pager: [phone] After Hours/Weekend Pager: [phone]    

## 2018-08-05 NOTE — Progress Notes (Signed)
Pharmacy Antibiotic Note  Bryan Hicks is a 78 y.o. male admitted on 08/04/2018 with sepsis.  Pharmacy has been consulted for Vancomycin and Cefepime dosing.  Plan: Vancomycin 1500 mg IV once followed by Vancomycin 750 mg IV Q 12 hrs. Goal AUC 400-550. Expected AUC: 510.5 SCr used: 0.8, used TBW for CrCl calculation T1/2=10.8 hr Will need to order peak and trough levels after 5th dose.  Cefepime 2g IV q8h   Height: 6' (182.9 cm) Weight: 140 lb 8 oz (63.7 kg) IBW/kg (Calculated) : 77.6  Temp (24hrs), Avg:99.9 F (37.7 C), Min:98.1 F (36.7 C), Max:102.5 F (39.2 C)  Recent Labs  Lab 08/04/18 2025 08/05/18 0159 08/05/18 0503  WBC 10.8*  --  9.8  CREATININE 0.71 0.50* 0.48*  LATICACIDVEN 3.46* 2.5* 2.9*    Estimated Creatinine Clearance: 71.9 mL/min (A) (by C-G formula based on SCr of 0.48 mg/dL (L)).    No Known Allergies  Antimicrobials this admission: Vancomycin 1/13 >>  Cefepime 1/13 >>   Microbiology results: 1/13 BCx: pending  Thank you for allowing pharmacy to be a part of this patient's care.  Paulina Fusi, PharmD, BCPS 08/05/2018 10:03 AM

## 2018-08-05 NOTE — Progress Notes (Signed)
New hospice home referral received from [facility] following a Palliative Medicine consult. Candace made aware there are currently no beds available. Writer to follow up with family in the morning. Patient information faxed to referral. Flo Shanks RN, BSN, [facility] and Palliative Care of Bryan Hicks, hospital Liaison [phone]

## 2018-08-05 NOTE — Plan of Care (Signed)
  Problem: Education: Goal: Knowledge of General Education information will improve Description Including pain rating scale, medication(s)/side effects and non-pharmacologic comfort measures Outcome: Not Progressing   Problem: Health Behavior/Discharge Planning: Goal: Ability to manage health-related needs will improve Outcome: Not Progressing   Problem: Clinical Measurements: Goal: Ability to maintain clinical measurements within normal limits will improve Outcome: Not Progressing Goal: Will remain free from infection Outcome: Not Progressing Goal: Diagnostic test results will improve Outcome: Not Progressing Goal: Respiratory complications will improve Outcome: Not Progressing Goal: Cardiovascular complication will be avoided Outcome: Not Progressing   Problem: Activity: Goal: Risk for activity intolerance will decrease Outcome: Not Progressing   Problem: Nutrition: Goal: Adequate nutrition will be maintained Outcome: Not Progressing   Problem: Coping: Goal: Level of anxiety will decrease Outcome: Not Progressing   Problem: Elimination: Goal: Will not experience complications related to bowel motility Outcome: Not Progressing Goal: Will not experience complications related to urinary retention Outcome: Not Progressing   Problem: Pain Managment: Goal: General experience of comfort will improve Outcome: Progressing   Problem: Safety: Goal: Ability to remain free from injury will improve Outcome: Progressing   Problem: Skin Integrity: Goal: Risk for impaired skin integrity will decrease Outcome: Progressing

## 2018-08-05 NOTE — ED Notes (Signed)
ED tech relieved safety sitter and is now at the bedside. Pt resting with lights out to enhance rest. No increased work of breathing or acute distress noted at this time.

## 2018-08-05 NOTE — ED Notes (Signed)
Sitter at bedside. Pt resting quietly with no distress noted at this time.

## 2018-08-05 NOTE — Progress Notes (Addendum)
Patient confused and unable to make decisions.  I called and spoke with patient's wife Bryan Hicks regarding patient's critical illness and worsening.  She did not have any questions and wanted me to talk to her daughter Bryan Hicks.  We discussed regarding patient's pancreatic cancer and the mass worsening on CT scan of the abdomen and pelvis.  Bryan Hicks tells me that patient seems to have given up and has not been taking his medications were following up as advised.  Discussed with her regarding patient's sepsis, unknown source of infection and pending lab work and tests.  She asked me what antibiotics he is on and how he is doing.  She tells me he has continued to decline in spite of chemotherapy and close follow-up with oncologist..  His encephalopathy is an acute change.  She is requesting that we have palliative care see the patient and he should be on hospice.  She also wants him to be comfortable.  We discussed regarding comfort measures and she agrees.  Patient will be transition to comfort measures.  Morphine, Ativan and Haldol added as needed.  Also placed palliative care consult and discussed with palliative care team. Orders entered for comfort measures.  Discussed with nursing staff.  Additional critical care time 35 minutes

## 2018-08-05 NOTE — Telephone Encounter (Signed)
Wanted to let us know that he has been admitted to hospital Room 105 AMS, Hyperglycemia

## 2018-08-05 NOTE — ED Notes (Signed)
Report called to Stephanie RN.

## 2018-08-05 NOTE — Care Management Obs Status (Signed)
[place] NOTIFICATION   Patient Details  Name: Bryan Hicks MRN: [id number] Date of Birth: 01-31-1941   Medicare Observation Status Notification Given:  Yes ; explained to patient.    Shelbie Ammons, RN 08/05/2018, 8:16 AM

## 2018-08-05 NOTE — Progress Notes (Signed)
Pastoral Care Visit    08/05/18 1500  Clinical Encounter Type  Visited With Patient and family together  Visit Type Initial  Referral From Family  Consult/Referral To Chaplain  Spiritual Encounters  Spiritual Needs Grief support;Prayer  Stress Factors  Family Stress Factors Loss;Major life changes     08/05/18 1500  Clinical Encounter Type  Visited With Patient and family together  Visit Type Initial  Referral From Family  Consult/Referral To Chaplain  Spiritual Encounters  Spiritual Needs Grief support;Prayer  Stress Factors  Family Stress Factors Loss;Major life changes   Chaplain was stopped by pt's step daughter in hall and requested to come pray for her step father.  Pt was non responsive. Family is at bedside. Family is hopeful for a placement at Hospice home soon.  Chaplain listed to pt story and prayed for pt and family.  Darcey Nora, Chaplain

## 2018-08-05 NOTE — Progress Notes (Signed)
Notified Dr. Marcille Blanco on call for temp 102.5, administered prn tylenol 650mg .

## 2018-08-05 NOTE — Progress Notes (Signed)
PHARMACY CONSULT NOTE - FOLLOW UP  Pharmacy Consult for Electrolyte Monitoring and Replacement   Recent Labs: Potassium (mmol/L)  Date Value  08/05/2018 3.0 (L)  10/27/2012 3.1 (L)   Magnesium (mg/dL)  Date Value  05/31/2018 1.5 (L)   Calcium (mg/dL)  Date Value  08/05/2018 8.3 (L)   Calcium, Total (mg/dL)  Date Value  10/27/2012 8.0 (L)   Albumin (g/dL)  Date Value  08/04/2018 3.3 (L)  10/22/2012 3.5   Sodium (mmol/L)  Date Value  08/05/2018 131 (L)  10/27/2012 131 (L)     Assessment: Patient is a 78yo male admitted for hyperglycemia and altered mental status. Patient was not taking his insulin for the past few days prior to admission. Patient has been started on sliding scale insulin and blood sugars have decreased. Patient now with hypokalemia.  Goal of Therapy:  Maintain electrolytes within range.  Plan:  Will order KCl 54mEq IV x 4 runs. Will recheck K and Mag at 18:00 tonight and recheck BMET with AM labs.   Paulina Fusi, PharmD, BCPS 08/05/2018 10:55 AM

## 2018-08-05 NOTE — Consult Note (Signed)
[facility]  Telephone:([phone] [phone]   Name: Bryan Hicks Date: 08/05/2018 MRN: [id number]  DOB: April 26, 1941  Patient Care Team: Kirk Ruths, MD as PCP - General (Internal Medicine) Clent Jacks, RN as Registered Nurse    REASON FOR CONSULTATION: Palliative Care consult requested for this 78 y.o. 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  PMH is also significant for COPD, diabetes, hypertension, and ongoing tobacco abuse.  He was recently hospitalized 05/03/2018 to 05/05/2018 for altered mental status likely secondary to hypoglycemia. Patient was also treated for fever of unknown origin and acute on chronic anemia requiring blood transfusion.  He was again hospitalized 08/05/18 for AMS and hyperglycemia. He is presumed to have an infection, although source is unclear. He was referred to the palliative care clinic for symptom management and further discussion of goals.   SOCIAL HISTORY:    Patient is married and lives at home with his wife and daughter.  Patient has a son that lives in [place].  Patient used to work as a Administrator.  ADVANCE DIRECTIVES:  Not on file. Wife is Media planner.  CODE STATUS: DNR  PAST MEDICAL HISTORY: Past Medical History:  Diagnosis Date  . Cancer ([place])   . COPD (chronic obstructive pulmonary disease) ([place])   . Diabetes mellitus without complication ([place])   . Hypertension   . Shortness of breath dyspnea     PAST SURGICAL HISTORY:  Past Surgical History:  Procedure Laterality Date  . COLON SURGERY     "twisted intestine"  . ERCP N/A 04/26/2018   Procedure: ENDOSCOPIC RETROGRADE CHOLANGIOPANCREATOGRAPHY (ERCP);  Surgeon: Lucilla Lame, MD;  Location: [facility] ENDOSCOPY;  Service: Endoscopy;  Laterality: N/A;  . HERNIA MESH REMOVAL  Right   . INGUINAL HERNIA REPAIR Left 12/04/2014   Procedure: HERNIA REPAIR INGUINAL ADULT;  Surgeon: Rochel Brome, MD;  Location: [facility] ORS;  Service: General;  Laterality: Left;  . PORTA CATH INSERTION N/A 05/16/2018   Procedure: PORTA CATH INSERTION;  Surgeon: Algernon Huxley, MD;  Location: [facility] CV LAB;  Service: Cardiovascular;  Laterality: N/A;    HEMATOLOGY/ONCOLOGY HISTORY:    Malignant neoplasm of pancreas ([place])   05/13/2018 Initial Diagnosis    Malignant neoplasm of pancreas ([place])    05/17/2018 -  Chemotherapy    The patient had PACLitaxel-protein bound (ABRAXANE) chemo infusion 175 mg, 100 mg/m2 = 175 mg (100 % of original dose 100 mg/m2), Intravenous,  Once, 2 of 5 cycles Dose modification: 100 mg/m2 (original dose 100 mg/m2, Cycle 2) Administration: 175 mg (07/05/2018), 175 mg (07/19/2018), 175 mg (07/26/2018) gemcitabine (GEMZAR) 1,400 mg in sodium chloride 0.9 % 250 mL chemo infusion, 1,406 mg (100 % of original dose 800 mg/m2), Intravenous,  Once, 3 of 6 cycles Dose modification: 800 mg/m2 (original dose 800 mg/m2, Cycle 1, Reason: Other (see comments), Comment: abnormal LFTs) Administration: 1,400 mg (05/17/2018), 1,400 mg (05/24/2018), 1,400 mg (06/14/2018), 1,400 mg (06/28/2018), 1,400 mg (07/19/2018), 1,400 mg (07/05/2018), 1,400 mg (07/26/2018)  for chemotherapy treatment.      ALLERGIES:  has No Known Allergies.  MEDICATIONS:  Current Facility-Administered Medications  Medication Dose Route Frequency Provider Last Rate Last Dose  . acetaminophen (TYLENOL) tablet 650 mg  650 mg Oral Q6H PRN Sudini, Alveta Heimlich, MD       Or  . acetaminophen (TYLENOL) suppository 650 mg  650 mg  Rectal Q6H PRN Hillary Bow, MD      . glycopyrrolate (ROBINUL) tablet 1 mg  1 mg Oral Q4H PRN Sudini, Alveta Heimlich, MD       Or  . glycopyrrolate (ROBINUL) injection 0.2 mg  0.2 mg Subcutaneous Q4H PRN Sudini, Alveta Heimlich, MD       Or  . glycopyrrolate (ROBINUL) injection 0.2 mg  0.2 mg Intravenous Q4H  PRN Sudini, Alveta Heimlich, MD      . haloperidol (HALDOL) tablet 0.5 mg  0.5 mg Oral Q4H PRN Sudini, Alveta Heimlich, MD       Or  . haloperidol (HALDOL) 2 MG/ML solution 0.5 mg  0.5 mg Sublingual Q4H PRN Sudini, Srikar, MD       Or  . haloperidol lactate (HALDOL) injection 0.5 mg  0.5 mg Intravenous Q4H PRN Sudini, Srikar, MD      . LORazepam (ATIVAN) tablet 1 mg  1 mg Oral Q4H PRN Hillary Bow, MD       Or  . LORazepam (ATIVAN) 2 MG/ML concentrated solution 1 mg  1 mg Sublingual Q4H PRN Hillary Bow, MD       Or  . LORazepam (ATIVAN) injection 1 mg  1 mg Intravenous Q4H PRN Hillary Bow, MD   1 mg at 08/05/18 1632  . morphine 4 MG/ML injection 4 mg  4 mg Intravenous Q2H PRN Hillary Bow, MD   4 mg at 08/05/18 1632  . morphine CONCENTRATE 10 MG/0.5ML oral solution 5 mg  5 mg Oral Q2H PRN Sudini, Alveta Heimlich, MD       Or  . morphine CONCENTRATE 10 MG/0.5ML oral solution 5 mg  5 mg Sublingual Q2H PRN Sudini, Srikar, MD      . ondansetron (ZOFRAN-ODT) disintegrating tablet 4 mg  4 mg Oral Q6H PRN Sudini, Srikar, MD       Or  . ondansetron (ZOFRAN) injection 4 mg  4 mg Intravenous Q6H PRN Hillary Bow, MD       Facility-Administered Medications Ordered in Other Encounters  Medication Dose Route Frequency Provider Last Rate Last Dose  . sodium chloride flush (NS) 0.9 % injection 10 mL  10 mL Intracatheter PRN Sindy Guadeloupe, MD      . sodium chloride flush (NS) 0.9 % injection 10 mL  10 mL Intravenous PRN Sindy Guadeloupe, MD   10 mL at 06/28/18 1246    VITAL SIGNS: BP 128/76 (BP Location: Left Arm)   Pulse (!) 111   Temp 100 F (37.8 C) (Axillary)   Resp (!) 24   Ht 6' (1.829 m)   Wt 140 lb 8 oz (63.7 kg)   SpO2 98%   BMI 19.06 kg/m  Filed Weights   08/04/18 2102  Weight: 140 lb 8 oz (63.7 kg)    Estimated body mass index is 19.06 kg/m as calculated from the following:   Height as of this encounter: 6' (1.829 m).   Weight as of this encounter: 140 lb 8 oz (63.7 kg).  LABS: CBC:      Component Value Date/Time   WBC 9.8 08/05/2018 0503   HGB 9.9 (L) 08/05/2018 0503   HGB 10.9 (L) 10/27/2012 0427   HCT 30.3 (L) 08/05/2018 0503   HCT 31.9 (L) 10/27/2012 0427   PLT 327 08/05/2018 0503   PLT 191 10/27/2012 0427   MCV 95.3 08/05/2018 0503   MCV 99 10/27/2012 0427   NEUTROABS 6.4 08/04/2018 2025   NEUTROABS 5.1 10/27/2012 0427   LYMPHSABS 2.1 08/04/2018 2025   LYMPHSABS  1.2 10/27/2012 0427   MONOABS 2.1 (H) 08/04/2018 2025   MONOABS 1.0 10/27/2012 0427   EOSABS 0.0 08/04/2018 2025   EOSABS 0.1 10/27/2012 0427   BASOSABS 0.1 08/04/2018 2025   BASOSABS 0.0 10/27/2012 0427   Comprehensive Metabolic Panel:    Component Value Date/Time   NA 131 (L) 08/05/2018 0503   NA 131 (L) 10/27/2012 0427   K 3.0 (L) 08/05/2018 0503   K 3.1 (L) 10/27/2012 0427   CL 93 (L) 08/05/2018 0503   CL 94 (L) 10/27/2012 0427   CO2 29 08/05/2018 0503   CO2 29 10/27/2012 0427   BUN 10 08/05/2018 0503   BUN 9 10/27/2012 0427   CREATININE 0.48 (L) 08/05/2018 0503   CREATININE 0.74 10/27/2012 0427   GLUCOSE 328 (H) 08/05/2018 0503   GLUCOSE 157 (H) 10/27/2012 0427   CALCIUM 8.3 (L) 08/05/2018 0503   CALCIUM 8.0 (L) 10/27/2012 0427   AST 25 08/04/2018 2025   AST 134 (H) 10/22/2012 0501   ALT 24 08/04/2018 2025   ALT 202 (H) 10/22/2012 0501   ALKPHOS 174 (H) 08/04/2018 2025   ALKPHOS 65 10/22/2012 0501   BILITOT 0.6 08/04/2018 2025   BILITOT 1.5 (H) 10/22/2012 0501   PROT 7.4 08/04/2018 2025   PROT 6.7 10/22/2012 0501   ALBUMIN 3.3 (L) 08/04/2018 2025   ALBUMIN 3.5 10/22/2012 0501    RADIOGRAPHIC STUDIES: Dg Chest 2 View  Result Date: 07/19/2018 CLINICAL DATA:  Cough, congestion, wheezing EXAM: CHEST - 2 VIEW COMPARISON:  06/06/2018 FINDINGS: Pain right Port-A-Cath remains in place, unchanged. There is hyperinflation of the lungs compatible with COPD. Right pulmonary nodule again noted, stable. No confluent airspace opacities or effusions. No acute bony abnormality. IMPRESSION:  COPD.  Stable right mid lung pulmonary nodule.  No active disease. Electronically Signed   By: Rolm Baptise M.D.   On: 07/19/2018 13:15   Ct Head Wo Contrast  Result Date: 08/04/2018 CLINICAL DATA:  Altered mental status. Elevated glucose. EXAM: CT HEAD WITHOUT CONTRAST TECHNIQUE: Contiguous axial images were obtained from the base of the skull through the vertex without intravenous contrast. COMPARISON:  MRI brain 06/06/2018. CT head 06/06/2018. FINDINGS: Brain: Diffuse cerebral atrophy. Low-attenuation changes in the deep white matter consistent small vessel ischemia. No mass-effect or midline shift. No abnormal extra-axial fluid collections. Gray-white matter junctions are distinct. Basal cisterns are not effaced. No acute intracranial hemorrhage. No significant changes since previous study. Vascular: Mild intracranial arterial vascular calcifications. Skull: Calvarium appears intact. Sinuses/Orbits: Mucosal thickening in the paranasal sinuses. No acute air-fluid levels. Mastoid air cells are clear. Other: Mild nasal bone deformities, probably old. IMPRESSION: No acute intracranial abnormalities. Chronic atrophy and small vessel ischemic changes. Electronically Signed   By: Lucienne Capers M.D.   On: 08/04/2018 23:42   Ct Abdomen Pelvis W Contrast  Result Date: 08/04/2018 CLINICAL DATA:  Abdominal pain, hyperglycemia. Stage IV pancreatic adenocarcinoma. EXAM: CT ABDOMEN AND PELVIS WITH CONTRAST TECHNIQUE: Multidetector CT imaging of the abdomen and pelvis was performed using the standard protocol following bolus administration of intravenous contrast. CONTRAST:  111m ISOVUE-300 IOPAMIDOL (ISOVUE-300) INJECTION 61% COMPARISON:  04/25/2018 FINDINGS: Motion degraded images. Lower chest: Lung bases are clear. Hepatobiliary: No focal hepatic lesions. Gallbladder is unremarkable. Moderate intrahepatic and extrahepatic ductal dilatation. Indwelling common duct stent. Pancreas: 3.2 x 4.0 cm mass in the  pancreatic head (series 2/image 34), increased, corresponding to the patient's known pancreatic adenocarcinoma. Associated atrophy of the pancreatic body/tail with associated ductal dilatation (series 2/image 28).  Suspected adjacent necrotic peripancreatic adenopathy, measuring up to 1.4 cm (series 2/image 28), poorly evaluated. Spleen: Within normal limits. Adrenals/Urinary Tract: 1.7 cm left adrenal nodule (series 2/image 25), previously 2.7 cm. Right adrenal gland is within normal limits. Subcentimeter renal cysts bilaterally.  No hydronephrosis. Bladder is mildly thick-walled although underdistended. Stomach/Bowel: Stomach is within normal limits. No evidence of bowel obstruction. Appendix is not discretely visualized. Mild to moderate rectal stool burden with mild fecal impaction. Vascular/Lymphatic: No evidence of abdominal aortic aneurysm. Atherosclerotic calcifications of the abdominal aorta and branch vessels. Small para-aortic nodes, including an 8 mm short axis left para-aortic node (series 2/image 33), mildly increased. Reproductive: Prostate is grossly unremarkable. Other: No abdominopelvic ascites. No frank peritoneal nodularity. Prior right lateral ventral hernia mesh repair. Musculoskeletal: Grade 1 spondylolisthesis at L5-S1. Very mild degenerative changes of the lower thoracic spine. IMPRESSION: 4.0 cm mass in the pancreatic head, corresponding to the patient's known pancreatic adenocarcinoma, mildly increased. Suspected 1.4 cm short axis necrotic peripancreatic node. Small para-aortic nodes measuring up to 8 mm short axis. 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 Moderate intrahepatic and extrahepatic ductal dilatation. Indwelling common duct stent. Electronically Signed   By: Julian Hy M.D.   On: 08/04/2018 23:50   Dg Chest Port 1 View  Result Date: 08/04/2018 CLINICAL DATA:  Altered mental status EXAM: PORTABLE CHEST 1 VIEW COMPARISON:  07/19/2018 FINDINGS: Power port type central  venous catheter with tip over the low SVC region. No pneumothorax. Probable emphysematous changes in the lungs. No focal consolidation. 1 cm nodular opacity in the right mid lung is unchanged since previous study. Prominence of the right hilum could represent lymphadenopathy. This is increased. Left lung is clear. No blunting of costophrenic angles. Calcification of the aorta. Thoracic scoliosis convex towards the right. IMPRESSION: 1. Emphysematous changes in the lungs. No evidence of active pulmonary disease. 2. Stable 1 cm nodular opacity in the right mid lung. 3. Increasing prominence of right hilum may indicate lymphadenopathy. Electronically Signed   By: Lucienne Capers M.D.   On: 08/04/2018 22:30    PERFORMANCE STATUS (ECOG) : 4 - Bedbound  Review of Systems As noted above. Otherwise, a complete review of systems is negative.  Physical Exam General: ill appearing Cardiovascular: regular rate and rhythm Pulmonary: clear ant fields Abdomen: soft, nontender, + bowel sounds Extremities: no edema Skin: no rashes Neurological: poorly responsive, hands in mitts  IMPRESSION: Patient is confused. He reportedly has been agitated overnight and has required a sitter and soft/mitt restraints. He is now comfortable appearing after doses of morphine and lorazepam.   I met with patient's wife and brother. Wife relates to me her conversation earlier with hospitalist and says she feels that patient is likely approaching end of life. She confirms family decision to just focus on his comfort. Patient was transitioned to comfort care earlier today and antibiotics and other non-symptom medications were stopped. Wife says she would like for patient to transfer to the [facility] when a bed is available. I will consult SW to help coordinate discharge.   Case discussed with Dr. Janese Banks and SW  PLAN: DNR Comfort care orders reviewed Hospice Home when a bed is available   Time Total: 30 minutes  Visit  consisted of counseling and education dealing with the complex and emotionally intense issues of symptom management and palliative care in the setting of serious and potentially life-threatening illness.Greater than 50%  of this time was spent counseling and coordinating care related to the above assessment  and plan.  Signed by: Altha Harm, PhD, NP-C [phone] (Work Cell)

## 2018-08-05 NOTE — Care Management Note (Signed)
Case Management Note  Patient Details  Name: Bryan Hicks MRN: [id number] Date of Birth: April 01, 1941  Subjective/Objective:     Admitted to [facility] under observation status with the diagnosis of acute encephalopathy. Lives with wife, Charlett Nose [phone]) and daughter Colletta [place]  [phone]).   Sees Dr. Ouida Sills as primary care physician. Goes to the [facility]. Discussining the initiation of chemotherapy. Family/patient met with Altha Harm, RN Palliative representative for the [facility] 07/30/18.  Presented post fall to the emergency room Temperature = 102.5 Sitter at the bedside.             Action/Plan: Will continue to follow for transition of care needs.   Expected Discharge Date:                  Expected Discharge Plan:     In-House Referral:   yes  Discharge planning Services   yes  Post Acute Care Choice:    Choice offered to:     DME Arranged:    DME Agency:     HH Arranged:    HH Agency:     Status of Service:     If discussed at [employer] of Stay Meetings, dates discussed:    Additional Comments:  Shelbie Ammons, RN MSN CCM Care Management [phone] 08/05/2018, 8:35 AM

## 2018-08-05 NOTE — Clinical Social Work Note (Signed)
CSW notified by Palliative NP that patient's family would like hospice home. CSW spoke with patient's wife and brother at bedside regarding hospice home. Family would like patient to go to hospice home in [place]. CSW explained that there are no beds available today but patient would be put on the waitlist for an available bed. Family states understanding. CSW notified Santiago Glad, hospice liaison of referral.   Annamaria Boots MSW, Latanya Presser [phone]

## 2018-08-06 ENCOUNTER — Telehealth: Payer: Self-pay | Admitting: *Deleted

## 2018-08-06 ENCOUNTER — Ambulatory Visit: Admission: RE | Admit: 2018-08-06 | Payer: Medicare Other | Source: Ambulatory Visit

## 2018-08-06 MED ORDER — LORAZEPAM 1 MG PO TABS: 1.0000 mg | ORAL_TABLET | ORAL | 0 refills | Status: AC | PRN

## 2018-08-06 MED ORDER — MORPHINE SULFATE (CONCENTRATE) 10 MG/0.5ML PO SOLN: 5.0000 mg | ORAL | 0 refills | Status: AC | PRN

## 2018-08-06 NOTE — Telephone Encounter (Signed)
Contacted Triage nurse [place] with order

## 2018-08-06 NOTE — Care Management (Signed)
Patient's family has now decided to discharge home rather than go to the Hospice Home.   Will need hospital bed.  Daughter is trying to rally caregiver support in the home.  Family will transport home.  Anticipate discharge 1.15.2020

## 2018-08-06 NOTE — Progress Notes (Signed)
Following a discussion with Palliative NP Josh Borders, family contemplated taking patient home with the support of hospice services. After more discussion between patient's  daughter Colletta [place] and patient's wife, they have decided to wait until tomorrow to make a final decision. Mrs. Belsito is very concerned that patient will "not listen at home" and try to do things " he just can't do". Writer and [facility] provided emotional support to [place] and Mrs. [place]. Patient is more alert today and drinking water and eating bites. Has not required any PRN's for agitation or pain. Will continue to follow. Hospital care team all updated. Thank you. Flo Shanks RN,BSN, [facility] and Palliative Care of Gara Kroner, hospital Liaison [phone]

## 2018-08-06 NOTE — Progress Notes (Signed)
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 PMH is also significant for COPD, diabetes, hypertension, and ongoing tobacco abuse. He was recently hospitalized 05/03/2018 to 05/05/2018 for altered mental status likely secondary to hypoglycemia.Patient was also treated for fever of unknown origin and acute on chronic anemia requiring blood transfusion. He was again hospitalized 08/05/18 for AMS and hyperglycemia. He is presumed to have an infection, although source is unclear. He was referred to the palliative care clinic for symptom management and further discussion of goals.   CODE STATUS: DNR  PAST MEDICAL HISTORY: Past Medical History:  Diagnosis Date  . Cancer ([place])   . COPD (chronic obstructive pulmonary disease) ([place])   . Diabetes mellitus without complication ([place])   . Hypertension   . Shortness of breath dyspnea     PAST SURGICAL HISTORY:  Past Surgical History:  Procedure Laterality Date  . COLON SURGERY     "twisted intestine"  . ERCP N/A 04/26/2018   Procedure: ENDOSCOPIC RETROGRADE CHOLANGIOPANCREATOGRAPHY (ERCP);  Surgeon: Lucilla Lame, MD;  Location: [facility] ENDOSCOPY;  Service: Endoscopy;  Laterality: N/A;  . HERNIA MESH REMOVAL Right   . INGUINAL HERNIA REPAIR Left 12/04/2014   Procedure: HERNIA REPAIR INGUINAL ADULT;  Surgeon: Rochel Brome, MD;  Location: [facility] ORS;  Service: General;  Laterality: Left;  . PORTA CATH INSERTION N/A 05/16/2018   Procedure: PORTA  CATH INSERTION;  Surgeon: Algernon Huxley, MD;  Location: [facility];  Service: Cardiovascular;  Laterality: N/A;    HEMATOLOGY/ONCOLOGY HISTORY:    Malignant neoplasm of pancreas ([place])   05/13/2018 Initial Diagnosis    Malignant neoplasm of pancreas ([place])    05/17/2018 -  Chemotherapy    The patient had PACLitaxel-protein bound (ABRAXANE) chemo infusion 175 mg, 100 mg/m2 = 175 mg (100 % of original dose 100 mg/m2), Intravenous,  Once, 2 of 5 cycles Dose modification: 100 mg/m2 (original dose 100 mg/m2, Cycle 2) Administration: 175 mg (07/05/2018), 175 mg (07/19/2018), 175 mg (07/26/2018) gemcitabine (GEMZAR) 1,400 mg in sodium chloride 0.9 % 250 mL chemo infusion, 1,406 mg (100 % of original dose 800 mg/m2), Intravenous,  Once, 3 of 6 cycles Dose modification: 800 mg/m2 (original dose 800 mg/m2, Cycle 1, Reason: Other (see comments), Comment: abnormal LFTs) Administration: 1,400 mg (05/17/2018), 1,400 mg (05/24/2018), 1,400 mg (06/14/2018), 1,400 mg (06/28/2018), 1,400 mg (07/19/2018), 1,400 mg (07/05/2018), 1,400 mg (07/26/2018)  for chemotherapy treatment.      ALLERGIES:  has No Known Allergies.  MEDICATIONS:  Current Facility-Administered Medications  Medication Dose Route Frequency Provider Last Rate Last Dose  . acetaminophen (TYLENOL) tablet 650 mg  650 mg Oral Q6H PRN Hillary Bow, MD       Or  . acetaminophen (TYLENOL) suppository 650 mg  650 mg Rectal Q6H PRN Sudini, Srikar, MD      . glycopyrrolate (ROBINUL) tablet 1 mg  1 mg Oral Q4H PRN Sudini, Srikar, MD       Or  . glycopyrrolate (ROBINUL) injection 0.2 mg  0.2 mg Subcutaneous Q4H PRN Hillary Bow, MD  Or  . glycopyrrolate (ROBINUL) injection 0.2 mg  0.2 mg Intravenous Q4H PRN Sudini, Alveta Heimlich, MD      . haloperidol (HALDOL) tablet 0.5 mg  0.5 mg Oral Q4H PRN Sudini, Alveta Heimlich, MD       Or  . haloperidol (HALDOL) 2 MG/ML solution 0.5 mg  0.5 mg Sublingual Q4H PRN Sudini, Srikar, MD       Or  . haloperidol  lactate (HALDOL) injection 0.5 mg  0.5 mg Intravenous Q4H PRN Sudini, Srikar, MD      . LORazepam (ATIVAN) tablet 1 mg  1 mg Oral Q4H PRN Hillary Bow, MD       Or  . LORazepam (ATIVAN) 2 MG/ML concentrated solution 1 mg  1 mg Sublingual Q4H PRN Hillary Bow, MD       Or  . LORazepam (ATIVAN) injection 1 mg  1 mg Intravenous Q4H PRN Hillary Bow, MD   1 mg at 08/06/18 0147  . morphine 4 MG/ML injection 4 mg  4 mg Intravenous Q2H PRN Hillary Bow, MD   4 mg at 08/05/18 1632  . morphine CONCENTRATE 10 MG/0.5ML oral solution 5 mg  5 mg Oral Q2H PRN Sudini, Alveta Heimlich, MD       Or  . morphine CONCENTRATE 10 MG/0.5ML oral solution 5 mg  5 mg Sublingual Q2H PRN Sudini, Srikar, MD      . ondansetron (ZOFRAN-ODT) disintegrating tablet 4 mg  4 mg Oral Q6H PRN Sudini, Srikar, MD       Or  . ondansetron (ZOFRAN) injection 4 mg  4 mg Intravenous Q6H PRN Hillary Bow, MD       Facility-Administered Medications Ordered in Other Encounters  Medication Dose Route Frequency Provider Last Rate Last Dose  . sodium chloride flush (NS) 0.9 % injection 10 mL  10 mL Intracatheter PRN Sindy Guadeloupe, MD      . sodium chloride flush (NS) 0.9 % injection 10 mL  10 mL Intravenous PRN Sindy Guadeloupe, MD   10 mL at 06/28/18 1246    VITAL SIGNS: BP 128/76 (BP Location: Left Arm)   Pulse (!) 111   Temp 100 F (37.8 C) (Axillary)   Resp (!) 24   Ht 6' (1.829 m)   Wt 140 lb 8 oz (63.7 kg)   SpO2 98%   BMI 19.06 kg/m  Filed Weights   08/04/18 2102  Weight: 140 lb 8 oz (63.7 kg)    Estimated body mass index is 19.06 kg/m as calculated from the following:   Height as of this encounter: 6' (1.829 m).   Weight as of this encounter: 140 lb 8 oz (63.7 kg).  LABS: CBC:    Component Value Date/Time   WBC 9.8 08/05/2018 0503   HGB 9.9 (L) 08/05/2018 0503   HGB 10.9 (L) 10/27/2012 0427   HCT 30.3 (L) 08/05/2018 0503   HCT 31.9 (L) 10/27/2012 0427   PLT 327 08/05/2018 0503   PLT 191 10/27/2012 0427   MCV  95.3 08/05/2018 0503   MCV 99 10/27/2012 0427   NEUTROABS 6.4 08/04/2018 2025   NEUTROABS 5.1 10/27/2012 0427   LYMPHSABS 2.1 08/04/2018 2025   LYMPHSABS 1.2 10/27/2012 0427   MONOABS 2.1 (H) 08/04/2018 2025   MONOABS 1.0 10/27/2012 0427   EOSABS 0.0 08/04/2018 2025   EOSABS 0.1 10/27/2012 0427   BASOSABS 0.1 08/04/2018 2025   BASOSABS 0.0 10/27/2012 0427   Comprehensive Metabolic Panel:    Component Value Date/Time   NA 131 (L)  08/05/2018 0503   NA 131 (L) 10/27/2012 0427   K 3.0 (L) 08/05/2018 0503   K 3.1 (L) 10/27/2012 0427   CL 93 (L) 08/05/2018 0503   CL 94 (L) 10/27/2012 0427   CO2 29 08/05/2018 0503   CO2 29 10/27/2012 0427   BUN 10 08/05/2018 0503   BUN 9 10/27/2012 0427   CREATININE 0.48 (L) 08/05/2018 0503   CREATININE 0.74 10/27/2012 0427   GLUCOSE 328 (H) 08/05/2018 0503   GLUCOSE 157 (H) 10/27/2012 0427   CALCIUM 8.3 (L) 08/05/2018 0503   CALCIUM 8.0 (L) 10/27/2012 0427   AST 25 08/04/2018 2025   AST 134 (H) 10/22/2012 0501   ALT 24 08/04/2018 2025   ALT 202 (H) 10/22/2012 0501   ALKPHOS 174 (H) 08/04/2018 2025   ALKPHOS 65 10/22/2012 0501   BILITOT 0.6 08/04/2018 2025   BILITOT 1.5 (H) 10/22/2012 0501   PROT 7.4 08/04/2018 2025   PROT 6.7 10/22/2012 0501   ALBUMIN 3.3 (L) 08/04/2018 2025   ALBUMIN 3.5 10/22/2012 0501    RADIOGRAPHIC STUDIES: Dg Chest 2 View  Result Date: 07/19/2018 CLINICAL DATA:  Cough, congestion, wheezing EXAM: CHEST - 2 VIEW COMPARISON:  06/06/2018 FINDINGS: Pain right Port-A-Cath remains in place, unchanged. There is hyperinflation of the lungs compatible with COPD. Right pulmonary nodule again noted, stable. No confluent airspace opacities or effusions. No acute bony abnormality. IMPRESSION: COPD.  Stable right mid lung pulmonary nodule.  No active disease. Electronically Signed   By: Rolm Baptise M.D.   On: 07/19/2018 13:15   Ct Head Wo Contrast  Result Date: 08/04/2018 CLINICAL DATA:  Altered mental status. Elevated  glucose. EXAM: CT HEAD WITHOUT CONTRAST TECHNIQUE: Contiguous axial images were obtained from the base of the skull through the vertex without intravenous contrast. COMPARISON:  MRI brain 06/06/2018. CT head 06/06/2018. FINDINGS: Brain: Diffuse cerebral atrophy. Low-attenuation changes in the deep white matter consistent small vessel ischemia. No mass-effect or midline shift. No abnormal extra-axial fluid collections. Gray-white matter junctions are distinct. Basal cisterns are not effaced. No acute intracranial hemorrhage. No significant changes since previous study. Vascular: Mild intracranial arterial vascular calcifications. Skull: Calvarium appears intact. Sinuses/Orbits: Mucosal thickening in the paranasal sinuses. No acute air-fluid levels. Mastoid air cells are clear. Other: Mild nasal bone deformities, probably old. IMPRESSION: No acute intracranial abnormalities. Chronic atrophy and small vessel ischemic changes. Electronically Signed   By: Lucienne Capers M.D.   On: 08/04/2018 23:42   Ct Abdomen Pelvis W Contrast  Result Date: 08/04/2018 CLINICAL DATA:  Abdominal pain, hyperglycemia. Stage IV pancreatic adenocarcinoma. EXAM: CT ABDOMEN AND PELVIS WITH CONTRAST TECHNIQUE: Multidetector CT imaging of the abdomen and pelvis was performed using the standard protocol following bolus administration of intravenous contrast. CONTRAST:  121mL ISOVUE-300 IOPAMIDOL (ISOVUE-300) INJECTION 61% COMPARISON:  04/25/2018 FINDINGS: Motion degraded images. Lower chest: Lung bases are clear. Hepatobiliary: No focal hepatic lesions. Gallbladder is unremarkable. Moderate intrahepatic and extrahepatic ductal dilatation. Indwelling common duct stent. Pancreas: 3.2 x 4.0 cm mass in the pancreatic head (series 2/image 34), increased, corresponding to the patient's known pancreatic adenocarcinoma. Associated atrophy of the pancreatic body/tail with associated ductal dilatation (series 2/image 28). Suspected adjacent necrotic  peripancreatic adenopathy, measuring up to 1.4 cm (series 2/image 28), poorly evaluated. Spleen: Within normal limits. Adrenals/Urinary Tract: 1.7 cm left adrenal nodule (series 2/image 25), previously 2.7 cm. Right adrenal gland is within normal limits. Subcentimeter renal cysts bilaterally.  No hydronephrosis. Bladder is mildly thick-walled although underdistended. Stomach/Bowel: Stomach is within  normal limits. No evidence of bowel obstruction. Appendix is not discretely visualized. Mild to moderate rectal stool burden with mild fecal impaction. Vascular/Lymphatic: No evidence of abdominal aortic aneurysm. Atherosclerotic calcifications of the abdominal aorta and branch vessels. Small para-aortic nodes, including an 8 mm short axis left para-aortic node (series 2/image 33), mildly increased. Reproductive: Prostate is grossly unremarkable. Other: No abdominopelvic ascites. No frank peritoneal nodularity. Prior right lateral ventral hernia mesh repair. Musculoskeletal: Grade 1 spondylolisthesis at L5-S1. Very mild degenerative changes of the lower thoracic spine. IMPRESSION: 4.0 cm mass in the pancreatic head, corresponding to the patient's known pancreatic adenocarcinoma, mildly increased. Suspected 1.4 cm short axis necrotic peripancreatic node. Small para-aortic nodes measuring up to 8 mm short axis. 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 Moderate intrahepatic and extrahepatic ductal dilatation. Indwelling common duct stent. Electronically Signed   By: Julian Hy M.D.   On: 08/04/2018 23:50   Dg Chest Port 1 View  Result Date: 08/04/2018 CLINICAL DATA:  Altered mental status EXAM: PORTABLE CHEST 1 VIEW COMPARISON:  07/19/2018 FINDINGS: Power port type central venous catheter with tip over the low SVC region. No pneumothorax. Probable emphysematous changes in the lungs. No focal consolidation. 1 cm nodular opacity in the right mid lung is unchanged since previous study. Prominence of the right  hilum could represent lymphadenopathy. This is increased. Left lung is clear. No blunting of costophrenic angles. Calcification of the aorta. Thoracic scoliosis convex towards the right. IMPRESSION: 1. Emphysematous changes in the lungs. No evidence of active pulmonary disease. 2. Stable 1 cm nodular opacity in the right mid lung. 3. Increasing prominence of right hilum may indicate lymphadenopathy. Electronically Signed   By: Lucienne Capers M.D.   On: 08/04/2018 22:30    PERFORMANCE STATUS (ECOG) : 4 - Bedbound  Review of Systems As noted above. Otherwise, a complete review of systems is negative.  Physical Exam General: ill appearing Cardiovascular: regular rate and rhythm Pulmonary: clear ant fields Abdomen: soft, nontender, + bowel sounds Extremities: no edema, no joint deformities Skin: no rashes Neurological: alert, answers questions, confused  IMPRESSION: Patient is more alert today. He knows he is at [facility] in [place], [place]. He says "Get me out of here." Patient is eating some today. Overall, he appears comfortable and denies distressing symptoms. He remains on comfort care.   I spoke with patient's wife and daughter by phone. Family agrees that he has improved but they still want to focus on his comfort. It is my understanding that there is not a bed at the [facility] today. Family would like to coordinate discharge from the hospital if possible. We talked about the option of him going home with hospice care and family is interested in that if possible. They would be interested in future utilization of the Hospice Home in the event of decline. Will consult CM.   Attending updated.   PLAN: Home with hospice if can be coordinated CM consulted   Time Total: 20 minutes  Visit consisted of counseling and education dealing with the complex and emotionally intense issues of symptom management and palliative care in the setting of serious and potentially life-threatening  illness.Greater than 50%  of this time was spent counseling and coordinating care related to the above assessment and plan.  Signed by: Altha Harm, PhD, NP-C [phone] (Work Cell)

## 2018-08-06 NOTE — Progress Notes (Signed)
CSW Candace and attending physician Dr. Darvin Neighbours notified that patient remains on the hospice home wait list. Per staff RN Dedra patient is more  alert today and eating some. Safety sitter remains at bedside. Will continue to follow and update team regarding bed availability. Thank you. Flo Shanks RN, BSN, [place] and Palliative care of [place], [facility] [place] [phone]

## 2018-08-06 NOTE — Telephone Encounter (Signed)
yes

## 2018-08-06 NOTE — Progress Notes (Signed)
[place] [place] NAME: Macaulay Reicher    MR#:  [id number]  DATE OF BIRTH:  [birth year]  SUBJECTIVE:  CHIEF COMPLAINT:   Chief Complaint  Patient presents with  . Hyperglycemia  . Altered Mental Status   Awake and alert today Safety sitter at bedside  REVIEW OF SYSTEMS:    Review of Systems  Unable to perform ROS: Mental status change    DRUG ALLERGIES:  No Known Allergies  VITALS:  Blood pressure 128/76, pulse (!) 111, temperature 100 F (37.8 C), temperature source Axillary, resp. rate (!) 24, height 6' (1.829 m), weight 63.7 kg, SpO2 98 %.  PHYSICAL EXAMINATION:   Physical Exam  GENERAL:  78 y.o.-year-old patient lying in the bed .  EYES: Pupils equal, round, reactive to light and accommodation. No scleral icterus. Extraocular muscles intact.  HEENT: Head atraumatic, normocephalic. Oropharynx and nasopharynx clear.  NECK:  Supple, no jugular venous distention. No thyroid enlargement, no tenderness.  LUNGS: Normal breath sounds bilaterally, no wheezing, rales, rhonchi. No use of accessory muscles of respiration.  CARDIOVASCULAR: S1, S2 normal. No murmurs, rubs, or gallops.  ABDOMEN: Soft, nontender, nondistended. Bowel sounds present. No organomegaly or mass.  EXTREMITIES: No cyanosis, clubbing or edema b/l.   Diffuse muscle wasting NEUROLOGIC: Following some commands.  Moving all 4 extremities. PSYCHIATRIC: The patient is awake and alert.  LABORATORY PANEL:   CBC Recent Labs  Lab 08/05/18 0503  WBC 9.8  HGB 9.9*  HCT 30.3*  PLT 327   ------------------------------------------------------------------------------------------------------------------ Chemistries  Recent Labs  Lab 08/04/18 2025  08/05/18 0503  NA 125*   < > 131*  K 4.1   < > 3.0*  CL 83*   < > 93*  CO2 27   < > 29  GLUCOSE 797*   < > 328*  BUN 16   < > 10  CREATININE 0.71   < > 0.48*  CALCIUM 9.0   < > 8.3*  AST 25  --   --   ALT 24  --    --   ALKPHOS 174*  --   --   BILITOT 0.6  --   --    < > = values in this interval not displayed.   ------------------------------------------------------------------------------------------------------------------  Cardiac Enzymes No results for input(s): TROPONINI in the last 168 hours. ------------------------------------------------------------------------------------------------------------------  RADIOLOGY:  Ct Head Wo Contrast  Result Date: 08/04/2018 CLINICAL DATA:  Altered mental status. Elevated glucose. EXAM: CT HEAD WITHOUT CONTRAST TECHNIQUE: Contiguous axial images were obtained from the base of the skull through the vertex without intravenous contrast. COMPARISON:  MRI brain 06/06/2018. CT head 06/06/2018. FINDINGS: Brain: Diffuse cerebral atrophy. Low-attenuation changes in the deep white matter consistent small vessel ischemia. No mass-effect or midline shift. No abnormal extra-axial fluid collections. Gray-white matter junctions are distinct. Basal cisterns are not effaced. No acute intracranial hemorrhage. No significant changes since previous study. Vascular: Mild intracranial arterial vascular calcifications. Skull: Calvarium appears intact. Sinuses/Orbits: Mucosal thickening in the paranasal sinuses. No acute air-fluid levels. Mastoid air cells are clear. Other: Mild nasal bone deformities, probably old. IMPRESSION: No acute intracranial abnormalities. Chronic atrophy and small vessel ischemic changes. Electronically Signed   By: Lucienne Capers M.D.   On: 08/04/2018 23:42   Ct Abdomen Pelvis W Contrast  Result Date: 08/04/2018 CLINICAL DATA:  Abdominal pain, hyperglycemia. Stage IV pancreatic adenocarcinoma. EXAM: CT ABDOMEN AND PELVIS WITH CONTRAST TECHNIQUE: Multidetector CT imaging of the abdomen and pelvis was performed  using the standard protocol following bolus administration of intravenous contrast. CONTRAST:  157mL ISOVUE-300 IOPAMIDOL (ISOVUE-300) INJECTION 61%  COMPARISON:  04/25/2018 FINDINGS: Motion degraded images. Lower chest: Lung bases are clear. Hepatobiliary: No focal hepatic lesions. Gallbladder is unremarkable. Moderate intrahepatic and extrahepatic ductal dilatation. Indwelling common duct stent. Pancreas: 3.2 x 4.0 cm mass in the pancreatic head (series 2/image 34), increased, corresponding to the patient's known pancreatic adenocarcinoma. Associated atrophy of the pancreatic body/tail with associated ductal dilatation (series 2/image 28). Suspected adjacent necrotic peripancreatic adenopathy, measuring up to 1.4 cm (series 2/image 28), poorly evaluated. Spleen: Within normal limits. Adrenals/Urinary Tract: 1.7 cm left adrenal nodule (series 2/image 25), previously 2.7 cm. Right adrenal gland is within normal limits. Subcentimeter renal cysts bilaterally.  No hydronephrosis. Bladder is mildly thick-walled although underdistended. Stomach/Bowel: Stomach is within normal limits. No evidence of bowel obstruction. Appendix is not discretely visualized. Mild to moderate rectal stool burden with mild fecal impaction. Vascular/Lymphatic: No evidence of abdominal aortic aneurysm. Atherosclerotic calcifications of the abdominal aorta and branch vessels. Small para-aortic nodes, including an 8 mm short axis left para-aortic node (series 2/image 33), mildly increased. Reproductive: Prostate is grossly unremarkable. Other: No abdominopelvic ascites. No frank peritoneal nodularity. Prior right lateral ventral hernia mesh repair. Musculoskeletal: Grade 1 spondylolisthesis at L5-S1. Very mild degenerative changes of the lower thoracic spine. IMPRESSION: 4.0 cm mass in the pancreatic head, corresponding to the patient's known pancreatic adenocarcinoma, mildly increased. Suspected 1.4 cm short axis necrotic peripancreatic node. Small para-aortic nodes measuring up to 8 mm short axis. 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 Moderate intrahepatic and extrahepatic ductal  dilatation. Indwelling common duct stent. Electronically Signed   By: Julian Hy M.D.   On: 08/04/2018 23:50   Dg Chest Port 1 View  Result Date: 08/04/2018 CLINICAL DATA:  Altered mental status EXAM: PORTABLE CHEST 1 VIEW COMPARISON:  07/19/2018 FINDINGS: Power port type central venous catheter with tip over the low SVC region. No pneumothorax. Probable emphysematous changes in the lungs. No focal consolidation. 1 cm nodular opacity in the right mid lung is unchanged since previous study. Prominence of the right hilum could represent lymphadenopathy. This is increased. Left lung is clear. No blunting of costophrenic angles. Calcification of the aorta. Thoracic scoliosis convex towards the right. IMPRESSION: 1. Emphysematous changes in the lungs. No evidence of active pulmonary disease. 2. Stable 1 cm nodular opacity in the right mid lung. 3. Increasing prominence of right hilum may indicate lymphadenopathy. Electronically Signed   By: Lucienne Capers M.D.   On: 08/04/2018 22:30   ASSESSMENT AND PLAN:   *Severe sepsis   *Acute metabolic encephalopathy likely due to sepsis.  *Diabetes mellitus uncontrolled with Hyperglycemia  * COPD  * HTN  * Hyperlipidemia  * Malignant neoplasm of pancreas   Patient on comfort measures as requested by family.  Waiting for hospice home bed.  All the records are reviewed and case discussed with Care Management/Social Workerr. Management plans discussed with the patient, family and they are in agreement.  CODE STATUS: DNR/DNI  TOTAL  TIME TAKING CARE OF THIS PATIENT: 25 minutes.  Than 50% time spent in discussing with family and coordination of care  POSSIBLE D/C IN 1-2 DAYS, DEPENDING ON CLINICAL CONDITION.  Neita Carp M.D on 08/06/2018 at 12:06 PM  Between 7am to 6pm - Pager - [phone]  After 6pm go to [email or web address] - password [email or web address] [facility]  Office  [phone]  CC: Primary care physician; Frazier Richards  W, MD  Note: This dictation was prepared with Dragon dictation along with smaller phrase technology. Any transcriptional errors that result from this process are unintentional.

## 2018-08-06 NOTE — Telephone Encounter (Signed)
Patient discharging from hospital today with referral to hospice, asking if Dr Janese Banks will sign orders to be attending. Please advise

## 2018-08-07 MED ORDER — HEPARIN SOD (PORK) LOCK FLUSH 100 UNIT/ML IV SOLN
500.0000 [IU] | Freq: Once | INTRAVENOUS | Status: AC
  Administered 2018-08-07: 500 [IU] via INTRAVENOUS
  Filled 2018-08-07: qty 5

## 2018-08-07 NOTE — Progress Notes (Signed)
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 PMH is also significant for COPD, diabetes, hypertension, and ongoing tobacco abuse. He was recently hospitalized 05/03/2018 to 05/05/2018 for altered mental status likely secondary to hypoglycemia.Patient was also treated for fever of unknown origin and acute on chronic anemia requiring blood transfusion. He was again hospitalized 08/05/18 for AMS and hyperglycemia. He is presumed to have an infection, although source is unclear. He was referred to the palliative care clinic for symptom management and further discussion of goals.   CODE STATUS: DNR  PAST MEDICAL HISTORY: Past Medical History:  Diagnosis Date  . Cancer ([place])   . COPD (chronic obstructive pulmonary disease) ([place])   . Diabetes mellitus without complication ([place])   . Hypertension   . Shortness of breath dyspnea     PAST SURGICAL HISTORY:  Past Surgical History:  Procedure Laterality Date  . COLON SURGERY     "twisted intestine"  . ERCP N/A 04/26/2018   Procedure: ENDOSCOPIC RETROGRADE CHOLANGIOPANCREATOGRAPHY (ERCP);  Surgeon: Lucilla Lame, MD;  Location: [facility] ENDOSCOPY;  Service: Endoscopy;  Laterality: N/A;  . HERNIA MESH REMOVAL Right   . INGUINAL HERNIA REPAIR Left 12/04/2014   Procedure: HERNIA REPAIR INGUINAL ADULT;  Surgeon: Rochel Brome, MD;  Location: [facility] ORS;  Service: General;  Laterality: Left;  . PORTA CATH INSERTION N/A 05/16/2018   Procedure: PORTA  CATH INSERTION;  Surgeon: Algernon Huxley, MD;  Location: [place] CV LAB;  Service: Cardiovascular;  Laterality: N/A;    HEMATOLOGY/ONCOLOGY HISTORY:    Malignant neoplasm of pancreas ([place])   05/13/2018 Initial Diagnosis    Malignant neoplasm of pancreas ([place])    05/17/2018 -  Chemotherapy    The patient had PACLitaxel-protein bound (ABRAXANE) chemo infusion 175 mg, 100 mg/m2 = 175 mg (100 % of original dose 100 mg/m2), Intravenous,  Once, 2 of 5 cycles Dose modification: 100 mg/m2 (original dose 100 mg/m2, Cycle 2) Administration: 175 mg (07/05/2018), 175 mg (07/19/2018), 175 mg (07/26/2018) gemcitabine (GEMZAR) 1,400 mg in sodium chloride 0.9 % 250 mL chemo infusion, 1,406 mg (100 % of original dose 800 mg/m2), Intravenous,  Once, 3 of 6 cycles Dose modification: 800 mg/m2 (original dose 800 mg/m2, Cycle 1, Reason: Other (see comments), Comment: abnormal LFTs) Administration: 1,400 mg (05/17/2018), 1,400 mg (05/24/2018), 1,400 mg (06/14/2018), 1,400 mg (06/28/2018), 1,400 mg (07/19/2018), 1,400 mg (07/05/2018), 1,400 mg (07/26/2018)  for chemotherapy treatment.      ALLERGIES:  has No Known Allergies.  MEDICATIONS:  Current Facility-Administered Medications  Medication Dose Route Frequency Provider Last Rate Last Dose  . acetaminophen (TYLENOL) tablet 650 mg  650 mg Oral Q6H PRN Hillary Bow, MD       Or  . acetaminophen (TYLENOL) suppository 650 mg  650 mg Rectal Q6H PRN Sudini, Srikar, MD      . glycopyrrolate (ROBINUL) tablet 1 mg  1 mg Oral Q4H PRN Sudini, Srikar, MD       Or  . glycopyrrolate (ROBINUL) injection 0.2 mg  0.2 mg Subcutaneous Q4H PRN Hillary Bow, MD  Or  . glycopyrrolate (ROBINUL) injection 0.2 mg  0.2 mg Intravenous Q4H PRN Sudini, Alveta Heimlich, MD      . haloperidol (HALDOL) tablet 0.5 mg  0.5 mg Oral Q4H PRN Sudini, Alveta Heimlich, MD       Or  . haloperidol (HALDOL) 2 MG/ML solution 0.5 mg  0.5 mg Sublingual Q4H PRN Sudini, Srikar, MD       Or  . haloperidol  lactate (HALDOL) injection 0.5 mg  0.5 mg Intravenous Q4H PRN Sudini, Srikar, MD      . LORazepam (ATIVAN) tablet 1 mg  1 mg Oral Q4H PRN Hillary Bow, MD       Or  . LORazepam (ATIVAN) 2 MG/ML concentrated solution 1 mg  1 mg Sublingual Q4H PRN Hillary Bow, MD       Or  . LORazepam (ATIVAN) injection 1 mg  1 mg Intravenous Q4H PRN Hillary Bow, MD   1 mg at 08/07/18 0136  . morphine 4 MG/ML injection 4 mg  4 mg Intravenous Q2H PRN Hillary Bow, MD   4 mg at 08/05/18 1632  . morphine CONCENTRATE 10 MG/0.5ML oral solution 5 mg  5 mg Oral Q2H PRN Sudini, Alveta Heimlich, MD       Or  . morphine CONCENTRATE 10 MG/0.5ML oral solution 5 mg  5 mg Sublingual Q2H PRN Sudini, Srikar, MD      . ondansetron (ZOFRAN-ODT) disintegrating tablet 4 mg  4 mg Oral Q6H PRN Sudini, Srikar, MD       Or  . ondansetron (ZOFRAN) injection 4 mg  4 mg Intravenous Q6H PRN Hillary Bow, MD       Facility-Administered Medications Ordered in Other Encounters  Medication Dose Route Frequency Provider Last Rate Last Dose  . sodium chloride flush (NS) 0.9 % injection 10 mL  10 mL Intracatheter PRN Sindy Guadeloupe, MD      . sodium chloride flush (NS) 0.9 % injection 10 mL  10 mL Intravenous PRN Sindy Guadeloupe, MD   10 mL at 06/28/18 1246    VITAL SIGNS: BP (!) 155/89 (BP Location: Left Arm)   Pulse 88   Temp 97.6 F (36.4 C) (Oral)   Resp 16   Ht 6' (1.829 m)   Wt 140 lb 8 oz (63.7 kg)   SpO2 98%   BMI 19.06 kg/m  Filed Weights   08/04/18 2102  Weight: 140 lb 8 oz (63.7 kg)    Estimated body mass index is 19.06 kg/m as calculated from the following:   Height as of this encounter: 6' (1.829 m).   Weight as of this encounter: 140 lb 8 oz (63.7 kg).  LABS: CBC:    Component Value Date/Time   WBC 9.8 08/05/2018 0503   HGB 9.9 (L) 08/05/2018 0503   HGB 10.9 (L) 10/27/2012 0427   HCT 30.3 (L) 08/05/2018 0503   HCT 31.9 (L) 10/27/2012 0427   PLT 327 08/05/2018 0503   PLT 191 10/27/2012 0427   MCV 95.3  08/05/2018 0503   MCV 99 10/27/2012 0427   NEUTROABS 6.4 08/04/2018 2025   NEUTROABS 5.1 10/27/2012 0427   LYMPHSABS 2.1 08/04/2018 2025   LYMPHSABS 1.2 10/27/2012 0427   MONOABS 2.1 (H) 08/04/2018 2025   MONOABS 1.0 10/27/2012 0427   EOSABS 0.0 08/04/2018 2025   EOSABS 0.1 10/27/2012 0427   BASOSABS 0.1 08/04/2018 2025   BASOSABS 0.0 10/27/2012 0427   Comprehensive Metabolic Panel:    Component Value Date/Time   NA 131 (L) 08/05/2018  0503   NA 131 (L) 10/27/2012 0427   K 3.0 (L) 08/05/2018 0503   K 3.1 (L) 10/27/2012 0427   CL 93 (L) 08/05/2018 0503   CL 94 (L) 10/27/2012 0427   CO2 29 08/05/2018 0503   CO2 29 10/27/2012 0427   BUN 10 08/05/2018 0503   BUN 9 10/27/2012 0427   CREATININE 0.48 (L) 08/05/2018 0503   CREATININE 0.74 10/27/2012 0427   GLUCOSE 328 (H) 08/05/2018 0503   GLUCOSE 157 (H) 10/27/2012 0427   CALCIUM 8.3 (L) 08/05/2018 0503   CALCIUM 8.0 (L) 10/27/2012 0427   AST 25 08/04/2018 2025   AST 134 (H) 10/22/2012 0501   ALT 24 08/04/2018 2025   ALT 202 (H) 10/22/2012 0501   ALKPHOS 174 (H) 08/04/2018 2025   ALKPHOS 65 10/22/2012 0501   BILITOT 0.6 08/04/2018 2025   BILITOT 1.5 (H) 10/22/2012 0501   PROT 7.4 08/04/2018 2025   PROT 6.7 10/22/2012 0501   ALBUMIN 3.3 (L) 08/04/2018 2025   ALBUMIN 3.5 10/22/2012 0501    RADIOGRAPHIC STUDIES: Dg Chest 2 View  Result Date: 07/19/2018 CLINICAL DATA:  Cough, congestion, wheezing EXAM: CHEST - 2 VIEW COMPARISON:  06/06/2018 FINDINGS: Pain right Port-A-Cath remains in place, unchanged. There is hyperinflation of the lungs compatible with COPD. Right pulmonary nodule again noted, stable. No confluent airspace opacities or effusions. No acute bony abnormality. IMPRESSION: COPD.  Stable right mid lung pulmonary nodule.  No active disease. Electronically Signed   By: Rolm Baptise M.D.   On: 07/19/2018 13:15   Ct Head Wo Contrast  Result Date: 08/04/2018 CLINICAL DATA:  Altered mental status. Elevated glucose.  EXAM: CT HEAD WITHOUT CONTRAST TECHNIQUE: Contiguous axial images were obtained from the base of the skull through the vertex without intravenous contrast. COMPARISON:  MRI brain 06/06/2018. CT head 06/06/2018. FINDINGS: Brain: Diffuse cerebral atrophy. Low-attenuation changes in the deep white matter consistent small vessel ischemia. No mass-effect or midline shift. No abnormal extra-axial fluid collections. Gray-white matter junctions are distinct. Basal cisterns are not effaced. No acute intracranial hemorrhage. No significant changes since previous study. Vascular: Mild intracranial arterial vascular calcifications. Skull: Calvarium appears intact. Sinuses/Orbits: Mucosal thickening in the paranasal sinuses. No acute air-fluid levels. Mastoid air cells are clear. Other: Mild nasal bone deformities, probably old. IMPRESSION: No acute intracranial abnormalities. Chronic atrophy and small vessel ischemic changes. Electronically Signed   By: Lucienne Capers M.D.   On: 08/04/2018 23:42   Ct Abdomen Pelvis W Contrast  Result Date: 08/04/2018 CLINICAL DATA:  Abdominal pain, hyperglycemia. Stage IV pancreatic adenocarcinoma. EXAM: CT ABDOMEN AND PELVIS WITH CONTRAST TECHNIQUE: Multidetector CT imaging of the abdomen and pelvis was performed using the standard protocol following bolus administration of intravenous contrast. CONTRAST:  152mL ISOVUE-300 IOPAMIDOL (ISOVUE-300) INJECTION 61% COMPARISON:  04/25/2018 FINDINGS: Motion degraded images. Lower chest: Lung bases are clear. Hepatobiliary: No focal hepatic lesions. Gallbladder is unremarkable. Moderate intrahepatic and extrahepatic ductal dilatation. Indwelling common duct stent. Pancreas: 3.2 x 4.0 cm mass in the pancreatic head (series 2/image 34), increased, corresponding to the patient's known pancreatic adenocarcinoma. Associated atrophy of the pancreatic body/tail with associated ductal dilatation (series 2/image 28). Suspected adjacent necrotic  peripancreatic adenopathy, measuring up to 1.4 cm (series 2/image 28), poorly evaluated. Spleen: Within normal limits. Adrenals/Urinary Tract: 1.7 cm left adrenal nodule (series 2/image 25), previously 2.7 cm. Right adrenal gland is within normal limits. Subcentimeter renal cysts bilaterally.  No hydronephrosis. Bladder is mildly thick-walled although underdistended. Stomach/Bowel: Stomach is within normal  limits. No evidence of bowel obstruction. Appendix is not discretely visualized. Mild to moderate rectal stool burden with mild fecal impaction. Vascular/Lymphatic: No evidence of abdominal aortic aneurysm. Atherosclerotic calcifications of the abdominal aorta and branch vessels. Small para-aortic nodes, including an 8 mm short axis left para-aortic node (series 2/image 33), mildly increased. Reproductive: Prostate is grossly unremarkable. Other: No abdominopelvic ascites. No frank peritoneal nodularity. Prior right lateral ventral hernia mesh repair. Musculoskeletal: Grade 1 spondylolisthesis at L5-S1. Very mild degenerative changes of the lower thoracic spine. IMPRESSION: 4.0 cm mass in the pancreatic head, corresponding to the patient's known pancreatic adenocarcinoma, mildly increased. Suspected 1.4 cm short axis necrotic peripancreatic node. Small para-aortic nodes measuring up to 8 mm short axis. 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 Moderate intrahepatic and extrahepatic ductal dilatation. Indwelling common duct stent. Electronically Signed   By: Julian Hy M.D.   On: 08/04/2018 23:50   Dg Chest Port 1 View  Result Date: 08/04/2018 CLINICAL DATA:  Altered mental status EXAM: PORTABLE CHEST 1 VIEW COMPARISON:  07/19/2018 FINDINGS: Power port type central venous catheter with tip over the low SVC region. No pneumothorax. Probable emphysematous changes in the lungs. No focal consolidation. 1 cm nodular opacity in the right mid lung is unchanged since previous study. Prominence of the right  hilum could represent lymphadenopathy. This is increased. Left lung is clear. No blunting of costophrenic angles. Calcification of the aorta. Thoracic scoliosis convex towards the right. IMPRESSION: 1. Emphysematous changes in the lungs. No evidence of active pulmonary disease. 2. Stable 1 cm nodular opacity in the right mid lung. 3. Increasing prominence of right hilum may indicate lymphadenopathy. Electronically Signed   By: Lucienne Capers M.D.   On: 08/04/2018 22:30    PERFORMANCE STATUS (ECOG) : 4 - Bedbound  Review of Systems As noted above. Otherwise, a complete review of systems is negative.  Physical Exam General: Frail appearing, thin Cardiovascular: regular rate and rhythm Pulmonary: clear ant fields Abdomen: soft, nontender, + bowel sounds Extremities: no edema, no joint deformities Skin: no rashes Neurological: alert, answers questions, confused  IMPRESSION: Patient remains alert and comfortable appearing. He denies any acute changes or complaints. Patient is pending discharge home today with hospice. Family at bedside confirm this plan.   Case discussed with hospice liaison.  PLAN: Home with hospice   Time Total: 15 minutes  Visit consisted of counseling and education dealing with the complex and emotionally intense issues of symptom management and palliative care in the setting of serious and potentially life-threatening illness.Greater than 50%  of this time was spent counseling and coordinating care related to the above assessment and plan.  Signed by: Altha Harm, PhD, NP-C [phone] (Work Cell)

## 2018-08-07 NOTE — Progress Notes (Signed)
Notified by [facility] that patient's family has decided to take him home and not transfer to the hospice home. Writer spoke via telephone to patient's daughter Colletta [place] to confirm DME needed. Plan is for family to transport patient via car. Updated notes to be faxed to referral. Hospital care team updated. Thank you. Flo Shanks RN, BSN, [place] [facility], hospital Liaison [phone]

## 2018-08-07 NOTE — Care Management (Signed)
Received telephone call from Colletta [place] (daughter). Family will take home today. Flo Shanks, RN [facility] representative. Equipment needs to be ordered. Will transport per private car. Shelbie Ammons RN MSN CCM Care Management [phone]

## 2018-08-09 ENCOUNTER — Inpatient Hospital Stay: Payer: Medicare Other | Admitting: Oncology

## 2018-08-09 ENCOUNTER — Inpatient Hospital Stay: Payer: Medicare Other

## 2018-08-09 ENCOUNTER — Inpatient Hospital Stay: Payer: Medicare Other | Admitting: Hospice and Palliative Medicine

## 2018-08-10 ENCOUNTER — Other Ambulatory Visit: Payer: Self-pay | Admitting: Oncology

## 2018-08-10 LAB — CULTURE, BLOOD (ROUTINE X 2)
Culture: NO GROWTH
Culture: NO GROWTH
Special Requests: ADEQUATE
Special Requests: ADEQUATE

## 2018-08-12 ENCOUNTER — Other Ambulatory Visit: Payer: Self-pay | Admitting: Oncology

## 2018-08-24 DEATH — deceased

## 2018-08-26 NOTE — Discharge Summary (Signed)
[facility] [place] NAME: Bryan Hicks    MR#:  [id number]  DATE OF BIRTH:  1940/12/31  DATE OF ADMISSION:  08/04/2018 ADMITTING PHYSICIAN: Lance Coon, MD  DATE OF DISCHARGE: 08/07/2018  1:41 PM  PRIMARY CARE PHYSICIAN: Kirk Ruths, MD   ADMISSION DIAGNOSIS:  Metastatic cancer ([place]) [C79.9] Encephalopathy [G93.40] HHNC (hyperglycemic hyperosmolar nonketotic coma) ([place]) [E11.01] AMS (altered mental status) [R41.82]  DISCHARGE DIAGNOSIS:  Principal Problem:   Acute encephalopathy Active Problems:   COPD (chronic obstructive pulmonary disease) ([facility])   HTN (hypertension), benign   Hyperlipidemia associated with type 2 diabetes mellitus ([facility])   Type 2 diabetes mellitus with stage 1 chronic kidney disease, without long-term current use of insulin ([place])   Malignant neoplasm of pancreas ([place])   Hyperglycemia   Sepsis ([place])   Palliative care encounter   SECONDARY DIAGNOSIS:   Past Medical History:  Diagnosis Date  . Cancer ([place])   . COPD (chronic obstructive pulmonary disease) ([place])   . Diabetes mellitus without complication ([place])   . Hypertension   . Shortness of breath dyspnea      ADMITTING HISTORY  HISTORY OF PRESENT ILLNESS:  Bryan Hicks  is a 78 y.o. male who presents with chief complaint as above.  Patient presents to the ED with altered mental status.  Per family's report he has not been taking his insulin.  He has a history of metastatic pancreatic cancer.  He is significantly hyperglycemic on evaluation today.  He required multiple doses of Haldol and Ativan due to agitation.  He is unable to provide any more information to his history.  Hospitalist were called for admission  HOSPITAL COURSE:   *Severe sepsis *Acute metabolic encephalopathy likely due to sepsis. *Diabetes mellitus uncontrolled withHyperglycemia *COPD *HTN *Hyperlipidemia *Malignant neoplasm of pancreas  Patient was admitted  to medical floor with telemetry monitoring.  Started on broad-spectrum antibiotics along with IV fluid resuscitation for severe sepsis.  Etiology could not be found.  After discussing with his family they decided to transition patient to comfort measures due to his worsening pancreatic cancer and functional status.  Palliative care was consulted.  Hospice services were contacted.  Patient was discharged home with hospice services for end-of-life care.  CONSULTS OBTAINED:    DRUG ALLERGIES:  No Known Allergies  DISCHARGE MEDICATIONS:   Allergies as of 08/07/2018   No Known Allergies     Medication List    TAKE these medications   docusate sodium 100 MG capsule Commonly known as:  COLACE Take 1 capsule (100 mg total) by mouth daily as needed for mild constipation.   Fluticasone-Salmeterol 250-50 MCG/DOSE Aepb Commonly known as:  ADVAIR Inhale 1 puff into the lungs 2 (two) times daily.   insulin aspart 100 UNIT/ML injection Commonly known as:  novoLOG 5 units subcutaneous prior to meals if sugars greater than 150   LANTUS SOLOSTAR 100 UNIT/ML Solostar Pen Generic drug:  Insulin Glargine 6 units suncutanous injection daily   lidocaine-prilocaine cream Commonly known as:  EMLA Apply to affected area once   LORazepam 1 MG tablet Commonly known as:  ATIVAN Take 1 tablet (1 mg total) by mouth every 8 (eight) hours as needed for anxiety or sleep. What changed:  Another medication with the same name was added. Make sure you understand how and when to take each.   LORazepam 1 MG tablet Commonly known as:  ATIVAN Take 1 tablet (1 mg total) by mouth every 4 (four)  hours as needed for anxiety. What changed:  You were already taking a medication with the same name, and this prescription was added. Make sure you understand how and when to take each.   Melatonin 5 MG Tabs Take 1 tablet (5 mg total) by mouth at bedtime.   morphine CONCENTRATE 10 MG/0.5ML Soln concentrated  solution Take 0.25 mLs (5 mg total) by mouth every 2 (two) hours as needed for moderate pain, severe pain or shortness of breath (or dyspnea).   ondansetron 8 MG tablet Commonly known as:  ZOFRAN Take 1 tablet (8 mg total) by mouth 2 (two) times daily as needed (Nausea or vomiting).   potassium chloride 20 MEQ/15ML (10%) Soln TAKE 15 MLS BY MOUTH EVERY DAY FOR 4 DAYS   potassium chloride SA 20 MEQ tablet Commonly known as:  K-DUR,KLOR-CON Take 1 tablet (20 mEq total) by mouth daily.   prochlorperazine 10 MG tablet Commonly known as:  COMPAZINE TAKE 1 TABLET(10 MG) BY MOUTH EVERY 6 HOURS AS NEEDED FOR NAUSEA OR VOMITING What changed:  See the new instructions.   traZODone 100 MG tablet Commonly known as:  DESYREL Take 1 tablet (100 mg total) by mouth at bedtime.   vitamin B-12 1000 MCG tablet Commonly known as:  CYANOCOBALAMIN Take 1,000 mcg by mouth daily.     ASK your doctor about these medications   metFORMIN 500 MG 24 hr tablet Commonly known as:  GLUCOPHAGE-XR Take 2,000 mg by mouth daily before supper.       Today   VITAL SIGNS:  Blood pressure (!) 155/89, pulse 88, temperature 97.6 F (36.4 C), temperature source Oral, resp. rate 16, height 6' (1.829 m), weight 63.7 kg, SpO2 98 %.  I/O:  No intake or output data in the 24 hours ending 08/26/18 1019  PHYSICAL EXAMINATION:  Physical Exam  GENERAL:  78 y.o.-year-old patient lying in the bed with no acute distress.  LUNGS: Normal breath sounds bilaterally, no wheezing, rales,rhonchi or crepitation. No use of accessory muscles of respiration.  CARDIOVASCULAR: S1, S2 normal. No murmurs, rubs, or gallops.  ABDOMEN: Soft, non-tender, non-distended. Bowel sounds present. No organomegaly or mass.  NEUROLOGIC: Moves all 4 extremities. PSYCHIATRIC: The patient is alert and awake.  DATA REVIEW:   CBC No results for input(s): WBC, HGB, HCT, PLT in the last 168 hours.  Chemistries  No results for input(s): NA, K,  CL, CO2, GLUCOSE, BUN, CREATININE, CALCIUM, MG, AST, ALT, ALKPHOS, BILITOT in the last 168 hours.  Invalid input(s): GFRCGP  Cardiac Enzymes No results for input(s): TROPONINI in the last 168 hours.  Microbiology Results  Results for orders placed or performed during the hospital encounter of 08/04/18  CULTURE, BLOOD (ROUTINE X 2) w Reflex to ID Panel     Status: None   Collection Time: 08/05/18  8:15 AM  Result Value Ref Range Status   Specimen Description BLOOD RIGHT ARM  Final   Special Requests   Final    BOTTLES DRAWN AEROBIC AND ANAEROBIC Blood Culture adequate volume   Culture   Final    NO GROWTH 5 DAYS Performed at [facility], [address] [place], [place] [address]    Report Status 08/10/2018 FINAL  Final  CULTURE, BLOOD (ROUTINE X 2) w Reflex to ID Panel     Status: None   Collection Time: 08/05/18  8:21 AM  Result Value Ref Range Status   Specimen Description BLOOD LEFT HAND  Final   Special Requests   Final  BOTTLES DRAWN AEROBIC AND ANAEROBIC Blood Culture adequate volume   Culture   Final    NO GROWTH 5 DAYS Performed at [facility], [address] [place], [place] [address]    Report Status 08/10/2018 FINAL  Final    RADIOLOGY:  No results found.  Follow up with PCP in 1 week.  Management plans discussed with the patient, family and they are in agreement.  CODE STATUS:  Code Status History    Date Active Date Inactive Code Status Order ID Comments User Context   08/05/2018 1227 08/07/2018 1646 DNR [id number]  Hillary Bow, MD Inpatient   08/05/2018 0058 08/05/2018 1227 DNR [id number]  Lance Coon, MD ED   05/04/2018 1330 05/05/2018 1804 DNR [id number]  Loletha Grayer, MD Inpatient   05/04/2018 0101 05/04/2018 1330 Full Code [id number]  Amelia Jo, MD Inpatient   04/26/2018 0336 04/27/2018 1825 Full Code [id number]  Lance Coon, MD Inpatient    Questions for Most Recent Historical Code Status (Order [id number])    Question  Answer Comment   In the event of cardiac or respiratory ARREST Do not call a "code blue"    In the event of cardiac or respiratory ARREST Do not perform Intubation, CPR, defibrillation or ACLS    In the event of cardiac or respiratory ARREST Use medication by any route, position, wound care, and other measures to relive pain and suffering. May use oxygen, suction and manual treatment of airway obstruction as needed for comfort.    Comments nurse may pronounce         Advance Directive Documentation     Most Recent Value  Type of Advance Directive  Healthcare Power of Attorney, Living will  Pre-existing out of facility DNR order (yellow form or pink MOST form)  -  "MOST" Form in Place?  -      TOTAL TIME TAKING CARE OF THIS PATIENT ON DAY OF DISCHARGE: more than 30 minutes.   Leia Alf Marty Uy M.D on 08/26/2018 at 10:19 AM  Between 7am to 6pm - Pager - [phone]  After 6pm go to [email or web address] - password [email or web address] [facility]  Office  [phone]  CC: Primary care physician; Kirk Ruths, MD  Note: This dictation was prepared with Dragon dictation along with smaller phrase technology. Any transcriptional errors that result from this process are unintentional.

## 2018-08-27 ENCOUNTER — Other Ambulatory Visit: Payer: Self-pay | Admitting: Oncology

## 2019-09-28 IMAGING — MR MR HEAD WO/W CM
13 series · 48 of 48 positions shown · IV contrast ([facility])
Comparison: CT head 06/06/2018

CLINICAL DATA: Altered level of consciousness. History of
pancreatic cancer.

EXAM:
MRI HEAD WITHOUT AND WITH CONTRAST
TECHNIQUE: Multiplanar, multiecho pulse sequences of the brain and surrounding
structures were obtained without and with intravenous contrast.
CONTRAST:  5.5 mL [facility]ist IV

[Series 5: ax dwi_tracew · axial · 3.0mm · 0.83mm/px · z∈[-66,+96]mm · 3 of 55 slices shown]
[im 1/55]
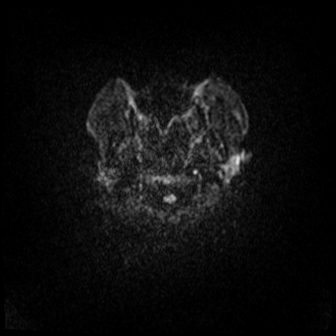
[im 28/55]
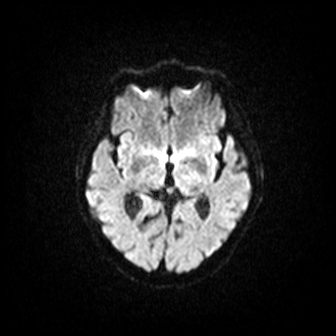
[im 55/55]
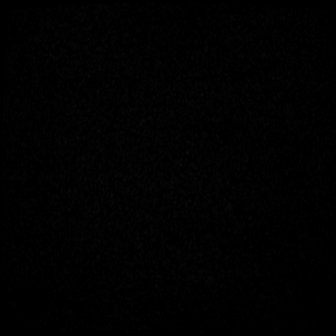

[Series 6: ax dwi_adc · axial · 3.0mm · 0.83mm/px · z∈[-66,+93]mm · 3 of 54 slices shown]
[im 1/54]
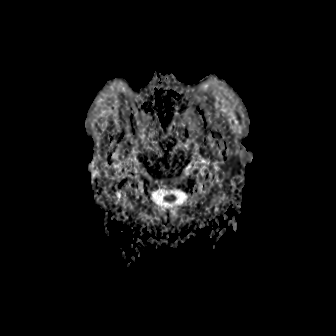
[im 27/54]
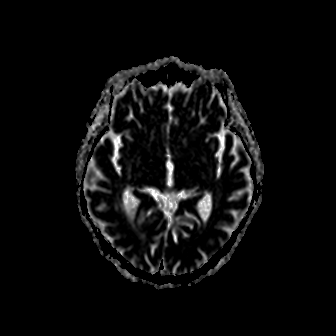
[im 54/54]
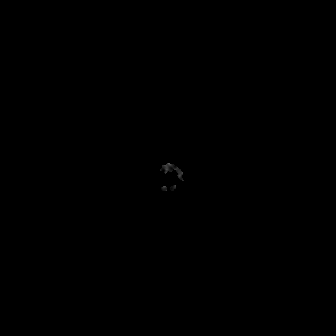

[Series 7: cor dwi_tracew · coronal · 5.0mm · 0.68mm/px · 2 of 37 slices shown]
[im 1/37]
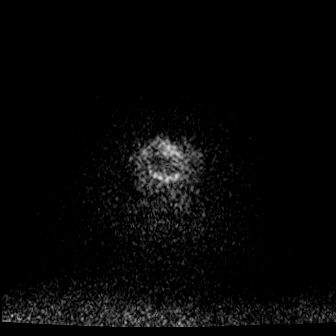
[im 37/37]
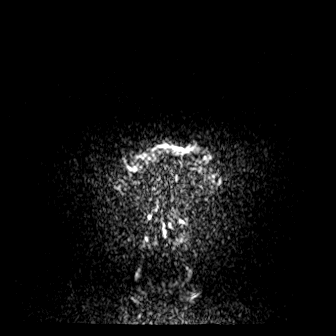

[Series 8: cor dwi_adc · coronal · 5.0mm · 0.68mm/px · 2 of 37 slices shown]
[im 1/37]
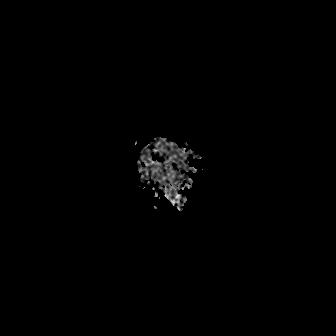
[im 37/37]
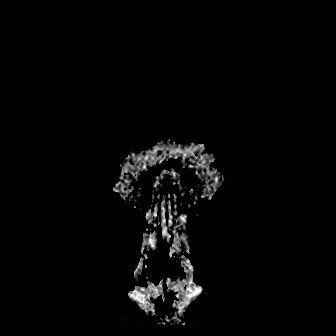

[Series 9: T1 · sagittal · 5.0mm · 0.94mm/px · 2 of 23 slices shown (1 of 2)]
[im 1/23]
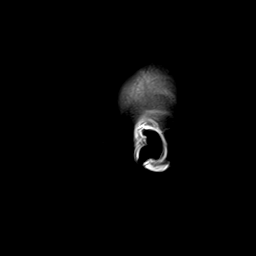
[im 23/23]
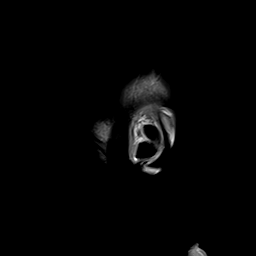

[Series 10: T2 · axial · 5.0mm · 0.53mm/px · z∈[-54,+90]mm · 2 of 25 slices shown]
[im 1/25]
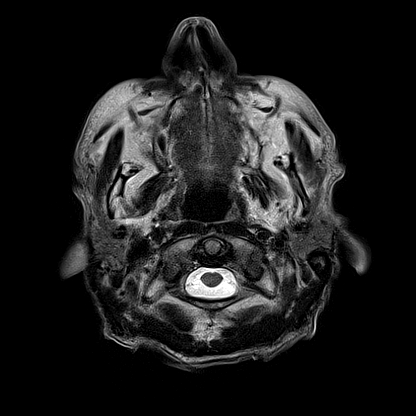
[im 25/25]
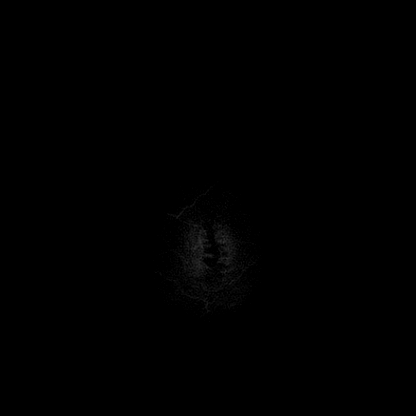

[Series 11: FLAIR · axial · 3.0mm · 0.53mm/px · z∈[-63,+99]mm · 4 of 55 slices shown]
[im 1/55]
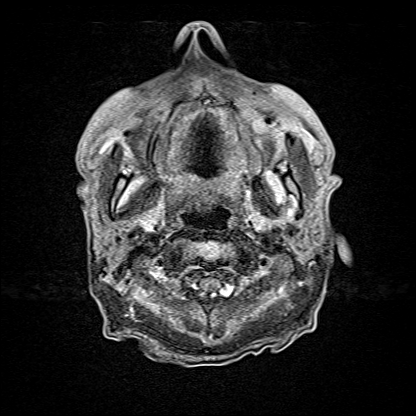
[im 19/55]
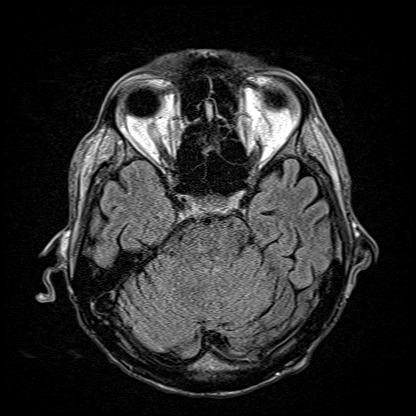
[im 37/55]
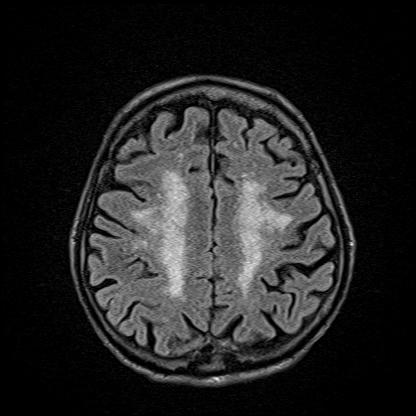
[im 55/55]
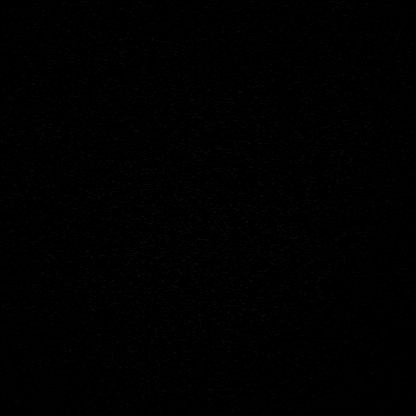

[Series 12: T2-star · axial · 5.0mm · 0.45mm/px · z∈[-53,+91]mm · 2 of 25 slices shown]
[im 1/25]
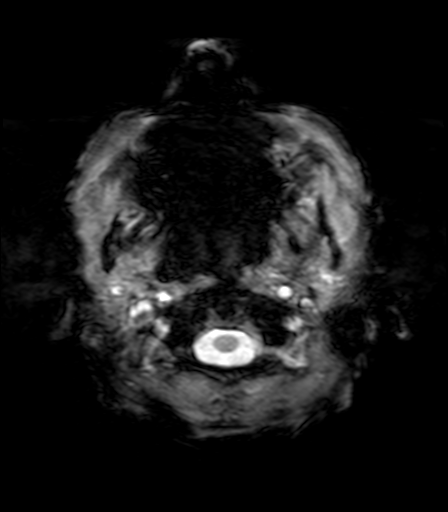
[im 25/25]
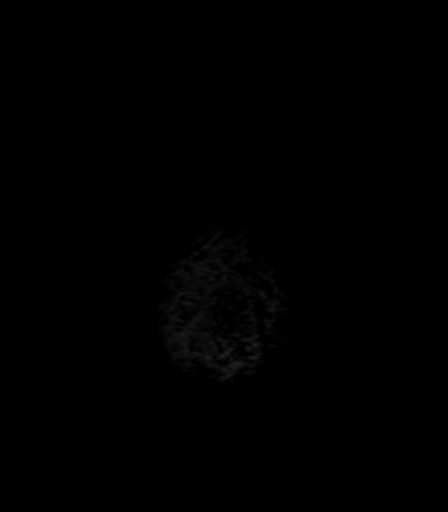

[Series 13: T1 · axial · 1.0mm · 0.98mm/px · z∈[-66,+105]mm · 11 of 169 slices shown (2 of 2)]
[im 1/169]
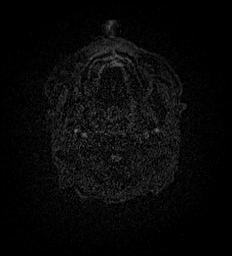
[im 17/169]
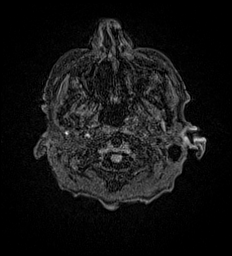
[im 34/169]
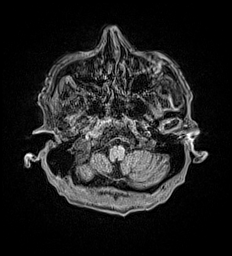
[im 51/169]
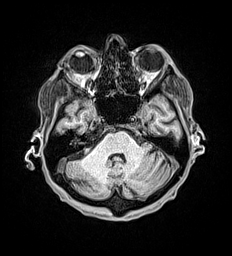
[im 68/169]
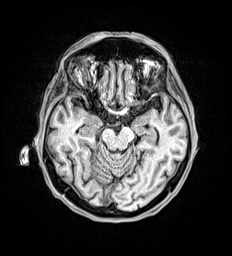
[im 85/169]
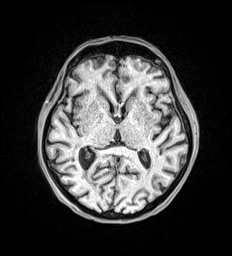
[im 101/169]
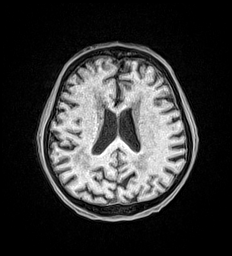
[im 118/169]
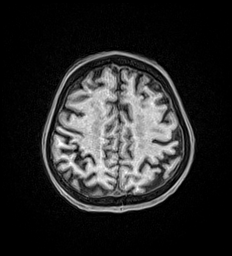
[im 135/169]
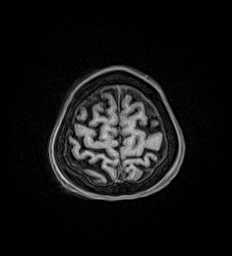
[im 152/169]
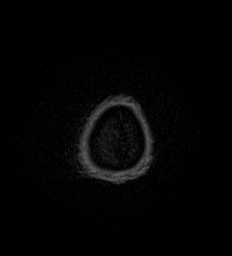
[im 169/169]
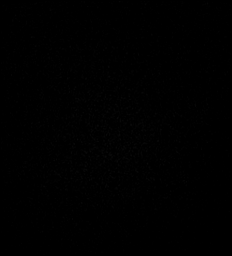

[Series 14: T2 post-contrast · coronal · 5.0mm · 0.57mm/px · 2 of 29 slices shown]
[im 1/29]
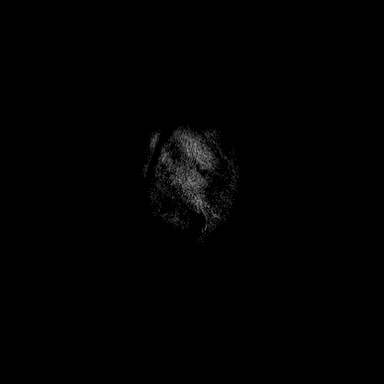
[im 29/29]
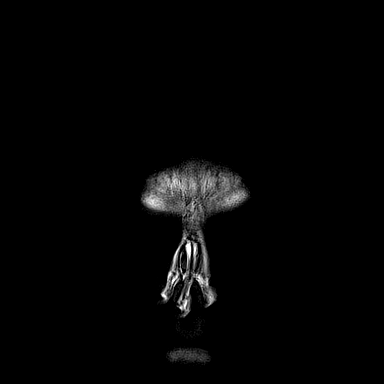

[Series 15: T1 post-contrast · axial · 1.0mm · 0.98mm/px · z∈[-66,+109]mm · 11 of 169 slices shown (1 of 3)]
[im 1/169]
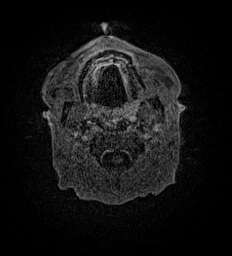
[im 17/169]
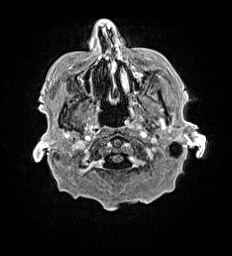
[im 34/169]
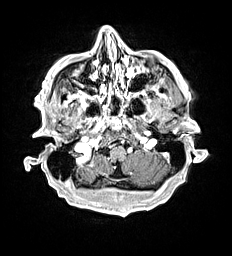
[im 51/169]
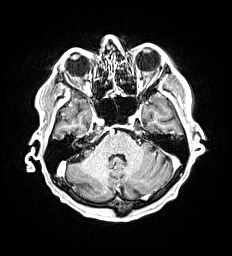
[im 68/169]
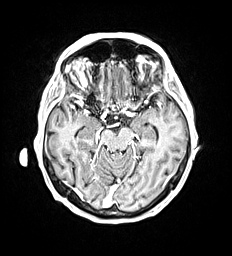
[im 85/169]
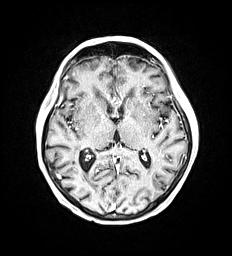
[im 101/169]
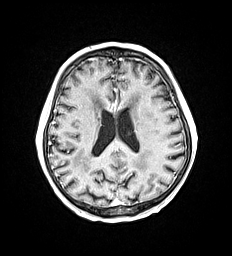
[im 118/169]
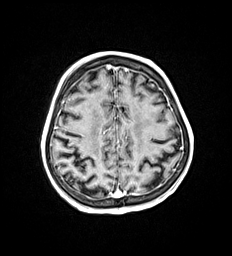
[im 135/169]
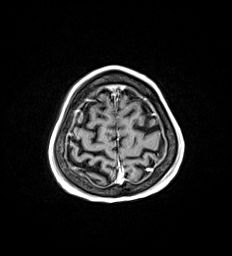
[im 152/169]
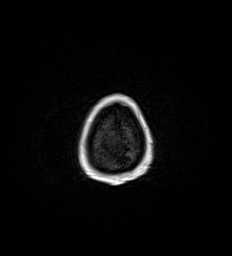
[im 169/169]
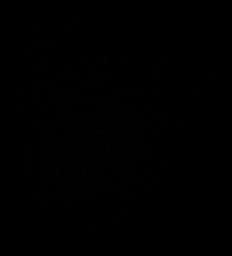

[Series 16: T1 post-contrast · coronal · 5.0mm · 0.57mm/px · 2 of 29 slices shown (2 of 3)]
[im 1/29]
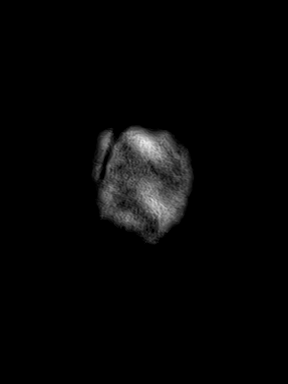
[im 29/29]
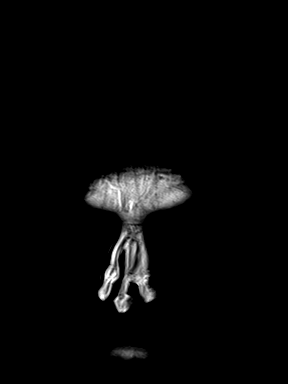

[Series 17: T1 post-contrast · sagittal · 5.0mm · 0.94mm/px · 2 of 23 slices shown (3 of 3)]
[im 1/23]
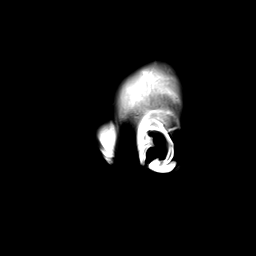
[im 23/23]
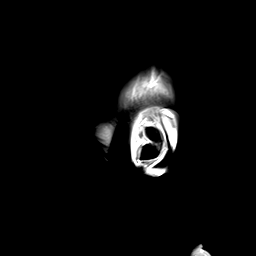

[48 of 48 positions shown; findings below may reference images not displayed]

FINDINGS: Brain: Moderate atrophy.  Moderate chronic white matter changes.

Negative for acute infarct.  Negative for hemorrhage or mass.

Normal enhancement.  Negative for metastatic disease.

Vascular: Normal arterial flow voids

Skull and upper cervical spine: Negative

Sinuses/Orbits: Mild mucosal edema paranasal sinuses.  Normal orbit

Other: None
IMPRESSION: No acute intracranial abnormality. Atrophy and chronic microvascular
ischemic changes in the white matter.

## 2019-09-28 IMAGING — DX DG CHEST 1V PORT
1 series · 1 of 1 positions shown · non-contrast
Comparison: 05/03/2018

CLINICAL DATA: Altered mental status. History of pancreatic cancer.

EXAM:
PORTABLE CHEST 1 VIEW

[chest ap]
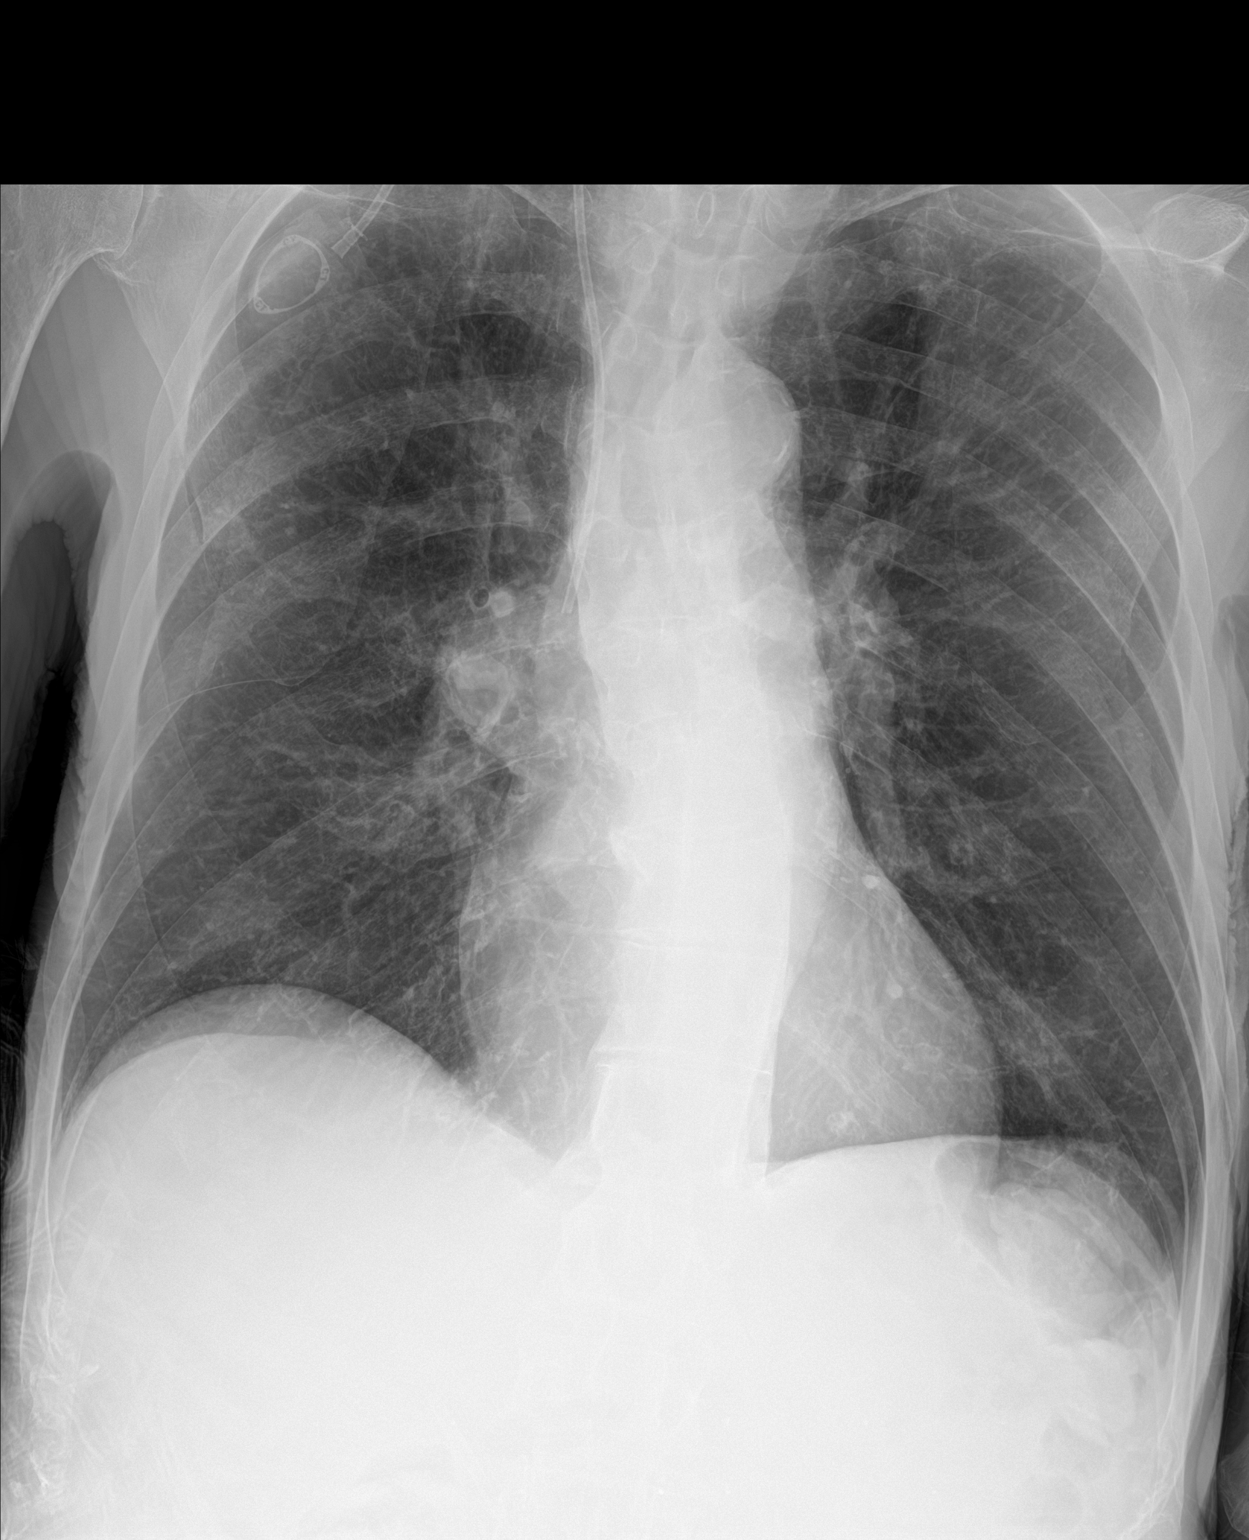

[1 of 1 positions shown; findings below may reference images not displayed]

FINDINGS: Right jugular Port-A-Cath is present. Catheter tip in the SVC
region. Negative for pneumothorax. Coarse lung markings with
increased lucency. Findings are compatible with emphysema. Again
noted is a pulmonary nodule in the lateral right chest. Heart and
mediastinum are within normal limits. Atherosclerotic calcifications
at the aortic arch.
IMPRESSION: No acute cardiopulmonary disease.

Emphysematous changes and re-demonstration of a right pulmonary
nodule.

Port-A-Cath position as described.

## 2019-09-28 IMAGING — CT CT HEAD W/O CM
3 series · 15 of 46 positions shown, 18 images · non-contrast
Comparison: None.

CLINICAL DATA: Hyperglycemia. Pancreatic cancer. Chemotherapy 2
weeks ago. Confusion.

EXAM:
CT HEAD WITHOUT CONTRAST
TECHNIQUE: Contiguous axial images were obtained from the base of the skull
through the vertex without intravenous contrast.

[Series 2: head wo · axial · 0.40mm/px · z∈[+336,+456]mm · 9 of 29 slices shown, 12 images]
[im 3/29  brain]
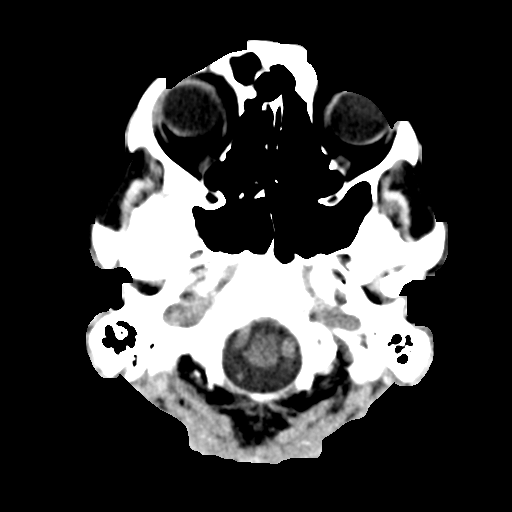
[im 3/29  bone]
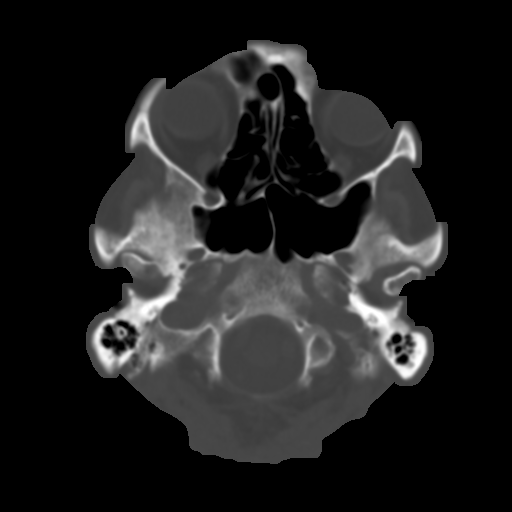
[im 6/29  brain]
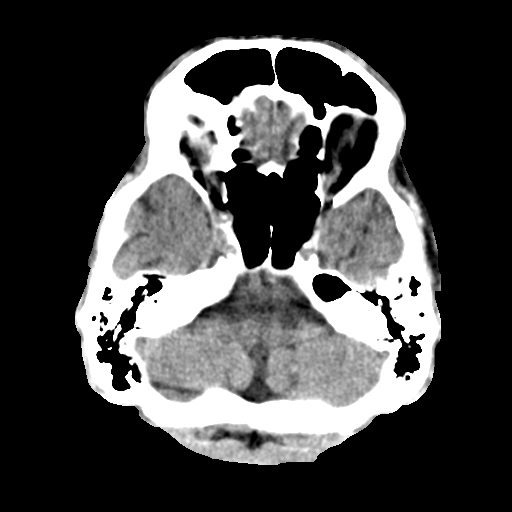
[im 9/29  brain]
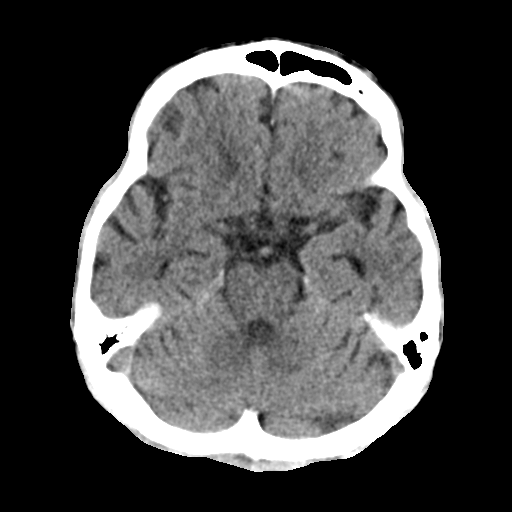
[im 12/29  brain]
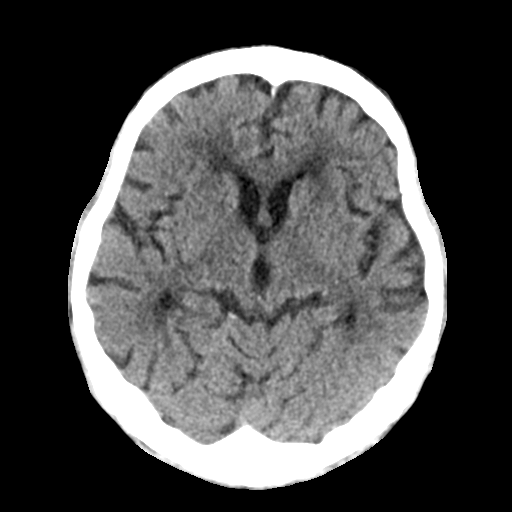
[im 15/29  brain]
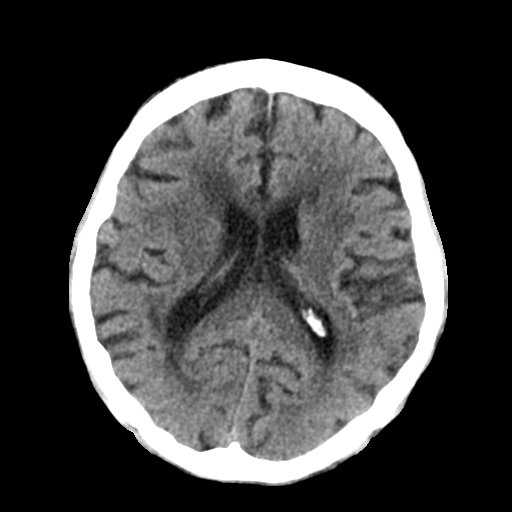
[im 15/29  bone]
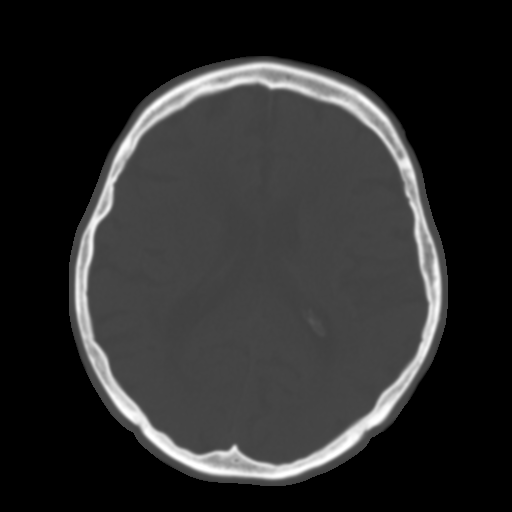
[im 18/29  brain]
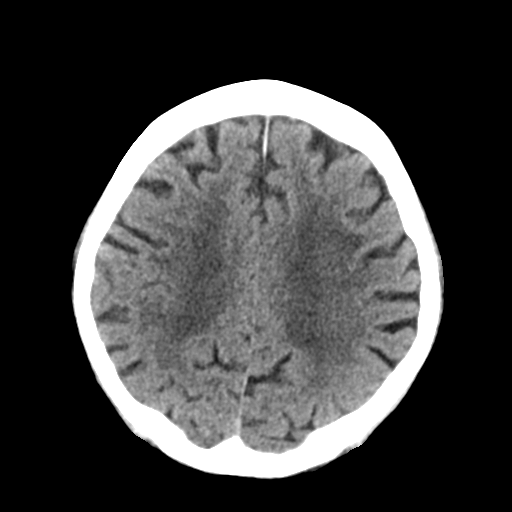
[im 21/29  brain]
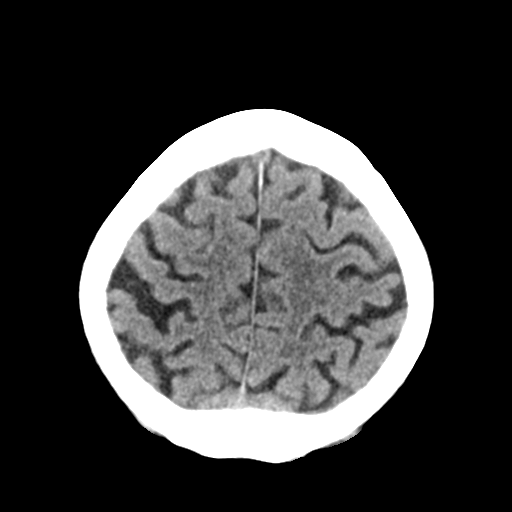
[im 24/29  brain]
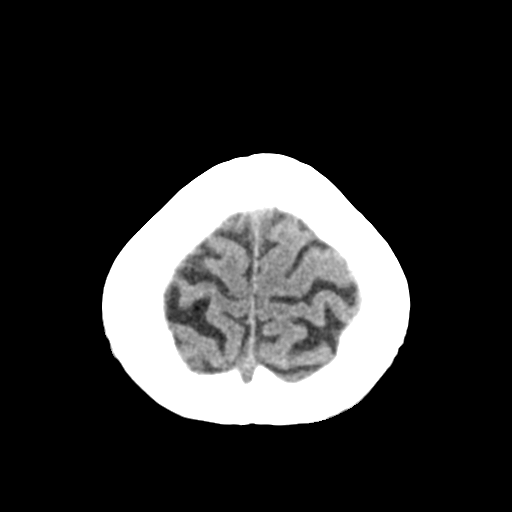
[im 27/29  brain]
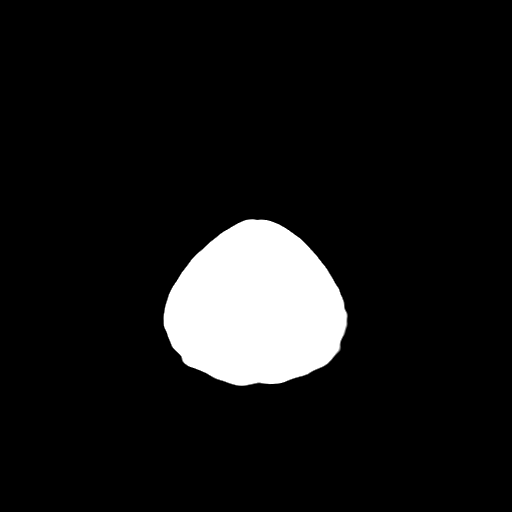
[im 27/29  bone]
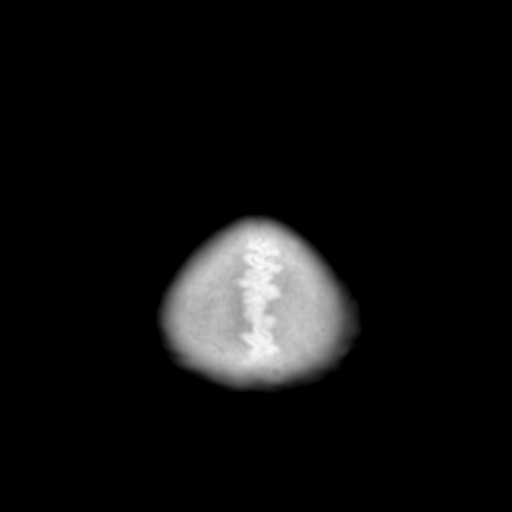

[Series 4: coronal soft tissue · coronal · 0.31mm/px · 3 of 63 slices shown]
[im 21/63  brain]
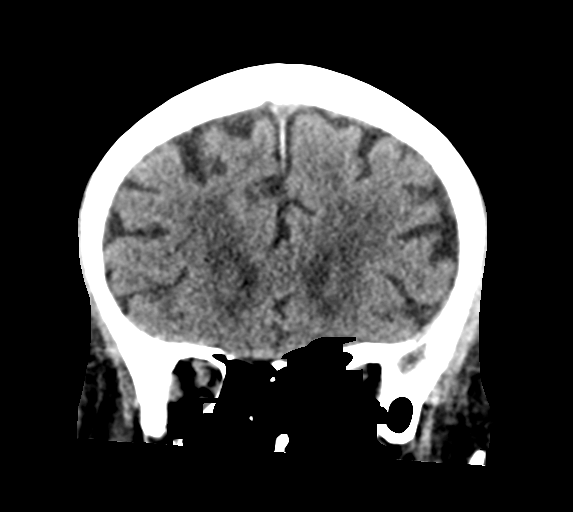
[im 28/63  brain]
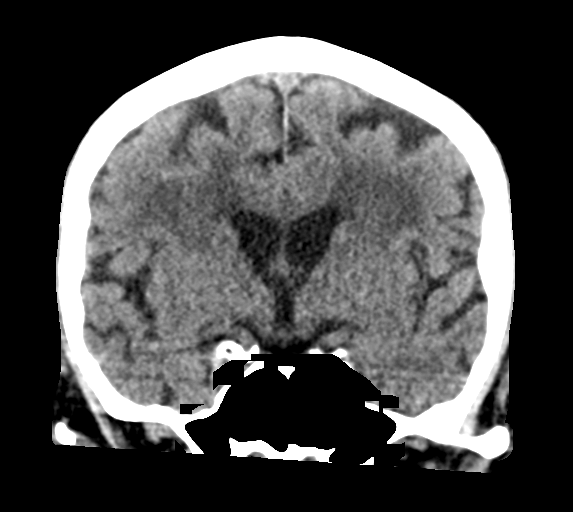
[im 35/63  brain]
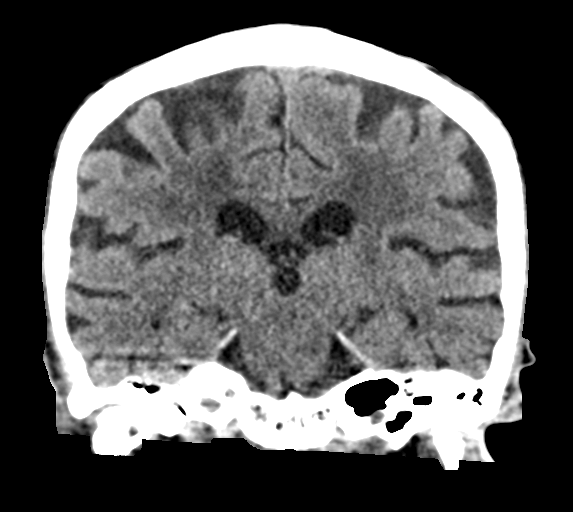

[Series 5: sagittal soft tissue · sagittal · 0.32mm/px · 3 of 53 slices shown]
[im 18/53  brain]
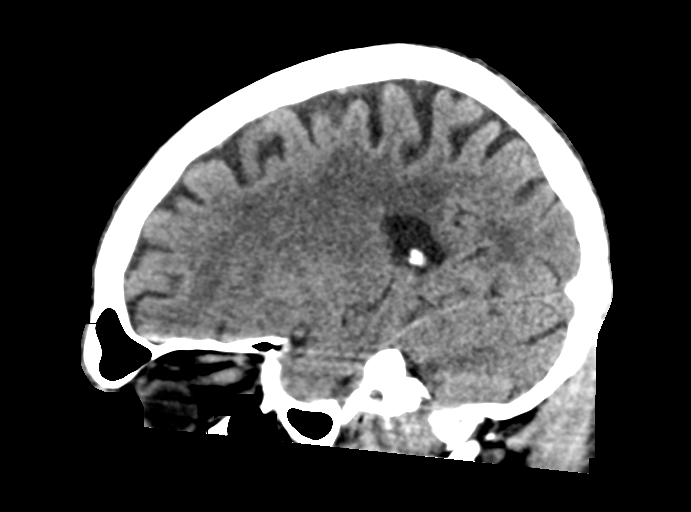
[im 27/53  brain]
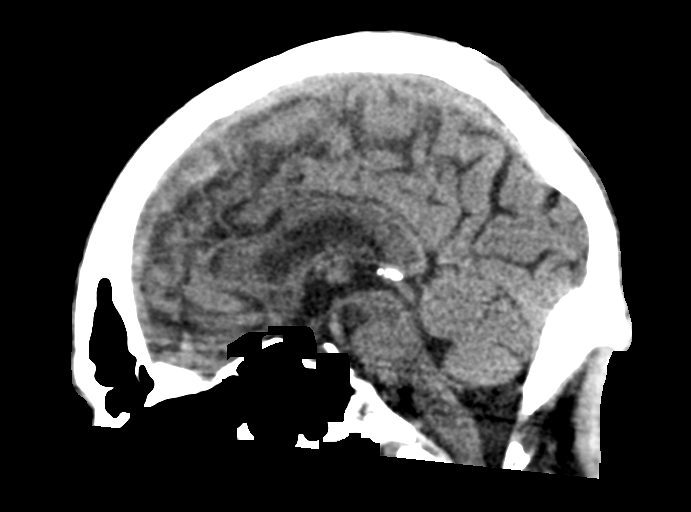
[im 35/53  brain]
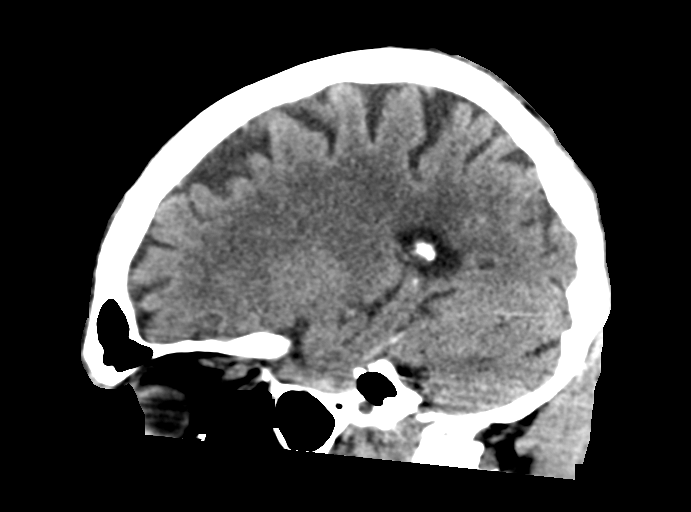

[15 of 46 positions shown; findings below may reference images not displayed]

FINDINGS: Brain: The brainstem, cerebellum, cerebral peduncles, thalami, basal
ganglia, basilar cisterns, and ventricular system appear within
normal limits. No intracranial hemorrhage, mass lesion, or acute
CVA. Periventricular white matter and corona radiata hypodensities
favor chronic ischemic microvascular white matter disease.

Vascular: Unremarkable

Skull: Unremarkable

Sinuses/Orbits: Unremarkable

Other: No supplemental non-categorized findings.
IMPRESSION: 1. No acute intracranial findings.
2. Periventricular white matter and corona radiata hypodensities
favor chronic ischemic microvascular white matter disease.

## 2019-11-10 IMAGING — CR DG CHEST 2V
1 series · 2 of 2 positions shown · non-contrast
Comparison: 06/06/2018

CLINICAL DATA: Cough, congestion, wheezing

EXAM:
CHEST - 2 VIEW

[Series 1: dg chest 2 view · 0.14mm/px · 2 of 2 slices shown]
[im 1/2]
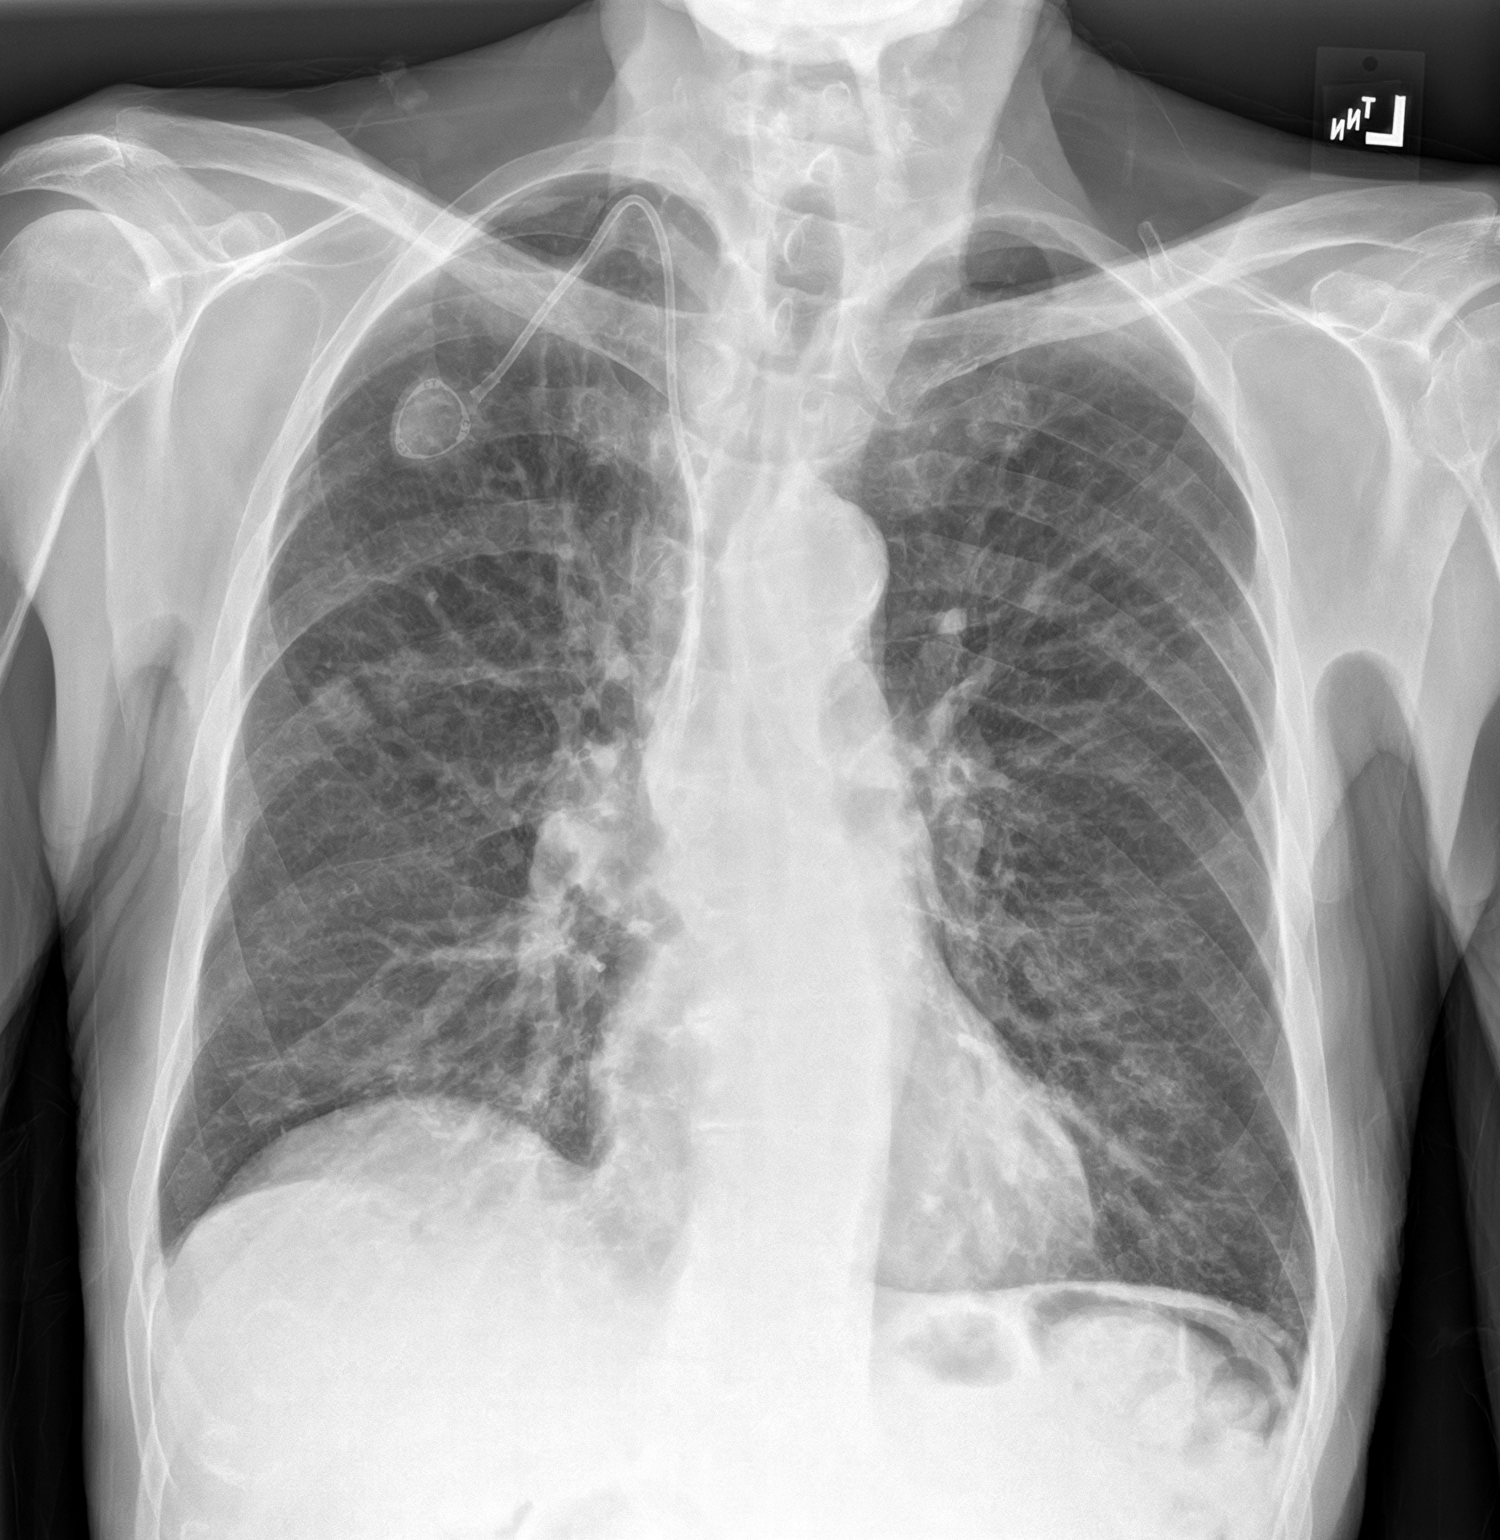
[im 2/2]
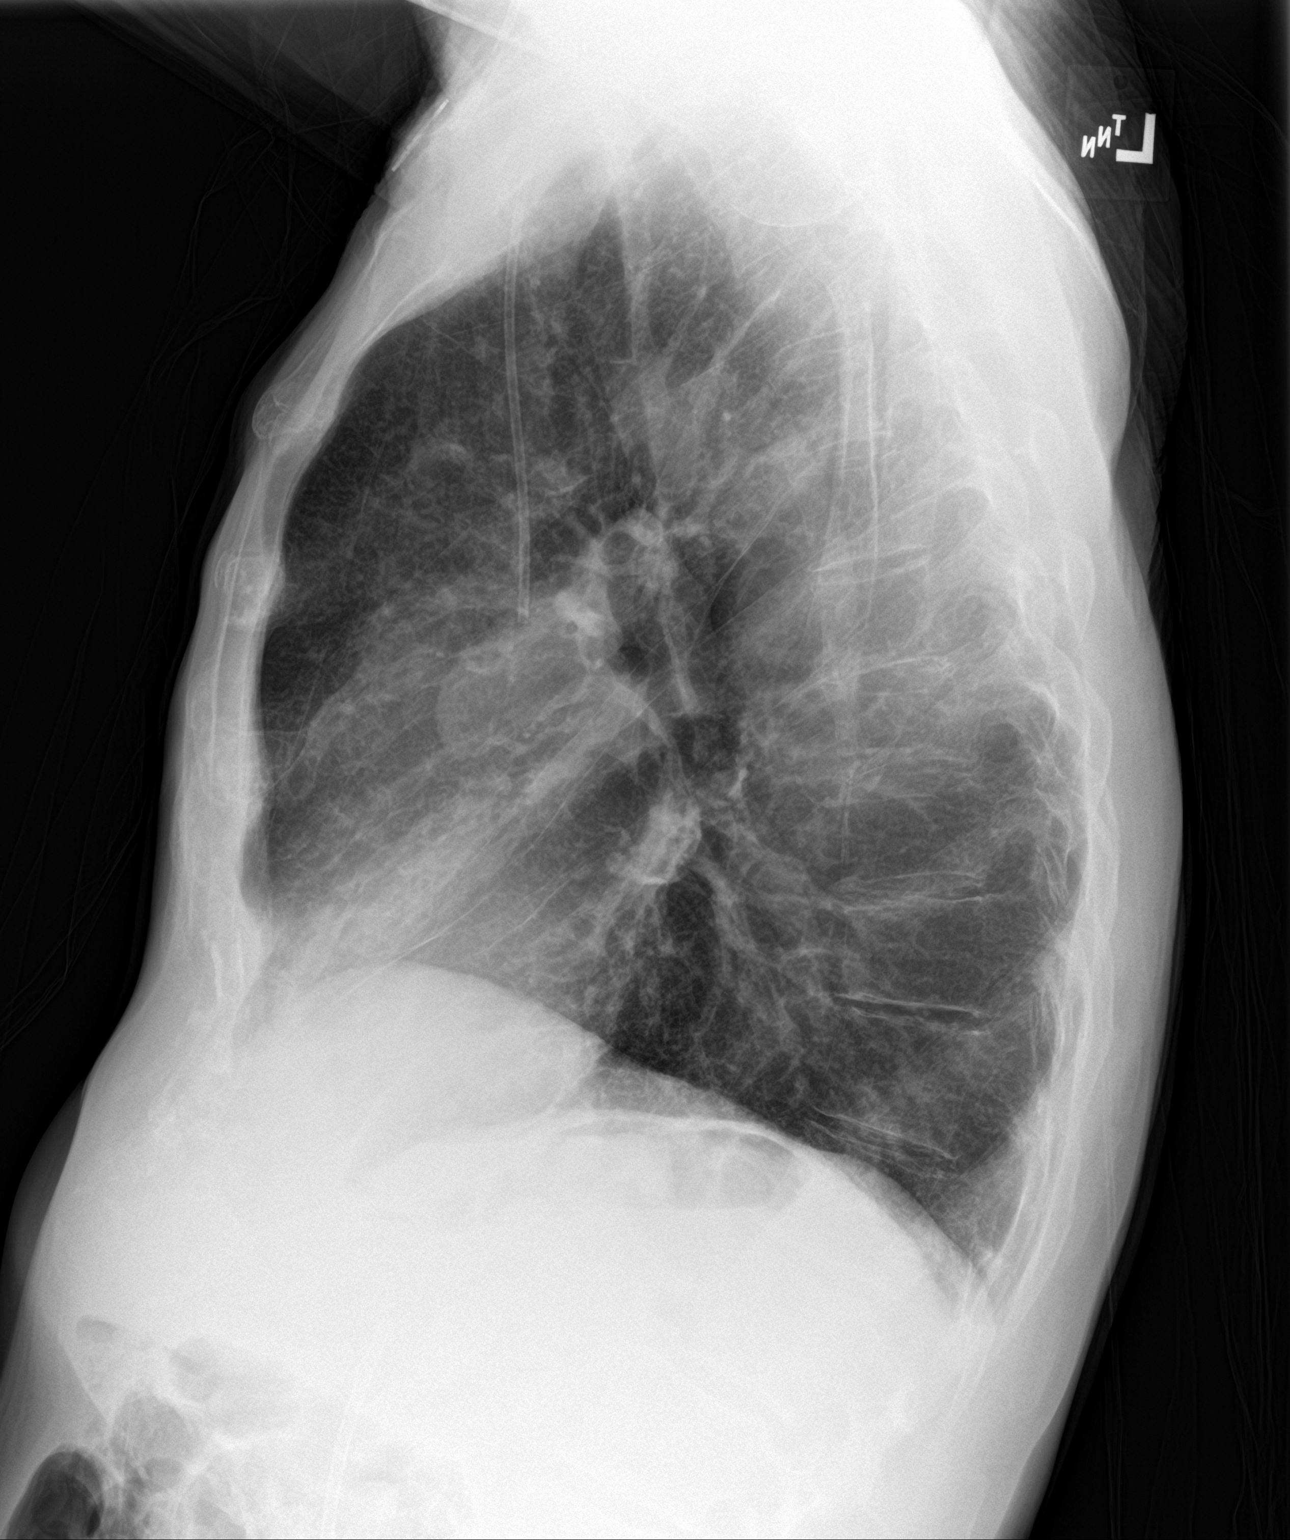

[2 of 2 positions shown; findings below may reference images not displayed]

FINDINGS: Pain right Port-A-Cath remains in place, unchanged. There is
hyperinflation of the lungs compatible with COPD. Right pulmonary
nodule again noted, stable. No confluent airspace opacities or
effusions. No acute bony abnormality.
IMPRESSION: COPD.  Stable right mid lung pulmonary nodule.  No active disease.
